# Patient Record
Sex: Male | Born: 1950 | Race: Black or African American | Hispanic: No | Marital: Married | Smoking: Never smoker
Health system: Southern US, Community
[De-identification: ages and names within clinical notes are randomized; demographics above are authoritative.]

## PROBLEM LIST (undated history)

## (undated) DIAGNOSIS — K219 Gastro-esophageal reflux disease without esophagitis: Secondary | ICD-10-CM

## (undated) DIAGNOSIS — M549 Dorsalgia, unspecified: Secondary | ICD-10-CM

## (undated) DIAGNOSIS — H269 Unspecified cataract: Secondary | ICD-10-CM

## (undated) DIAGNOSIS — G4730 Sleep apnea, unspecified: Secondary | ICD-10-CM

## (undated) DIAGNOSIS — F528 Other sexual dysfunction not due to a substance or known physiological condition: Secondary | ICD-10-CM

## (undated) DIAGNOSIS — Z9189 Other specified personal risk factors, not elsewhere classified: Secondary | ICD-10-CM

## (undated) DIAGNOSIS — T7840XA Allergy, unspecified, initial encounter: Secondary | ICD-10-CM

## (undated) DIAGNOSIS — N400 Enlarged prostate without lower urinary tract symptoms: Secondary | ICD-10-CM

## (undated) DIAGNOSIS — G709 Myoneural disorder, unspecified: Secondary | ICD-10-CM

## (undated) DIAGNOSIS — M129 Arthropathy, unspecified: Secondary | ICD-10-CM

## (undated) DIAGNOSIS — I10 Essential (primary) hypertension: Secondary | ICD-10-CM

## (undated) DIAGNOSIS — M1710 Unilateral primary osteoarthritis, unspecified knee: Secondary | ICD-10-CM

## (undated) DIAGNOSIS — J309 Allergic rhinitis, unspecified: Secondary | ICD-10-CM

## (undated) DIAGNOSIS — J019 Acute sinusitis, unspecified: Secondary | ICD-10-CM

## (undated) DIAGNOSIS — J029 Acute pharyngitis, unspecified: Secondary | ICD-10-CM

## (undated) DIAGNOSIS — E785 Hyperlipidemia, unspecified: Secondary | ICD-10-CM

## (undated) DIAGNOSIS — M542 Cervicalgia: Secondary | ICD-10-CM

## (undated) HISTORY — DX: Myoneural disorder, unspecified: G70.9

## (undated) HISTORY — DX: Allergy, unspecified, initial encounter: T78.40XA

## (undated) HISTORY — DX: Gastro-esophageal reflux disease without esophagitis: K21.9

## (undated) HISTORY — PX: UPPER GASTROINTESTINAL ENDOSCOPY: SHX188

## (undated) HISTORY — DX: Acute pharyngitis, unspecified: J02.9

## (undated) HISTORY — DX: Unspecified cataract: H26.9

## (undated) HISTORY — DX: Dorsalgia, unspecified: M54.9

## (undated) HISTORY — DX: Cervicalgia: M54.2

## (undated) HISTORY — DX: Essential (primary) hypertension: I10

## (undated) HISTORY — DX: Unilateral primary osteoarthritis, unspecified knee: M17.10

## (undated) HISTORY — DX: Allergic rhinitis, unspecified: J30.9

## (undated) HISTORY — DX: Sleep apnea, unspecified: G47.30

## (undated) HISTORY — DX: Hyperlipidemia, unspecified: E78.5

## (undated) HISTORY — PX: COLONOSCOPY: SHX174

## (undated) HISTORY — DX: Other sexual dysfunction not due to a substance or known physiological condition: F52.8

## (undated) HISTORY — DX: Benign prostatic hyperplasia without lower urinary tract symptoms: N40.0

## (undated) HISTORY — DX: Other specified personal risk factors, not elsewhere classified: Z91.89

## (undated) HISTORY — PX: POLYPECTOMY: SHX149

## (undated) HISTORY — DX: Arthropathy, unspecified: M12.9

## (undated) HISTORY — DX: Acute sinusitis, unspecified: J01.90

---

## 1992-03-07 HISTORY — PX: OTHER SURGICAL HISTORY: SHX169

## 2002-03-07 HISTORY — PX: OTHER SURGICAL HISTORY: SHX169

## 2004-03-07 HISTORY — PX: GASTRIC BYPASS: SHX52

## 2006-03-07 HISTORY — PX: OTHER SURGICAL HISTORY: SHX169

## 2006-06-06 LAB — HM COLONOSCOPY: HM Colonoscopy: NORMAL

## 2007-08-07 ENCOUNTER — Ambulatory Visit: Payer: Self-pay | Admitting: Internal Medicine

## 2007-08-07 DIAGNOSIS — Z9189 Other specified personal risk factors, not elsewhere classified: Secondary | ICD-10-CM

## 2007-08-07 DIAGNOSIS — J309 Allergic rhinitis, unspecified: Secondary | ICD-10-CM

## 2007-08-07 DIAGNOSIS — M129 Arthropathy, unspecified: Secondary | ICD-10-CM | POA: Insufficient documentation

## 2007-08-07 DIAGNOSIS — E785 Hyperlipidemia, unspecified: Secondary | ICD-10-CM

## 2007-08-07 DIAGNOSIS — I10 Essential (primary) hypertension: Secondary | ICD-10-CM

## 2007-08-07 HISTORY — DX: Essential (primary) hypertension: I10

## 2007-08-07 HISTORY — DX: Arthropathy, unspecified: M12.9

## 2007-08-07 HISTORY — DX: Hyperlipidemia, unspecified: E78.5

## 2007-08-07 HISTORY — DX: Allergic rhinitis, unspecified: J30.9

## 2007-08-07 HISTORY — DX: Other specified personal risk factors, not elsewhere classified: Z91.89

## 2008-02-18 ENCOUNTER — Ambulatory Visit: Payer: Self-pay | Admitting: Internal Medicine

## 2008-02-18 DIAGNOSIS — F528 Other sexual dysfunction not due to a substance or known physiological condition: Secondary | ICD-10-CM

## 2008-02-18 HISTORY — DX: Other sexual dysfunction not due to a substance or known physiological condition: F52.8

## 2008-03-24 ENCOUNTER — Telehealth: Payer: Self-pay | Admitting: Internal Medicine

## 2008-05-20 ENCOUNTER — Ambulatory Visit: Payer: Self-pay | Admitting: Internal Medicine

## 2008-05-20 DIAGNOSIS — N400 Enlarged prostate without lower urinary tract symptoms: Secondary | ICD-10-CM

## 2008-05-20 HISTORY — DX: Benign prostatic hyperplasia without lower urinary tract symptoms: N40.0

## 2008-06-12 ENCOUNTER — Encounter: Payer: Self-pay | Admitting: Internal Medicine

## 2008-10-15 ENCOUNTER — Telehealth: Payer: Self-pay | Admitting: Internal Medicine

## 2009-01-02 ENCOUNTER — Ambulatory Visit: Payer: Self-pay | Admitting: Internal Medicine

## 2009-01-02 ENCOUNTER — Encounter (INDEPENDENT_AMBULATORY_CARE_PROVIDER_SITE_OTHER): Payer: Self-pay | Admitting: *Deleted

## 2009-01-02 LAB — CONVERTED CEMR LAB
ALT: 17 units/L (ref 0–53)
AST: 21 units/L (ref 0–37)
Alkaline Phosphatase: 65 units/L (ref 39–117)
Basophils Relative: 1.2 % (ref 0.0–3.0)
Bilirubin, Direct: 0.1 mg/dL (ref 0.0–0.3)
Chloride: 101 meq/L (ref 96–112)
Creatinine, Ser: 0.9 mg/dL (ref 0.4–1.5)
Eosinophils Relative: 1.9 % (ref 0.0–5.0)
Leukocytes, UA: NEGATIVE
Lymphocytes Relative: 17 % (ref 12.0–46.0)
MCV: 92.3 fL (ref 78.0–100.0)
Monocytes Absolute: 0.5 10*3/uL (ref 0.1–1.0)
Neutrophils Relative %: 71.8 % (ref 43.0–77.0)
Nitrite: NEGATIVE
Potassium: 4 meq/L (ref 3.5–5.1)
RBC: 5.09 M/uL (ref 4.22–5.81)
Specific Gravity, Urine: 1.02 (ref 1.000–1.030)
TSH: 1.07 microintl units/mL (ref 0.35–5.50)
Total Protein: 6.9 g/dL (ref 6.0–8.3)
WBC: 6.5 10*3/uL (ref 4.5–10.5)
pH: 6.5 (ref 5.0–8.0)

## 2009-01-26 ENCOUNTER — Ambulatory Visit: Payer: Self-pay | Admitting: Internal Medicine

## 2009-01-26 DIAGNOSIS — J029 Acute pharyngitis, unspecified: Secondary | ICD-10-CM

## 2009-01-26 HISTORY — DX: Acute pharyngitis, unspecified: J02.9

## 2009-01-30 ENCOUNTER — Telehealth: Payer: Self-pay | Admitting: Internal Medicine

## 2009-04-07 ENCOUNTER — Telehealth: Payer: Self-pay | Admitting: Internal Medicine

## 2009-08-28 ENCOUNTER — Telehealth: Payer: Self-pay | Admitting: Internal Medicine

## 2009-09-15 ENCOUNTER — Ambulatory Visit: Payer: Self-pay | Admitting: Internal Medicine

## 2009-09-15 DIAGNOSIS — M549 Dorsalgia, unspecified: Secondary | ICD-10-CM | POA: Insufficient documentation

## 2009-09-15 DIAGNOSIS — M542 Cervicalgia: Secondary | ICD-10-CM

## 2009-09-15 DIAGNOSIS — IMO0002 Reserved for concepts with insufficient information to code with codable children: Secondary | ICD-10-CM

## 2009-09-15 DIAGNOSIS — M1710 Unilateral primary osteoarthritis, unspecified knee: Secondary | ICD-10-CM

## 2009-09-15 HISTORY — DX: Cervicalgia: M54.2

## 2009-09-15 HISTORY — DX: Reserved for concepts with insufficient information to code with codable children: IMO0002

## 2009-09-15 HISTORY — DX: Dorsalgia, unspecified: M54.9

## 2009-12-11 ENCOUNTER — Ambulatory Visit: Payer: Self-pay | Admitting: Internal Medicine

## 2009-12-11 LAB — CONVERTED CEMR LAB
ALT: 20 units/L (ref 0–53)
AST: 22 units/L (ref 0–37)
Alkaline Phosphatase: 56 units/L (ref 39–117)
BUN: 16 mg/dL (ref 6–23)
Basophils Relative: 0.3 % (ref 0.0–3.0)
Bilirubin Urine: NEGATIVE
Chloride: 108 meq/L (ref 96–112)
Eosinophils Absolute: 0.1 10*3/uL (ref 0.0–0.7)
Eosinophils Relative: 2.9 % (ref 0.0–5.0)
Glucose, Bld: 73 mg/dL (ref 70–99)
HDL: 46.4 mg/dL (ref >39.00)
Hemoglobin, Urine: NEGATIVE
Leukocytes, UA: NEGATIVE
Lymphocytes Relative: 22.8 % (ref 12.0–46.0)
Neutrophils Relative %: 64.3 % (ref 43.0–77.0)
Nitrite: NEGATIVE
PSA: 0.5 ng/mL (ref 0.10–4.00)
Platelets: 181 10*3/uL (ref 150.0–400.0)
Potassium: 4.2 meq/L (ref 3.5–5.1)
RBC: 4.63 M/uL (ref 4.22–5.81)
Sodium: 144 meq/L (ref 135–145)
TSH: 0.62 microintl units/mL (ref 0.35–5.50)
Total Bilirubin: 0.8 mg/dL (ref 0.3–1.2)
Total CHOL/HDL Ratio: 4
Total Protein, Urine: NEGATIVE mg/dL
Urobilinogen, UA: 0.2 (ref 0.0–1.0)
VLDL: 14.2 mg/dL (ref 0.0–40.0)
WBC: 4.9 10*3/uL (ref 4.5–10.5)

## 2009-12-18 ENCOUNTER — Ambulatory Visit: Payer: Self-pay | Admitting: Internal Medicine

## 2009-12-18 ENCOUNTER — Encounter: Payer: Self-pay | Admitting: Internal Medicine

## 2010-01-01 ENCOUNTER — Telehealth: Payer: Self-pay | Admitting: Internal Medicine

## 2010-04-06 NOTE — Progress Notes (Signed)
Summary: Rx req  Phone Note Refill Request Message from:  Fax from Pharmacy on January 01, 2010 4:17 PM  Refills Requested: Medication #1:  ZOCOR 40 MG TABS 1 by mouth once daily   Dosage confirmed as above?Dosage Confirmed   Supply Requested: 9 months Original Rx was not recieved   Method Requested: Electronic Initial call taken by: Margaret Pyle, CMA,  January 01, 2010 4:17 PM    Prescriptions: ZOCOR 40 MG TABS (SIMVASTATIN) 1 by mouth once daily  #90 x 2   Entered by:   Margaret Pyle, CMA   Authorized by:   Corwin Levins MD   Signed by:   Margaret Pyle, CMA on 01/01/2010   Method used:   Faxed to ...       [facility]       [address] [place], [place]  [address]       Ph: [phone]       Fax: [phone]   RxID:   [id number]

## 2010-04-06 NOTE — Assessment & Plan Note (Signed)
Summary: Joshua Burnett  #   Vital Signs:  Patient profile:   60 year old male Height:      71.5 inches Weight:      238 pounds BMI:     32.85 O2 Sat:      94 % on Room air Temp:     97.1 degrees F oral Pulse rate:   72 / minute BP sitting:   134 / 76  (left arm) Cuff size:   large  Vitals Entered By: Zella Ball Ewing CMA (AAMA) (December 18, 2009 1:11 PM)  O2 Flow:  Room air  Preventive Care Screening     declines tetanus  CC: Adult Physical/RE   Primary Care Provider:  Corwin Levins MD  CC:  Adult Physical/RE.  History of Present Illness: here for wellness; overall doing well;  Pt denies CP, worsening sob, doe, wheezing, orthopnea, pnd, worsening LE edema, palps, dizziness or syncope  Pt denies new neuro symptoms such as headache, facial or extremity weakness  No fever, wt loss, night sweats, loss of appetite or other constitutional symptoms  Pt denies polydipsia, polyuria.  Overall good compliance with meds, trying to follow low chol diet, wt stable, little excercise however .  Wants flu shot.    Preventive Screening-Counseling & Management      Drug Use:  no.    Problems Prior to Update: 1)  Neck Pain, Right  (ICD-723.1) 2)  Back Pain  (ICD-724.5) 3)  Osteoarthritis, Knees, Bilateral  (ICD-715.96) 4)  Acute Pharyngitis  (ICD-462) 5)  Benign Prostatic Hypertrophy  (ICD-600.00) 6)  Erectile Dysfunction  (ICD-302.72) 7)  Preventive Health Care  (ICD-V70.0) 8)  Eating Disorder, Hx of  (ICD-V15.89) 9)  Arthritis  (ICD-716.90) 10)  Hypertension  (ICD-401.9) 11)  Hyperlipidemia  (ICD-272.4) 12)  Allergic Rhinitis  (ICD-477.9)  Medications Prior to Update: 1)  Clarinex 5 Mg Tabs (Desloratadine) .Marland Kitchen.. 1po Once Daily 2)  Flintstones Complete   Chew (Pediatric Multivit-Minerals-C) .Marland Kitchen.. 1 By Mouth Qd 3)  Tylenol Arthritis Pain 650 Mg  Tbcr (Acetaminophen) .... 2 Tabs By Mouth Qd 4)  Cialis 20 Mg  Tabs (Tadalafil) .Marland Kitchen.. 1 By Mouth Daily As Needed Once For Erectile Dysfunction 5)   Ambien Cr 12.5 Mg  Tbcr (Zolpidem Tartrate) .Marland Kitchen.. 1 By Mouth As Needed Qhs 6)  Adult Aspirin Ec Low Strength 81 Mg  Tbec (Aspirin) .Marland Kitchen.. 1po Once Daily 7)  Mucinex Dm 30-600 Mg Xr12h-Tab (Dextromethorphan-Guaifenesin) .... Use Prn 8)  Robitussin Night Cgh/cold/flu 2.5-1-5-160 Mg/70ml Susp (Phenyleph-Cpm-Dm-Apap) .... As Needed 9)  Zocor 40 Mg Tabs (Simvastatin) .Marland Kitchen.. 1 By Mouth Once Daily 10)  Tramadol Hcl 50 Mg Tabs (Tramadol Hcl) .Marland Kitchen.. 1p By Mouth Q 6 Hrs As Needed Pain 11)  Flexeril 5 Mg Tabs (Cyclobenzaprine Hcl) .Marland Kitchen.. 1 By Mouth Three Times A Day As Needed  Current Medications (verified): 1)  Clarinex 5 Mg Tabs (Desloratadine) .Marland Kitchen.. 1po Once Daily 2)  Flintstones Complete   Chew (Pediatric Multivit-Minerals-C) .Marland Kitchen.. 1 By Mouth Qd 3)  Tylenol Arthritis Pain 650 Mg  Tbcr (Acetaminophen) .... 2 Tabs By Mouth Qd 4)  Cialis 20 Mg  Tabs (Tadalafil) .Marland Kitchen.. 1 By Mouth Daily As Needed Once For Erectile Dysfunction 5)  Ambien Cr 12.5 Mg  Tbcr (Zolpidem Tartrate) .Marland Kitchen.. 1 By Mouth As Needed Qhs 6)  Adult Aspirin Ec Low Strength 81 Mg  Tbec (Aspirin) .Marland Kitchen.. 1po Once Daily 7)  Mucinex Dm 30-600 Mg Xr12h-Tab (Dextromethorphan-Guaifenesin) .... Use Prn 8)  Robitussin Night Cgh/cold/flu 2.5-1-5-160 Mg/1ml Susp (Phenyleph-Cpm-Dm-Apap) .Marland KitchenMarland KitchenMarland Kitchen  As Needed 9)  Zocor 40 Mg Tabs (Simvastatin) .Marland Kitchen.. 1 By Mouth Once Daily 10)  Tramadol Hcl 50 Mg Tabs (Tramadol Hcl) .Marland Kitchen.. 1p By Mouth Q 6 Hrs As Needed Pain 11)  Flexeril 5 Mg Tabs (Cyclobenzaprine Hcl) .Marland Kitchen.. 1 By Mouth Three Times A Day As Needed  Allergies (verified): 1)  ! 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 Allergic rhinitis Hyperlipidemia Hypertension DJD hx of OSA - no tx now lumbar disc disease E.D. 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 father with elev chol, heart disease, HTN mother with DJD  Social History: Reviewed history from 08/07/2007 and no changes required. 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         all otherwise negative per pt -    Physical Exam  General:  alert and overweight-appearing.   Head:  normocephalic and atraumatic.   Eyes:  vision grossly intact, pupils equal, and pupils round.   Ears:  R ear normal and L ear normal.   Nose:  no external deformity and no nasal discharge.   Mouth:  no gingival abnormalities and pharynx pink and moist.   Neck:  supple and no masses.   Lungs:  normal respiratory effort and normal breath sounds.   Heart:  normal rate and regular rhythm.   Abdomen:  soft, non-tender, and normal bowel sounds.   Msk:  no joint tenderness and no joint swelling.   Extremities:  no edema, no erythema  Neurologic:  cranial nerves II-XII intact, strength normal in all extremities, sensation intact to pinprick, gait normal, and DTRs  symmetrical and normal.   Skin:  color normal and no rashes.   Psych:  not anxious appearing and not depressed appearing.     Impression & Recommendations:  Problem # 1:  Preventive Health Care (ICD-V70.0) Overall doing well, age appropriate education and counseling updated, referral for preventive services and immunizations addressed, dietary counseling and smoking status adressed , most recent labs reviewed with pt, ecg reviewed Orders: EKG w/ Interpretation (93000) I have personally reviewed and have noted 1.   The patient's medical and social history 2.   Their use of alcohol, tobacco or illicit drugs 3.   Their current medications and supplements 4.   The patient's functional ability including ADL's, fall risks, home safety risks and hearing or visual             impairment. 5.   Diet and physical activities 6.   Evidence for depression or mood disorders  The patients weight, height, BMI have been recorded in the chart I have made  referrals, counseling and provided education to the patient based review of the above  Problem # 2:  HYPERLIPIDEMIA (ICD-272.4)  His updated medication list for this problem includes:    Zocor 40 Mg Tabs (Simvastatin) .Marland Kitchen... 1 by mouth once daily  Labs Reviewed: SGOT: 22 (12/11/2009)   SGPT: 20 (12/11/2009)   HDL:46.40 (12/11/2009), 51.40 (01/02/2009)  LDL:103 (12/11/2009)  Chol:164 (12/11/2009), 220 (01/02/2009)  Trig:71.0 (12/11/2009), 83.0 (01/02/2009) stable overall by hx and exam, ok to continue meds/tx as is , to follow better lower chol diet  Complete Medication List: 1)  Clarinex 5 Mg Tabs (Desloratadine) .Marland Kitchen.. 1po once daily 2)  Flintstones Complete Chew (Pediatric multivit-minerals-c) .Marland Kitchen.. 1 by mouth qd 3)  Tylenol Arthritis Pain 650 Mg Tbcr (Acetaminophen) .... 2 tabs by mouth qd 4)  Cialis 20 Mg Tabs (Tadalafil) .Marland Kitchen.. 1 by mouth daily as needed once for erectile dysfunction 5)  Ambien Cr 12.5 Mg Tbcr (Zolpidem tartrate) .Marland Kitchen.. 1 by mouth as  needed qhs 6)  Adult Aspirin Ec Low Strength 81 Mg Tbec (Aspirin) .Marland Kitchen.. 1po once daily 7)  Mucinex Dm 30-600 Mg Xr12h-tab (Dextromethorphan-guaifenesin) .... Use prn 8)  Robitussin Night Cgh/cold/flu 2.5-1-5-160 Mg/41ml Susp (Phenyleph-cpm-dm-apap) .... As needed 9)  Zocor 40 Mg Tabs (Simvastatin) .Marland Kitchen.. 1 by mouth once daily 10)  Tramadol Hcl 50 Mg Tabs (Tramadol hcl) .Marland Kitchen.. 1p by mouth q 6 hrs as needed pain 11)  Flexeril 5 Mg Tabs (Cyclobenzaprine hcl) .Marland Kitchen.. 1 by mouth three times a day as needed  Other Orders: Admin 1st Vaccine (16109) Flu Vaccine 63yrs + [phone])  Patient Instructions: 1)  you had the flu shot today 2)  you had the tetanus shot today 3)  Continue all previous medications as before this visit  4)  Please follow low cholesterol diet 5)  Please schedule a follow-up appointment in 1 year, or sooner if needed   Flu Vaccine Consent Questions     Do you have a history of severe allergic reactions to this vaccine? no    Any prior history of allergic reactions to egg and/or gelatin? no    Do you have a sensitivity to the preservative Thimersol? no    Do you have a past history of Guillan-Barre Syndrome? no    Do you currently have an acute febrile illness? no    Have you ever had a severe reaction to latex? no    Vaccine information given and explained to patient? yes    Are you currently pregnant? no    Lot Number:[id number]   Exp Date:09/04/2010   Site Given  Left Deltoid [id number]

## 2010-04-06 NOTE — Progress Notes (Signed)
  Phone Note Refill Request Message from:  Fax from Pharmacy on August 28, 2009 9:42 AM  Refills Requested: Medication #1:  CLARINEX 5 MG TABS 1po once daily   Dosage confirmed as above?Dosage Confirmed   Notes: [facility] Initial call taken by: Robin Ewing CMA Duncan Dull),  August 28, 2009 9:42 AM    Prescriptions: CLARINEX 5 MG TABS (DESLORATADINE) 1po once daily  #90 x 0   Entered by:   Scharlene Gloss CMA (AAMA)   Authorized by:   Corwin Levins MD   Signed by:   Scharlene Gloss CMA (AAMA) on 08/28/2009   Method used:   Faxed to ...       [facility]       [address] [place], [place]  [address]       Ph: [phone]       Fax: [phone]   RxID:   [id number]

## 2010-04-06 NOTE — Assessment & Plan Note (Signed)
Summary: BACK PROBLEMS/NWS  #   Vital Signs:  Patient profile:   60 year old male Height:      71.5 inches Weight:      232.50 pounds BMI:     32.09 O2 Sat:      94 % on Room air Temp:     98.4 degrees F oral Pulse rate:   67 / minute BP sitting:   122 / 78  (left arm) Cuff size:   large  Vitals Entered By: Zella Ball Ewing CMA Duncan Dull) (September 15, 2009 2:27 PM)  O2 Flow:  Room air CC: Back problems, shoulder and neck stiffness/RE   Primary Care Provider:  Corwin Levins MD  CC:  Back problems and shoulder and neck stiffness/RE.  History of Present Illness: here to f/u;  has hx of back surgury lumbar, and known other slight bulging disc per pt -= ? l5; overall worse in the last 2 mo, usually low lubmar just to the right center; usually mild to mod, intermittent;  often occurs worse to first get up in the am, seems better after about ,  no LE pain, weak,  numb;  no change in bowel or bladder, and no gait change, no falls, fever, wt loss, night sweats or injury.    also with stiffness to the right neck for 5 months; not present ot look forward, but turning horiz to the left as well touching right ear to shoulder makes it worse;  no RUE pain, weak, numb; no fall or injury o/w.    also c/o right distal foot pain at the first mtp to flexion under pressure (leaning forward with wt on the first mtp);  no injury, swelling or redness, mild to mod , intermittent for 3 to 4 months, has bunion at the site but no tenderness  also with left dorsal foot pain just prox to the first mtp, wihtout sweling, rednes, injury or truama. N  and left knee stiffness worse with pain to pivot one month ago, gradually better over one mo, but still somewhat discomfort;  he is unaware of specific swelling;  no falls or injury.    no fever, hx of gout. Pt denies CP, sob, doe, wheezing, orthopnea, pnd, worsening LE edema, palps, dizziness or syncope  Pt denies new neuro symptoms such as headache, facial or extremity  weakness   Problems Prior to Update: 1)  Back Pain  (ICD-724.5) 2)  Osteoarthritis, Knees, Bilateral  (ICD-715.96) 3)  Acute Pharyngitis  (ICD-462) 4)  Benign Prostatic Hypertrophy  (ICD-600.00) 5)  Erectile Dysfunction  (ICD-302.72) 6)  Preventive Health Care  (ICD-V70.0) 7)  Eating Disorder, Hx of  (ICD-V15.89) 8)  Arthritis  (ICD-716.90) 9)  Hypertension  (ICD-401.9) 10)  Hyperlipidemia  (ICD-272.4) 11)  Allergic Rhinitis  (ICD-477.9)  Medications Prior to Update: 1)  Clarinex 5 Mg Tabs (Desloratadine) .Marland Kitchen.. 1po Once Daily 2)  Flintstones Complete   Chew (Pediatric Multivit-Minerals-C) .Marland Kitchen.. 1 By Mouth Qd 3)  Tylenol Arthritis Pain 650 Mg  Tbcr (Acetaminophen) .... 2 Tabs By Mouth Qd 4)  Cialis 20 Mg  Tabs (Tadalafil) .Marland Kitchen.. 1 By Mouth Daily As Needed Once For Erectile Dysfunction 5)  Ambien Cr 12.5 Mg  Tbcr (Zolpidem Tartrate) .Marland Kitchen.. 1 By Mouth As Needed Qhs 6)  Adult Aspirin Ec Low Strength 81 Mg  Tbec (Aspirin) .Marland Kitchen.. 1po Once Daily 7)  Promethazine Hcl 25 Mg Tabs (Promethazine Hcl) .Marland Kitchen.. 1po Q 6 Hrs As Needed Nausea 8)  Mucinex Dm 30-600 Mg Xr12h-Tab (Dextromethorphan-Guaifenesin) .Marland KitchenMarland KitchenMarland Kitchen  Use Prn 9)  Robitussin Night Cgh/cold/flu 2.5-1-5-160 Mg/27ml Susp (Phenyleph-Cpm-Dm-Apap) .... As Needed 10)  Zocor 40 Mg Tabs (Simvastatin) .Marland Kitchen.. 1 By Mouth Once Daily  Current Medications (verified): 1)  Clarinex 5 Mg Tabs (Desloratadine) .Marland Kitchen.. 1po Once Daily 2)  Flintstones Complete   Chew (Pediatric Multivit-Minerals-C) .Marland Kitchen.. 1 By Mouth Qd 3)  Tylenol Arthritis Pain 650 Mg  Tbcr (Acetaminophen) .... 2 Tabs By Mouth Qd 4)  Cialis 20 Mg  Tabs (Tadalafil) .Marland Kitchen.. 1 By Mouth Daily As Needed Once For Erectile Dysfunction 5)  Ambien Cr 12.5 Mg  Tbcr (Zolpidem Tartrate) .Marland Kitchen.. 1 By Mouth As Needed Qhs 6)  Adult Aspirin Ec Low Strength 81 Mg  Tbec (Aspirin) .Marland Kitchen.. 1po Once Daily 7)  Mucinex Dm 30-600 Mg Xr12h-Tab (Dextromethorphan-Guaifenesin) .... Use Prn 8)  Robitussin Night Cgh/cold/flu 2.5-1-5-160 Mg/25ml Susp  (Phenyleph-Cpm-Dm-Apap) .... As Needed 9)  Zocor 40 Mg Tabs (Simvastatin) .Marland Kitchen.. 1 By Mouth Once Daily 10)  Tramadol Hcl 50 Mg Tabs (Tramadol Hcl) .Marland Kitchen.. 1p By Mouth Q 6 Hrs As Needed Pain 11)  Flexeril 5 Mg Tabs (Cyclobenzaprine Hcl) .Marland Kitchen.. 1 By Mouth Three Times A Day As Needed  Allergies (verified): 1)  ! Ace Inhibitors  Past History:  Past Medical History: Last updated: 01/02/2009 Allergic rhinitis Hyperlipidemia Hypertension DJD hx of OSA - no tx now lumbar disc disease E.D. 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   Head:  normocephalic and atraumatic.   Eyes:  vision grossly intact, pupils equal, and pupils round.   Ears:  R ear normal and L ear normal.   Nose:  no external deformity and no nasal discharge.   Mouth:  no gingival abnormalities and pharynx pink and moist.   Neck:  supple and no masses.   Lungs:  normal respiratory effort and normal breath sounds.   Heart:  normal rate and regular rhythm.   Abdomen:  soft, non-tender, and normal bowel sounds.   Msk:  mild tender left trapezoid;  mild tender right postlat neck musculature wtihout spasm,  spine nontender throughout except for right paravertebral lowest lumbar area and right buttock/SI joint area;  left knee nontender but 1-2+ effusion and severe crepitus, mod crepitus to right knee;  bilat feet without tender/red/swelling;   Extremities:  no edema, no erythema  Neurologic:  cranial nerves II-XII intact, strength normal in  all extremities, sensation intact to pinprick, gait normal, and DTRs symmetrical and normal.   Skin:  color normal and no rashes.   Psych:  moderately anxious.     Impression & Recommendations:  Problem # 1:  OSTEOARTHRITIS, KNEES, BILATERAL (ICD-715.96)  His updated medication list for this problem includes:    Tylenol Arthritis Pain 650 Mg Tbcr (Acetaminophen) .Marland Kitchen... 2 tabs by mouth qd    Adult Aspirin Ec Low Strength 81 Mg Tbec (Aspirin) .Marland Kitchen... 1po once daily    Tramadol Hcl 50 Mg Tabs (Tramadol hcl) .Marland Kitchen... 1p by mouth q 6 hrs as needed pain    Flexeril 5 Mg Tabs (Cyclobenzaprine hcl) .Marland Kitchen... 1 by mouth three times a day as  needed with mod pain and effusion on the left - for tramadol, consider ortho consult if not improved  Problem # 2:  BACK PAIN (ICD-724.5)  His updated medication list for this problem includes:    Tylenol Arthritis Pain 650 Mg Tbcr (Acetaminophen) .Marland Kitchen... 2 tabs by mouth qd    Adult Aspirin Ec Low Strength 81 Mg Tbec (Aspirin) .Marland Kitchen... 1po once daily    Tramadol Hcl 50 Mg Tabs (Tramadol hcl) .Marland Kitchen... 1p by mouth q 6 hrs as needed pain    Flexeril 5 Mg Tabs (Cyclobenzaprine hcl) .Marland Kitchen... 1 by mouth three times a day as needed treat as above, f/u any worsening signs or symptoms ; suspect underlying lower lumbar DJD/DDD   Problem # 3:  NECK PAIN, RIGHT (ICD-723.1)  His updated medication list for this problem includes:    Tylenol Arthritis Pain 650 Mg Tbcr (Acetaminophen) .Marland Kitchen... 2 tabs by mouth qd    Adult Aspirin Ec Low Strength 81 Mg Tbec (Aspirin) .Marland Kitchen... 1po once daily    Tramadol Hcl 50 Mg Tabs (Tramadol hcl) .Marland Kitchen... 1p by mouth q 6 hrs as needed pain    Flexeril 5 Mg Tabs (Cyclobenzaprine hcl) .Marland Kitchen... 1 by mouth three times a day as needed simllar to right lower back - c/w msk strain and/or underlying flare of DJD/DDD, treat as above, f/u any worsening signs or symptoms   Problem # 4:  HYPERTENSION (ICD-401.9)  BP today: 122/78 Prior BP: 118/82 (01/26/2009)  Labs  Reviewed: K+: 4.0 (01/02/2009) Creat: : 0.9 (01/02/2009)   Chol: 220 (01/02/2009)   HDL: 51.40 (01/02/2009)   TG: 83.0 (01/02/2009) stable overall by hx and exam, ok to continue meds/tx as is  - no meds needed at this time, cont diet/low salt  Complete Medication List: 1)  Clarinex 5 Mg Tabs (Desloratadine) .Marland Kitchen.. 1po once daily 2)  Flintstones Complete Chew (Pediatric multivit-minerals-c) .Marland Kitchen.. 1 by mouth qd 3)  Tylenol Arthritis Pain 650 Mg Tbcr (Acetaminophen) .... 2 tabs by mouth qd 4)  Cialis 20 Mg Tabs (Tadalafil) .Marland Kitchen.. 1 by mouth daily as needed once for erectile dysfunction 5)  Ambien Cr 12.5 Mg Tbcr (Zolpidem tartrate) .Marland Kitchen.. 1 by mouth as needed qhs 6)  Adult Aspirin Ec Low Strength 81 Mg Tbec (Aspirin) .Marland Kitchen.. 1po once daily 7)  Mucinex Dm 30-600 Mg Xr12h-tab (Dextromethorphan-guaifenesin) .... Use prn 8)  Robitussin Night Cgh/cold/flu 2.5-1-5-160 Mg/71ml Susp (Phenyleph-cpm-dm-apap) .... As needed 9)  Zocor 40 Mg Tabs (Simvastatin) .Marland Kitchen.. 1 by mouth once daily 10)  Tramadol Hcl 50 Mg Tabs (Tramadol hcl) .Marland Kitchen.. 1p by mouth q 6 hrs as needed pain 11)  Flexeril 5 Mg Tabs (Cyclobenzaprine hcl) .Marland Kitchen.. 1 by mouth three times a day as needed  Patient Instructions: 1)  Please take all new medications as prescribed 2)  Continue all previous medications as before this visit  3)  Please schedule a follow-up appointment in 3 months. with CPX labs Prescriptions: FLEXERIL 5 MG TABS (CYCLOBENZAPRINE HCL) 1 by mouth three times a day as needed  #60 x 1   Entered and Authorized by:   Corwin Levins MD   Signed by:   Corwin Levins MD on 09/15/2009   Method used:   Print then Give to Patient   RxID:   [id number] TRAMADOL HCL 50 MG TABS (TRAMADOL HCL) 1p by mouth q 6 hrs as needed pain  #160 x 2   Entered and Authorized by:   Corwin Levins MD   Signed by:   Len Blalock  Jovee Dettinger MD on 09/15/2009   Method used:   Print then Give to Patient   RxID:   [id number] ZOCOR 40 MG TABS (SIMVASTATIN) 1 by mouth once  daily  #90 x 3   Entered and Authorized by:   Corwin Levins MD   Signed by:   Corwin Levins MD on 09/15/2009   Method used:   Print then Give to Patient   RxID:   [id number] AMBIEN CR 12.5 MG  TBCR (ZOLPIDEM TARTRATE) 1 by mouth as needed qhs  #30 x 5   Entered and Authorized by:   Corwin Levins MD   Signed by:   Corwin Levins MD on 09/15/2009   Method used:   Print then Give to Patient   RxID:   [id number] CIALIS 20 MG  TABS (TADALAFIL) 1 by mouth daily as needed once for erectile dysfunction  #10 x 11   Entered and Authorized by:   Corwin Levins MD   Signed by:   Corwin Levins MD on 09/15/2009   Method used:   Print then Give to Patient   RxID:   [id number] CIALIS 20 MG  TABS (TADALAFIL) 1 by mouth daily as needed once for erectile dysfunction  #5 x 11   Entered and Authorized by:   Corwin Levins MD   Signed by:   Corwin Levins MD on 09/15/2009   Method used:   Print then Give to Patient   RxID:   [id number] CLARINEX 5 MG TABS (DESLORATADINE) 1po once daily  #90 x 3   Entered and Authorized by:   Corwin Levins MD   Signed by:   Corwin Levins MD on 09/15/2009   Method used:   Print then Give to Patient   RxID:   [id number]

## 2010-04-06 NOTE — Progress Notes (Signed)
  Phone Note Refill Request  on April 07, 2009 9:31 AM  Refills Requested: Medication #1:  CIALIS 20 MG  TABS 1 by mouth daily as needed once for erectile dysfunction   Dosage confirmed as above?Dosage Confirmed   Notes: [facility] Initial call taken by: Scharlene Gloss,  April 07, 2009 9:31 AM    Prescriptions: CIALIS 20 MG  TABS (TADALAFIL) 1 by mouth daily as needed once for erectile dysfunction  #10 x 5   Entered by:   Scharlene Gloss   Authorized by:   Corwin Levins MD   Signed by:   Scharlene Gloss on 04/07/2009   Method used:   Faxed to ...       [facility]       [address] [place], [place]  [address]       Ph: [phone]       Fax: [phone]   RxID:   [id number]

## 2010-04-16 ENCOUNTER — Ambulatory Visit (INDEPENDENT_AMBULATORY_CARE_PROVIDER_SITE_OTHER): Payer: 59 | Admitting: Internal Medicine

## 2010-04-16 ENCOUNTER — Encounter: Payer: Self-pay | Admitting: Internal Medicine

## 2010-04-16 DIAGNOSIS — J209 Acute bronchitis, unspecified: Secondary | ICD-10-CM

## 2010-04-16 DIAGNOSIS — I10 Essential (primary) hypertension: Secondary | ICD-10-CM

## 2010-04-22 NOTE — Letter (Signed)
Summary: Out of Work  [facility]  [address] [place], [place] [address]   Phone: [phone]  Fax: [phone]    April 16, 2010   Employee:  Ross Ludwig    To Whom It May Concern:   For Medical reasons, please excuse the above named employee from work for the following dates:  Start:   04/16/10  End:   04/20/10  If you need additional information, please feel free to contact our office.         Sincerely,    Etta Grandchild MD

## 2010-04-22 NOTE — Assessment & Plan Note (Signed)
Summary: DR Sammuel Cooper PT NO SLOT--SORE THROAT-DRY NOSE PASSAGES--STC   Vital Signs:  Patient profile:   60 year old male Height:      71.5 inches Weight:      234 pounds BMI:     32.30 O2 Sat:      97 % on Room air Temp:     98.5 degrees F oral Pulse rate:   78 / minute Pulse rhythm:   regular Resp:     16 per minute BP sitting:   140 / 82  (left arm) Cuff size:   large  Vitals Entered By: Joshua Burnett CMA (April 16, 2010 1:54 PM)  Nutrition Counseling: Patient's BMI is greater than 25 and therefore counseled on weight management options.  O2 Flow:  Room air CC: Patient c/o sinus congestion, pain x 1wk, URI symptoms   Primary Care Provider:  Corwin Levins MD  CC:  Patient c/o sinus congestion, pain x 1wk, and URI symptoms.  History of Present Illness:  URI Symptoms      This is a 60 year old man who presents with URI symptoms.  The symptoms began 2 weeks ago.  The severity is described as mild.  The patient reports nasal congestion, purulent nasal discharge, sore throat, and dry cough, but denies productive cough, earache, and sick contacts.  Associated symptoms include low-grade fever (<100.5 degrees).  The patient denies stiff neck, dyspnea, wheezing, rash, vomiting, diarrhea, use of an antipyretic, and response to antipyretic.  The patient denies itchy throat, sneezing, seasonal symptoms, headache, muscle aches, and severe fatigue.  Risk factors for Strep sinusitis include unilateral facial pain, unilateral nasal discharge, and poor response to decongestant.  The patient denies the following risk factors for Strep sinusitis: double sickening, tooth pain, Strep exposure, tender adenopathy, and absence of cough.    Preventive Screening-Counseling & Management  Alcohol-Tobacco     Alcohol drinks/day: <1     Alcohol type: all     >5/day in last 3 mos: no     Alcohol Counseling: not indicated; use of alcohol is not excessive or problematic     Smoking Status: never     Tobacco  Counseling: not indicated; no tobacco use  Hep-HIV-STD-Contraception     Hepatitis Risk: no risk noted     HIV Risk: no risk noted     STD Risk: no risk noted      Drug Use:  no.    Clinical Review Panels:  Prevention   Last Colonoscopy:  normal (06/06/2006)   Last PSA:  0.50 (12/11/2009)  Immunizations   Last Flu Vaccine:  Fluvax 3+ (12/18/2009)  Lipid Management   Cholesterol:  164 (12/11/2009)   LDL (bad choesterol):  103 (12/11/2009)   HDL (good cholesterol):  46.40 (12/11/2009)  Diabetes Management   Creatinine:  0.7 (12/11/2009)   Last Flu Vaccine:  Fluvax 3+ (12/18/2009)  CBC   WBC:  4.9 (12/11/2009)   RBC:  4.63 (12/11/2009)   Hgb:  14.4 (12/11/2009)   Hct:  42.6 (12/11/2009)   Platelets:  181.0 (12/11/2009)   MCV  92.1 (12/11/2009)   MCHC  33.9 (12/11/2009)   RDW  13.0 (12/11/2009)   PMN:  64.3 (12/11/2009)   Lymphs:  22.8 (12/11/2009)   Monos:  9.7 (12/11/2009)   Eosinophils:  2.9 (12/11/2009)   Basophil:  0.3 (12/11/2009)  Complete Metabolic Panel   Glucose:  73 (12/11/2009)   Sodium:  144 (12/11/2009)   Potassium:  4.2 (12/11/2009)   Chloride:  108 (  12/11/2009)   CO2:  31 (12/11/2009)   BUN:  16 (12/11/2009)   Creatinine:  0.7 (12/11/2009)   Albumin:  3.5 (12/11/2009)   Total Protein:  5.9 (12/11/2009)   Calcium:  9.0 (12/11/2009)   Total Bili:  0.8 (12/11/2009)   Alk Phos:  56 (12/11/2009)   SGPT (ALT):  20 (12/11/2009)   SGOT (AST):  22 (12/11/2009)   Medications Prior to Update: 1)  Clarinex 5 Mg Tabs (Desloratadine) .Marland Kitchen.. 1po Once Daily 2)  Flintstones Complete   Chew (Pediatric Multivit-Minerals-C) .Marland Kitchen.. 1 By Mouth Qd 3)  Tylenol Arthritis Pain 650 Mg  Tbcr (Acetaminophen) .... 2 Tabs By Mouth Qd 4)  Cialis 20 Mg  Tabs (Tadalafil) .Marland Kitchen.. 1 By Mouth Daily As Needed Once For Erectile Dysfunction 5)  Ambien Cr 12.5 Mg  Tbcr (Zolpidem Tartrate) .Marland Kitchen.. 1 By Mouth As Needed Qhs 6)  Adult Aspirin Ec Low Strength 81 Mg  Tbec (Aspirin) .Marland Kitchen.. 1po Once  Daily 7)  Mucinex Dm 30-600 Mg Xr12h-Tab (Dextromethorphan-Guaifenesin) .... Use Prn 8)  Robitussin Night Cgh/cold/flu 2.5-1-5-160 Mg/39ml Susp (Phenyleph-Cpm-Dm-Apap) .... As Needed 9)  Zocor 40 Mg Tabs (Simvastatin) .Marland Kitchen.. 1 By Mouth Once Daily 10)  Tramadol Hcl 50 Mg Tabs (Tramadol Hcl) .Marland Kitchen.. 1p By Mouth Q 6 Hrs As Needed Pain 11)  Flexeril 5 Mg Tabs (Cyclobenzaprine Hcl) .Marland Kitchen.. 1 By Mouth Three Times A Day As Needed  Current Medications (verified): 1)  Clarinex 5 Mg Tabs (Desloratadine) .Marland Kitchen.. 1po Once Daily 2)  Flintstones Complete   Chew (Pediatric Multivit-Minerals-C) .Marland Kitchen.. 1 By Mouth Qd 3)  Tylenol Arthritis Pain 650 Mg  Tbcr (Acetaminophen) .... 2 Tabs By Mouth Qd 4)  Cialis 20 Mg  Tabs (Tadalafil) .Marland Kitchen.. 1 By Mouth Daily As Needed Once For Erectile Dysfunction 5)  Ambien Cr 12.5 Mg  Tbcr (Zolpidem Tartrate) .Marland Kitchen.. 1 By Mouth As Needed Qhs 6)  Adult Aspirin Ec Low Strength 81 Mg  Tbec (Aspirin) .Marland Kitchen.. 1po Once Daily 7)  Zocor 40 Mg Tabs (Simvastatin) .Marland Kitchen.. 1 By Mouth Once Daily 8)  Tramadol Hcl 50 Mg Tabs (Tramadol Hcl) .Marland Kitchen.. 1p By Mouth Q 6 Hrs As Needed Pain 9)  Flexeril 5 Mg Tabs (Cyclobenzaprine Hcl) .Marland Kitchen.. 1 By Mouth Three Times A Day As Needed 10)  Zutripro 60-4-5 Mg/64ml Soln (Pseudoeph-Chlorphen-Hydrocod) .Marland Kitchen.. 1 Tsp By Mouth Every 8 Hours As Needed For Cough and Congestion 11)  Ceftin 500 Mg Tabs (Cefuroxime Axetil) .... One By Mouth Two Times A Day For 10 Days  Allergies (verified): 1)  ! Ace Inhibitors  Past History:  Past Medical History: Last updated: 01/02/2009 Allergic rhinitis Hyperlipidemia Hypertension DJD hx of OSA - no tx now lumbar disc disease E.D. 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 father with elev chol, heart disease, HTN mother with DJD  Social History: Reviewed history from 12/18/2009 and no changes required. 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    Physical Exam  General:  alert, well-developed, well-nourished, well-hydrated, appropriate dress, normal appearance, and healthy-appearing.   Head:  normocephalic, atraumatic, no abnormalities observed, and no abnormalities palpated.   Eyes:  vision grossly intact and pupils equal.   Ears:  R ear normal and L ear normal.   Nose:  no external deformity, no mucosal pallor, no mucosal edema, no airflow obstruction, no intranasal foreign body, no nasal polyps, no nasal mucosal lesions, no mucosal friability, no active bleeding or clots, no sinus percussion tenderness, no septum abnormalities, and nasal dischargemucosal pallor.   Mouth:  good dentition, pharynx pink and moist, no erythema, no exudates, no posterior lymphoid hypertrophy, no postnasal drip, no pharyngeal crowing, no lesions, no aphthous ulcers, no erosions, no tongue abnormalities, no leukoplakia, and no petechiae.   Neck:   supple, full ROM, no masses, no thyromegaly, no thyroid nodules or tenderness, no JVD, normal carotid upstroke, no carotid bruits, no cervical lymphadenopathy, and no neck tenderness.   Lungs:  normal respiratory effort, no intercostal retractions, no accessory muscle use, normal breath sounds, no dullness, no fremitus, no crackles, and no wheezes.   Heart:  normal rate, regular rhythm, no murmur, no gallop, no rub, and no JVD.   Abdomen:  Bowel sounds positive,abdomen soft and non-tender without masses, organomegaly or hernias noted. Msk:  No deformity or scoliosis noted of thoracic or lumbar spine.   Pulses:  R and L carotid,radial,femoral,dorsalis pedis and posterior tibial pulses are full and equal bilaterally Extremities:  No clubbing, cyanosis, edema, or deformity noted with normal full range of motion of all joints.   Neurologic:  No cranial nerve deficits noted. Station and gait are normal. Plantar reflexes are down-going bilaterally. DTRs are symmetrical throughout. Sensory, motor and coordinative functions appear intact. Skin:  Intact without suspicious lesions or rashes Cervical Nodes:  no anterior cervical adenopathy and no posterior cervical adenopathy.   Axillary Nodes:  no R axillary adenopathy and no L axillary adenopathy.   Psych:  Cognition and judgment appear intact. Alert and cooperative with normal attention span and concentration. No apparent delusions, illusions, hallucinations   Impression & Recommendations:  Problem # 1:  BRONCHITIS-ACUTE (ICD-466.0) Assessment New  The following medications were removed from the medication list:    Mucinex Dm 30-600 Mg Xr12h-tab (Dextromethorphan-guaifenesin) ..... Use prn    Robitussin Night Cgh/cold/flu 2.5-1-5-160 Mg/59ml Susp (Phenyleph-cpm-dm-apap) .Marland Kitchen... As needed His updated medication list for this problem includes:    Zutripro 60-4-5 Mg/48ml Soln (Pseudoeph-chlorphen-hydrocod) .Marland Kitchen... 1 tsp by mouth every 8 hours as needed for  cough and congestion    Ceftin 500 Mg Tabs (Cefuroxime axetil) ..... One by mouth two times a day for 10 days  Take antibiotics and other medications as directed. Encouraged to push clear liquids, get enough rest, and take acetaminophen as needed. To be seen in 5-7 days if no improvement, sooner if worse.  Problem # 2:  HYPERTENSION (ICD-401.9) Assessment: Unchanged  BP today: 140/82 Prior BP: 134/76 (12/18/2009)  Labs Reviewed: K+: 4.2 (12/11/2009) Creat: : 0.7 (12/11/2009)   Chol: 164 (12/11/2009)   HDL: 46.40 (12/11/2009)   LDL: 103 (12/11/2009)   TG: 71.0 (12/11/2009)  Complete Medication List: 1)  Clarinex 5 Mg Tabs (Desloratadine) .Marland Kitchen.. 1po once daily 2)  Flintstones Complete Chew (Pediatric multivit-minerals-c) .Marland Kitchen.. 1 by mouth qd 3)  Tylenol Arthritis Pain 650 Mg Tbcr (Acetaminophen) .... 2 tabs by mouth qd 4)  Cialis 20 Mg Tabs (Tadalafil) .Marland Kitchen.. 1 by mouth daily as needed once for erectile dysfunction 5)  Ambien Cr 12.5 Mg Tbcr (Zolpidem tartrate) .Marland Kitchen.. 1 by mouth as needed qhs 6)  Adult Aspirin Ec Low Strength 81 Mg Tbec (Aspirin) .Marland Kitchen.. 1po once daily 7)  Zocor 40 Mg Tabs (Simvastatin) .Marland Kitchen.. 1 by mouth once daily 8)  Tramadol Hcl 50 Mg Tabs (Tramadol hcl) .Marland Kitchen.. 1p by mouth q 6 hrs as needed pain 9)  Flexeril 5 Mg Tabs (Cyclobenzaprine hcl) .Marland Kitchen.. 1 by mouth three times a day as needed 10)  Zutripro 60-4-5 Mg/38ml Soln (Pseudoeph-chlorphen-hydrocod) .Marland Kitchen.. 1 tsp by mouth every 8 hours as needed for cough and congestion 11)  Ceftin 500 Mg Tabs (Cefuroxime axetil) .... One by mouth two times a day for 10 days  Patient Instructions: 1)  Please schedule a follow-up appointment in 2 weeks. 2)  Take your antibiotic as prescribed until ALL of it is gone, but stop if you develop a rash or swelling and contact our office as soon as possible. 3)  Acute sinusitis symptoms for less than 10 days are not helped by antibiotics.Use warm moist compresses, and over the counter decongestants ( only as  directed). Call if no improvement in 5-7 days, sooner if increasing pain, fever, or new symptoms. 4)  Check your Blood Pressure regularly. If it is above 140/90: you should make an appointment. Prescriptions: CEFTIN 500 MG TABS (CEFUROXIME AXETIL) One by mouth two times a day for 10 days  #20 x 1   Entered and Authorized by:   Etta Grandchild MD   Signed by:   Etta Grandchild MD on 04/16/2010   Method used:   Electronically to        [facility] [phone]* (retail)       [address] [place], [place]  [address]       Ph: [phone]       Fax: [phone]   RxID:   [id number] ZUTRIPRO 60-4-5 MG/5ML SOLN (PSEUDOEPH-CHLORPHEN-HYDROCOD) 1 tsp by mouth every 8 hours as needed for cough and congestion  #60 ml x 0   Entered and Authorized by:   Etta Grandchild MD   Signed by:   Etta Grandchild MD on 04/16/2010   Method used:   Samples Given   RxID:   [id number]    Orders Added: 1)  Est. Patient Level IV [95638]

## 2010-04-30 ENCOUNTER — Ambulatory Visit (INDEPENDENT_AMBULATORY_CARE_PROVIDER_SITE_OTHER): Payer: 59 | Admitting: Internal Medicine

## 2010-04-30 ENCOUNTER — Encounter: Payer: Self-pay | Admitting: Internal Medicine

## 2010-04-30 DIAGNOSIS — J019 Acute sinusitis, unspecified: Secondary | ICD-10-CM | POA: Insufficient documentation

## 2010-04-30 DIAGNOSIS — I10 Essential (primary) hypertension: Secondary | ICD-10-CM

## 2010-04-30 DIAGNOSIS — J309 Allergic rhinitis, unspecified: Secondary | ICD-10-CM

## 2010-04-30 HISTORY — DX: Acute sinusitis, unspecified: J01.90
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
## 2010-08-16 ENCOUNTER — Telehealth: Payer: Self-pay

## 2010-08-16 MED ORDER — CETIRIZINE HCL 10 MG PO CAPS: 1.0000 | ORAL_CAPSULE | Freq: Every day | ORAL | Status: DC

## 2010-08-16 NOTE — Telephone Encounter (Signed)
Ok for cetirizine 10 mg qd prn - robin to handle

## 2010-08-16 NOTE — Telephone Encounter (Signed)
Patient insurance will not pay for Clarinex, only Loratadine or cetirizine. Pharmacy requesting new #90 prescription for this patient.

## 2010-12-13 ENCOUNTER — Ambulatory Visit: Payer: 59

## 2010-12-13 DIAGNOSIS — Z0000 Encounter for general adult medical examination without abnormal findings: Secondary | ICD-10-CM

## 2010-12-13 DIAGNOSIS — Z1289 Encounter for screening for malignant neoplasm of other sites: Secondary | ICD-10-CM

## 2010-12-13 LAB — CBC WITH DIFFERENTIAL/PLATELET
Basophils Relative: 0.3 % (ref 0.0–3.0)
Eosinophils Relative: 2 % (ref 0.0–5.0)
HCT: 45.8 % (ref 39.0–52.0)
Hemoglobin: 15 g/dL (ref 13.0–17.0)
Lymphs Abs: 1.2 10*3/uL (ref 0.7–4.0)
MCV: 93 fl (ref 78.0–100.0)
Monocytes Absolute: 0.5 10*3/uL (ref 0.1–1.0)
Monocytes Relative: 8.3 % (ref 3.0–12.0)
Neutro Abs: 4 10*3/uL (ref 1.4–7.7)
Platelets: 192 10*3/uL (ref 150.0–400.0)
WBC: 5.8 10*3/uL (ref 4.5–10.5)

## 2010-12-13 LAB — URINALYSIS
Bilirubin Urine: NEGATIVE
Hgb urine dipstick: NEGATIVE
Ketones, ur: NEGATIVE
Nitrite: NEGATIVE
Total Protein, Urine: NEGATIVE
Urine Glucose: NEGATIVE
pH: 5.5 (ref 5.0–8.0)

## 2010-12-13 LAB — HEPATIC FUNCTION PANEL
Albumin: 3.8 g/dL (ref 3.5–5.2)
Alkaline Phosphatase: 59 U/L (ref 39–117)
Total Bilirubin: 1 mg/dL (ref 0.3–1.2)

## 2010-12-13 LAB — LIPID PANEL
LDL Cholesterol: 115 mg/dL — ABNORMAL HIGH (ref 0–99)
Total CHOL/HDL Ratio: 3
Triglycerides: 49 mg/dL (ref 0.0–149.0)

## 2010-12-13 LAB — BASIC METABOLIC PANEL
CO2: 30 mEq/L (ref 19–32)
Calcium: 8.8 mg/dL (ref 8.4–10.5)
Chloride: 106 mEq/L (ref 96–112)
Creatinine, Ser: 0.9 mg/dL (ref 0.4–1.5)
Glucose, Bld: 80 mg/dL (ref 70–99)

## 2010-12-13 LAB — TSH: TSH: 0.56 u[IU]/mL (ref 0.35–5.50)

## 2010-12-16 ENCOUNTER — Encounter: Payer: Self-pay | Admitting: Internal Medicine

## 2010-12-19 ENCOUNTER — Encounter: Payer: Self-pay | Admitting: Internal Medicine

## 2010-12-19 DIAGNOSIS — Z0001 Encounter for general adult medical examination with abnormal findings: Secondary | ICD-10-CM | POA: Insufficient documentation

## 2010-12-19 DIAGNOSIS — Z0000 Encounter for general adult medical examination without abnormal findings: Secondary | ICD-10-CM | POA: Insufficient documentation

## 2010-12-21 ENCOUNTER — Ambulatory Visit (INDEPENDENT_AMBULATORY_CARE_PROVIDER_SITE_OTHER): Payer: 59 | Admitting: Internal Medicine

## 2010-12-21 ENCOUNTER — Encounter: Payer: Self-pay | Admitting: Internal Medicine

## 2010-12-21 VITALS — BP 130/82 | HR 67 | Temp 97.3°F | Ht 66.0 in | Wt 237.1 lb

## 2010-12-21 DIAGNOSIS — Z23 Encounter for immunization: Secondary | ICD-10-CM

## 2010-12-21 DIAGNOSIS — M79609 Pain in unspecified limb: Secondary | ICD-10-CM

## 2010-12-21 DIAGNOSIS — Z0000 Encounter for general adult medical examination without abnormal findings: Secondary | ICD-10-CM

## 2010-12-21 DIAGNOSIS — M79671 Pain in right foot: Secondary | ICD-10-CM | POA: Insufficient documentation

## 2010-12-21 MED ORDER — SIMVASTATIN 40 MG PO TABS: 40.0000 mg | ORAL_TABLET | Freq: Every evening | ORAL | Status: DC

## 2010-12-21 MED ORDER — TADALAFIL 20 MG PO TABS: 20.0000 mg | ORAL_TABLET | Freq: Every day | ORAL | Status: DC | PRN

## 2010-12-21 MED ORDER — CETIRIZINE HCL 10 MG PO CAPS: 1.0000 | ORAL_CAPSULE | Freq: Every day | ORAL | Status: DC

## 2010-12-21 MED ORDER — ZOLPIDEM TARTRATE ER 12.5 MG PO TBCR: 12.5000 mg | EXTENDED_RELEASE_TABLET | Freq: Every evening | ORAL | Status: DC | PRN

## 2010-12-21 NOTE — Assessment & Plan Note (Signed)
Ok for referral to Dr hewitt/[place] [facility]

## 2010-12-21 NOTE — Patient Instructions (Addendum)
You had the flu shot and the tetanus shot today Your EKG was normal You will be contacted regarding the referral for: orthopedic - Dr Victorino Dike at [facility] Your refills are done today to [facility] Please return in 1 year for your yearly visit, or sooner if needed, with Lab testing done 3-5 days before

## 2010-12-21 NOTE — Assessment & Plan Note (Signed)

## 2010-12-21 NOTE — Progress Notes (Signed)
Subjective:    Patient ID: Joshua Ludwig Sr., male    DOB: 03/02/1951, 60 y.o.   MRN: [id number]  HPI  Here for wellness and f/u;  Overall doing ok;  Pt denies CP, worsening SOB, DOE, wheezing, orthopnea, PND, worsening LE edema, palpitations, dizziness or syncope.  Pt denies neurological change such as new Headache, facial or extremity weakness.  Pt denies polydipsia, polyuria, or low sugar symptoms. Pt states overall good compliance with treatment and medications, good tolerability, and trying to follow lower cholesterol diet.  Pt denies worsening depressive symptoms, suicidal ideation or panic. No fever, wt loss, night sweats, loss of appetite, or other constitutional symptoms.  Pt states good ability with ADL's, low fall risk, home safety reviewed and adequate, no significant changes in hearing or vision, and occasionally active with exercise.  Concerned about heart disease as father with FH and brother with recent sudden death but survived/resuscitated.  Pt mentions possible hx of rheumatic fever on his part, and brother with hx of arrythmia prior.  Pt denies fever, wt loss, night sweats, loss of appetite, or other constitutional symptoms.  Does have some nasal congestion and ST in the psat 2 days incidentally that seems different from his usual allergies controlled with the zyrtec, but no fever, other pain.  Also with right foot pain - ? Neuritic worse with ambulation similar to left foot problem in the past for which he had surgury; has tentative date for retirement in June 2013, so is trying to plan ahead; may want to have surgury say next march. [place] foot surg[place] move here.  Also with left knee pain similar to pain as before the right was scoped in the past, but wants to wait for any eval at this time. Past Medical History  Diagnosis Date  . Acute pharyngitis 01/26/2009  . ALLERGIC RHINITIS 08/07/2007  . ARTHRITIS 08/07/2007  . BACK PAIN 09/15/2009  . BENIGN PROSTATIC HYPERTROPHY  05/20/2008  . EATING DISORDER, HX OF 08/07/2007  . ERECTILE DYSFUNCTION 02/18/2008  . HYPERLIPIDEMIA 08/07/2007  . HYPERTENSION 08/07/2007  . NECK PAIN, RIGHT 09/15/2009  . 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 SINUSITIS- ACUTE-NOS 04/30/2010   Past Surgical History  Procedure Date  . Gastric bypass 2006  . Lower back 2004  . Left foot surgery 2008  . Right knee arthroscopy 1994    reports that he has never smoked. He does not have any smokeless tobacco history on file. He reports that he does not drink alcohol or use illicit drugs. family history includes Heart disease in his father; Hyperlipidemia in his father; and Hypertension in his father. Allergies  Allergen Reactions  . Ace Inhibitors     REACTION: tongue swelling   Current Outpatient Prescriptions on File Prior to Visit  Medication Sig Dispense Refill  . acetaminophen (TYLENOL ARTHRITIS PAIN) 650 MG CR tablet Take 1,300 mg by mouth every 8 (eight) hours as needed.        . Cetirizine HCl 10 MG CAPS Take 1 capsule (10 mg total) by mouth daily.  90 capsule  3  . cyclobenzaprine (FLEXERIL) 5 MG tablet Take 5 mg by mouth 3 (three) times daily as needed.        . flintstones complete (FLINTSTONES) 60 MG chewable tablet Chew 1 tablet by mouth daily.        . tadalafil (CIALIS) 20 MG tablet Take 20 mg by mouth daily as needed.        . traMADol Janean Sark)  50 MG tablet Take 50 mg by mouth every 6 (six) hours as needed.        . zolpidem (AMBIEN CR) 12.5 MG CR tablet Take 12.5 mg by mouth at bedtime as needed.         Review of Systems Review of Systems  Constitutional: Negative for diaphoresis, activity change, appetite change and unexpected weight change.  HENT: Negative for hearing loss, ear pain, facial swelling, mouth sores and neck stiffness.   Eyes: Negative for pain, redness and visual disturbance.  Respiratory: Negative for shortness of breath and wheezing.   Cardiovascular: Negative for chest pain and palpitations.    Gastrointestinal: Negative for diarrhea, blood in stool, abdominal distention and rectal pain.  Genitourinary: Negative for hematuria, flank pain and decreased urine volume.  Musculoskeletal: Negative for myalgias and joint swelling.  Skin: Negative for color change and wound.  Neurological: Negative for syncope and numbness.  Hematological: Negative for adenopathy.  Psychiatric/Behavioral: Negative for hallucinations, self-injury, decreased concentration and agitation.     Objective:   Physical Exam BP 130/82  Pulse 67  Temp(Src) 97.3 F (36.3 C) (Oral)  Ht 5\' 6"  (1.676 m)  Wt 237 lb 1.9 oz (107.557 kg)  BMI 38.27 kg/m2  SpO2 97% Physical Exam  VS noted Constitutional: Pt is oriented to person, place, and time. Appears well-developed and well-nourished.  HENT:  Head: Normocephalic and atraumatic.  Right Ear: External ear normal.  Left Ear: External ear normal.  Nose: Nose normal.  Mouth/Throat: Oropharynx is clear and moist.  Eyes: Conjunctivae and EOM are normal. Pupils are equal, round, and reactive to light.  Neck: Normal range of motion. Neck supple. No JVD present. No tracheal deviation present.  Cardiovascular: Normal rate, regular rhythm, normal heart sounds and intact distal pulses.   Pulmonary/Chest: Effort normal and breath sounds normal.  Abdominal: Soft. Bowel sounds are normal. There is no tenderness.  Musculoskeletal: Normal range of motion. Exhibits no edema.  Lymphadenopathy:  Has no cervical adenopathy.  Neurological: Pt is alert and oriented to person, place, and time. Pt has normal reflexes. No cranial nerve deficit.  Skin: Skin is warm and dry. No rash noted.  Psychiatric:  Has  normal mood and affect. Behavior is normal.     Assessment & Plan:

## 2010-12-27 ENCOUNTER — Telehealth: Payer: Self-pay

## 2010-12-27 NOTE — Telephone Encounter (Signed)
Yes, ok to change

## 2010-12-27 NOTE — Telephone Encounter (Signed)
Pt called stating that Cetirizine is not covered by [employer]. Pt says that generic Clarinex is the covered alternative, okay to change and send to [facility]?

## 2010-12-28 ENCOUNTER — Other Ambulatory Visit: Payer: Self-pay

## 2010-12-28 MED ORDER — DESLORATADINE 5 MG PO TABS: 5.0000 mg | ORAL_TABLET | Freq: Every day | ORAL | Status: DC

## 2010-12-28 NOTE — Telephone Encounter (Signed)
Called patient left message that alternative change was made for prescription and sent to [facility].

## 2011-02-18 ENCOUNTER — Other Ambulatory Visit: Payer: Self-pay

## 2011-02-18 MED ORDER — ZOLPIDEM TARTRATE ER 12.5 MG PO TBCR: 12.5000 mg | EXTENDED_RELEASE_TABLET | Freq: Every evening | ORAL | Status: DC | PRN

## 2011-02-18 NOTE — Telephone Encounter (Signed)
Pt brought back original Rx, substitute Rx faxed to [facility] as requested.

## 2011-02-18 NOTE — Telephone Encounter (Signed)
Pt called requesting Rx for Zolpidem to his mail order pharmacy per insurance company. Pt will bring back Rx written 10/16 as it was not filled.

## 2011-02-18 NOTE — Telephone Encounter (Signed)
Called informed prescription requested will be sent once received 10/16 prescription. Patient informed will be by the office today 02/18/2011 to turn in and once we receive we will fax new prescription to [facility].

## 2011-02-18 NOTE — Telephone Encounter (Signed)
Done hardcopy to robin  

## 2011-06-17 ENCOUNTER — Encounter: Payer: Self-pay | Admitting: Endocrinology

## 2011-06-17 ENCOUNTER — Ambulatory Visit (INDEPENDENT_AMBULATORY_CARE_PROVIDER_SITE_OTHER): Payer: 59 | Admitting: Endocrinology

## 2011-06-17 VITALS — BP 138/82 | HR 75 | Temp 98.7°F

## 2011-06-17 DIAGNOSIS — J069 Acute upper respiratory infection, unspecified: Secondary | ICD-10-CM

## 2011-06-17 MED ORDER — PROMETHAZINE-CODEINE 6.25-10 MG/5ML PO SYRP: 5.0000 mL | ORAL_SOLUTION | ORAL | Status: AC | PRN

## 2011-06-17 MED ORDER — CEFUROXIME AXETIL 250 MG PO TABS: 250.0000 mg | ORAL_TABLET | Freq: Two times a day (BID) | ORAL | Status: AC

## 2011-06-17 NOTE — Progress Notes (Signed)
  Subjective:    Patient ID: Joshua Ludwig Sr., male    DOB: [birth year], 61 y.o.   MRN: [id number]  HPI Pt states 4 days of prod-quality cough in the chest, and assoc nasal congestion.  No help with clarinex.   Past Medical History  Diagnosis Date  . Acute pharyngitis 01/26/2009  . ALLERGIC RHINITIS 08/07/2007  . ARTHRITIS 08/07/2007  . BACK PAIN 09/15/2009  . BENIGN PROSTATIC HYPERTROPHY 05/20/2008  . EATING DISORDER, HX OF 08/07/2007  . ERECTILE DYSFUNCTION 02/18/2008  . HYPERLIPIDEMIA 08/07/2007  . HYPERTENSION 08/07/2007  . NECK PAIN, RIGHT 09/15/2009  . 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 SINUSITIS- ACUTE-NOS 04/30/2010    Past Surgical History  Procedure Date  . Gastric bypass 2006  . Lower back 2004  . Left foot surgery 2008  . Right knee arthroscopy 1994    History   Social History  . Marital Status: Married    Spouse Name: N/A    Number of Children: N/A  . Years of Education: N/A   Occupational History  . Not on file.   Social History Main Topics  . Smoking status: Never Smoker   . Smokeless tobacco: Not on file  . Alcohol Use: No  . Drug Use: No  . Sexually Active:    Other Topics Concern  . Not on file   Social History Narrative  . No narrative on file    Current Outpatient Prescriptions on File Prior to Visit  Medication Sig Dispense Refill  . acetaminophen (TYLENOL ARTHRITIS PAIN) 650 MG CR tablet Take 1,300 mg by mouth every 8 (eight) hours as needed.        Marland Kitchen aspirin 81 MG tablet Take 81 mg by mouth daily.        Marland Kitchen desloratadine (CLARINEX) 5 MG tablet Take 1 tablet (5 mg total) by mouth daily.  90 tablet  3  . flintstones complete (FLINTSTONES) 60 MG chewable tablet Chew 1 tablet by mouth daily.        . simvastatin (ZOCOR) 40 MG tablet Take 1 tablet (40 mg total) by mouth every evening.  90 tablet  3  . tadalafil (CIALIS) 20 MG tablet Take 1 tablet (20 mg total) by mouth daily as needed.  10 tablet  5  . zolpidem (AMBIEN CR) 12.5 MG CR tablet  Take 1 tablet (12.5 mg total) by mouth at bedtime as needed.  90 tablet  1    Allergies  Allergen Reactions  . Ace Inhibitors     REACTION: tongue swelling    Family History  Problem Relation Age of Onset  . Hyperlipidemia Father   . Heart disease Father   . Hypertension Father     BP 138/82  Pulse 75  Temp(Src) 98.7 F (37.1 C) (Oral)  SpO2 93%    Review of Systems He also has sore throat and eye tearing.  Denies fever and sob.      Objective:   Physical Exam VITAL SIGNS:  See vs page GENERAL: no distress head: no deformity eyes: no periorbital swelling, no proptosis external nose and ears are normal mouth: no lesion seen, except pharynx is red Both tm's are red NECK: There is no palpable thyroid enlargement.  No thyroid nodule is palpable.  No palpable lymphadenopathy at the anterior neck. LUNGS:  Clear to auscultation       Assessment & Plan:  Joshua Burnett, new

## 2011-06-17 NOTE — Patient Instructions (Signed)
i have sent a prescription to your pharmacy, for an antibiotic. Loratadine-d (non-prescription) will help your congestion. I hope you feel better soon.  If you don't feel better by next week, please call back. Here is a prescription for cough syrup.

## 2011-06-20 ENCOUNTER — Ambulatory Visit: Payer: 59 | Admitting: Internal Medicine

## 2011-07-21 ENCOUNTER — Other Ambulatory Visit: Payer: Self-pay

## 2011-07-21 MED ORDER — ZOLPIDEM TARTRATE ER 12.5 MG PO TBCR: 12.5000 mg | EXTENDED_RELEASE_TABLET | Freq: Every evening | ORAL | Status: DC | PRN

## 2011-07-21 NOTE — Telephone Encounter (Signed)
Done hardcopy to robin  

## 2011-07-21 NOTE — Telephone Encounter (Signed)
Faxed hardcopy to pharmacy. 

## 2011-12-22 ENCOUNTER — Encounter: Payer: Self-pay | Admitting: Internal Medicine

## 2011-12-22 ENCOUNTER — Other Ambulatory Visit (INDEPENDENT_AMBULATORY_CARE_PROVIDER_SITE_OTHER): Payer: BC Managed Care – PPO

## 2011-12-22 ENCOUNTER — Ambulatory Visit (INDEPENDENT_AMBULATORY_CARE_PROVIDER_SITE_OTHER): Payer: BC Managed Care – PPO | Admitting: Internal Medicine

## 2011-12-22 ENCOUNTER — Telehealth: Payer: Self-pay

## 2011-12-22 VITALS — BP 118/78 | HR 73 | Temp 97.7°F | Ht 71.5 in | Wt 242.5 lb

## 2011-12-22 DIAGNOSIS — Z23 Encounter for immunization: Secondary | ICD-10-CM

## 2011-12-22 DIAGNOSIS — Z0000 Encounter for general adult medical examination without abnormal findings: Secondary | ICD-10-CM

## 2011-12-22 DIAGNOSIS — Z136 Encounter for screening for cardiovascular disorders: Secondary | ICD-10-CM

## 2011-12-22 LAB — CBC WITH DIFFERENTIAL/PLATELET
Basophils Relative: 0.4 % (ref 0.0–3.0)
Eosinophils Absolute: 0.1 10*3/uL (ref 0.0–0.7)
MCHC: 33.3 g/dL (ref 30.0–36.0)
MCV: 90.6 fl (ref 78.0–100.0)
Monocytes Absolute: 0.5 10*3/uL (ref 0.1–1.0)
Neutrophils Relative %: 68.3 % (ref 43.0–77.0)
Platelets: 194 10*3/uL (ref 150.0–400.0)
RBC: 4.98 Mil/uL (ref 4.22–5.81)

## 2011-12-22 LAB — URINALYSIS, ROUTINE W REFLEX MICROSCOPIC
Bilirubin Urine: NEGATIVE
Hgb urine dipstick: NEGATIVE
Leukocytes, UA: NEGATIVE
Nitrite: NEGATIVE
Urobilinogen, UA: 0.2 (ref 0.0–1.0)
pH: 6 (ref 5.0–8.0)

## 2011-12-22 LAB — BASIC METABOLIC PANEL
BUN: 15 mg/dL (ref 6–23)
Chloride: 107 mEq/L (ref 96–112)
Potassium: 4 mEq/L (ref 3.5–5.1)
Sodium: 142 mEq/L (ref 135–145)

## 2011-12-22 LAB — HEPATIC FUNCTION PANEL
Bilirubin, Direct: 0.2 mg/dL (ref 0.0–0.3)
Total Protein: 6.7 g/dL (ref 6.0–8.3)

## 2011-12-22 LAB — LIPID PANEL
Cholesterol: 188 mg/dL (ref 0–200)
HDL: 54.2 mg/dL (ref >39.00)
LDL Cholesterol: 119 mg/dL — ABNORMAL HIGH (ref 0–99)
Triglycerides: 76 mg/dL (ref 0.0–149.0)
VLDL: 15.2 mg/dL (ref 0.0–40.0)

## 2011-12-22 LAB — PSA: PSA: 0.64 ng/mL (ref 0.10–4.00)

## 2011-12-22 MED ORDER — SIMVASTATIN 40 MG PO TABS: 40.0000 mg | ORAL_TABLET | Freq: Every evening | ORAL | Status: DC

## 2011-12-22 MED ORDER — TADALAFIL 20 MG PO TABS: 20.0000 mg | ORAL_TABLET | Freq: Every day | ORAL | Status: DC | PRN

## 2011-12-22 MED ORDER — DESLORATADINE 5 MG PO TABS: 5.0000 mg | ORAL_TABLET | Freq: Every day | ORAL | Status: DC

## 2011-12-22 NOTE — Telephone Encounter (Signed)
Received PA for Cialis, proceed or offer alternative

## 2011-12-22 NOTE — Assessment & Plan Note (Signed)

## 2011-12-22 NOTE — Telephone Encounter (Signed)
Ok for PA, since there is no good alternative

## 2011-12-22 NOTE — Progress Notes (Signed)
Subjective:    Patient ID: Joshua Ludwig Sr., male    DOB: 11-30-1950, 61 y.o.   MRN: [id number]  HPI  Here for wellness and f/u;  Overall doing ok;  Pt denies CP, worsening SOB, DOE, wheezing, orthopnea, PND, worsening LE edema, palpitations, dizziness or syncope.  Pt denies neurological change such as new Headache, facial or extremity weakness.  Pt denies polydipsia, polyuria, or low sugar symptoms. Pt states overall good compliance with treatment and medications, good tolerability, and trying to follow lower cholesterol diet.  Pt denies worsening depressive symptoms, suicidal ideation or panic. No fever, wt loss, night sweats, loss of appetite, or other constitutional symptoms.  Pt states good ability with ADL's, low fall risk, home safety reviewed and adequate, no significant changes in hearing or vision, and occasionally active with exercise.  Lost job in [employer].  Looking for other work, increased stress with this since June with about 15 lbs wt increase.  Noted some left knee increased pain, tylenol arthritis not always working as well.  Past Medical History  Diagnosis Date  . Acute pharyngitis 01/26/2009  . ALLERGIC RHINITIS 08/07/2007  . ARTHRITIS 08/07/2007  . BACK PAIN 09/15/2009  . BENIGN PROSTATIC HYPERTROPHY 05/20/2008  . EATING DISORDER, HX OF 08/07/2007  . ERECTILE DYSFUNCTION 02/18/2008  . HYPERLIPIDEMIA 08/07/2007  . HYPERTENSION 08/07/2007  . NECK PAIN, RIGHT 09/15/2009  . 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 SINUSITIS- ACUTE-NOS 04/30/2010   Past Surgical History  Procedure Date  . Gastric bypass 2006  . Lower back 2004  . Left foot surgery 2008  . Right knee arthroscopy 1994    reports that he has never smoked. He does not have any smokeless tobacco history on file. He reports that he does not drink alcohol or use illicit drugs. family history includes Heart disease in his father; Hyperlipidemia in his father; and Hypertension in his father. Allergies    Allergen Reactions  . Ace Inhibitors     REACTION: tongue swelling   Current Outpatient Prescriptions on File Prior to Visit  Medication Sig Dispense Refill  . acetaminophen (TYLENOL ARTHRITIS PAIN) 650 MG CR tablet Take 500 mg by mouth every 8 (eight) hours as needed.       Marland Kitchen aspirin 81 MG tablet Take 81 mg by mouth daily.        Marland Kitchen desloratadine (CLARINEX) 5 MG tablet Take 1 tablet (5 mg total) by mouth daily.  90 tablet  3  . flintstones complete (FLINTSTONES) 60 MG chewable tablet Chew 1 tablet by mouth daily.        . Omega-3 Krill Oil 500 MG CAPS Take 1 capsule by mouth daily.      . tadalafil (CIALIS) 20 MG tablet Take 1 tablet (20 mg total) by mouth daily as needed.  10 tablet  5  . zolpidem (AMBIEN CR) 12.5 MG CR tablet Take 1 tablet (12.5 mg total) by mouth at bedtime as needed.  90 tablet  1  . simvastatin (ZOCOR) 40 MG tablet Take 1 tablet (40 mg total) by mouth every evening.  90 tablet  3   Review of Systems Review of Systems  Constitutional: Negative for diaphoresis, activity change, appetite change and unexpected weight change.  HENT: Negative for hearing loss, ear pain, facial swelling, mouth sores and neck stiffness.   Eyes: Negative for pain, redness and visual disturbance.  Respiratory: Negative for shortness of breath and wheezing.   Cardiovascular: Negative for chest pain and palpitations.  Gastrointestinal: Negative for diarrhea,  blood in stool, abdominal distention and rectal pain.  Genitourinary: Negative for hematuria, flank pain and decreased urine volume.  Musculoskeletal: Negative for myalgias and joint swelling.  Skin: Negative for color change and wound.  Neurological: Negative for syncope and numbness.  Hematological: Negative for adenopathy.  Psychiatric/Behavioral: Negative for hallucinations, self-injury, decreased concentration and agitation.      Objective:   Physical Exam BP 118/78  Pulse 73  Temp 97.7 F (36.5 C) (Oral)  Ht 5' 11.5" (1.816  m)  Wt 242 lb 8 oz (109.997 kg)  BMI 33.35 kg/m2  SpO2 93% Physical Exam  VS noted Constitutional: Pt is oriented to person, place, and time. Appears well-developed and well-nourished.  HENT:  Head: Normocephalic and atraumatic.  Right Ear: External ear normal.  Left Ear: External ear normal.  Nose: Nose normal.  Mouth/Throat: Oropharynx is clear and moist.  Eyes: Conjunctivae and EOM are normal. Pupils are equal, round, and reactive to light.  Neck: Normal range of motion. Neck supple. No JVD present. No tracheal deviation present.  Cardiovascular: Normal rate, regular rhythm, normal heart sounds and intact distal pulses.   Pulmonary/Chest: Effort normal and breath sounds normal.  Abdominal: Soft. Bowel sounds are normal. There is no tenderness.  Musculoskeletal: Normal range of motion. Exhibits no edema.  Lymphadenopathy:  Has no cervical adenopathy.  Neurological: Pt is alert and oriented to person, place, and time. Pt has normal reflexes. No cranial nerve deficit.  Skin: Skin is warm and dry. No rash noted.  Psychiatric:  Has  normal mood and affect. Behavior is normal.     Assessment & Plan:

## 2011-12-22 NOTE — Patient Instructions (Addendum)
Please call your [place] to find out when you are due for follow up colonoscopy, as you may now be due You had the flu shot today Please go to LAB in the Basement for the blood and/or urine tests to be done today You will be contacted by phone if any changes need to be made immediately.  Otherwise, you will receive a letter about your results with an explanation. Thank you for enrolling in MyChart. Please follow the instructions below to securely access your online medical record. MyChart allows you to send messages to your doctor, view your test results, renew your prescriptions, schedule appointments, and more. You had the flu shot today You are otherwise up to date with prevention measures Your refills were done today as requested Please return in 1 year for your yearly visit, or sooner if needed, with Lab testing done 3-5 days before  OK to have your Wife make Appointment for next wk

## 2011-12-24 ENCOUNTER — Encounter: Payer: Self-pay | Admitting: Internal Medicine

## 2011-12-27 ENCOUNTER — Encounter: Payer: Self-pay | Admitting: Internal Medicine

## 2011-12-27 ENCOUNTER — Other Ambulatory Visit: Payer: Self-pay | Admitting: Internal Medicine

## 2011-12-27 DIAGNOSIS — Z0000 Encounter for general adult medical examination without abnormal findings: Secondary | ICD-10-CM

## 2011-12-27 MED ORDER — TRAMADOL HCL 50 MG PO TABS: 50.0000 mg | ORAL_TABLET | Freq: Four times a day (QID) | ORAL | Status: DC | PRN

## 2011-12-27 MED ORDER — TADALAFIL 20 MG PO TABS: 20.0000 mg | ORAL_TABLET | Freq: Every day | ORAL | Status: DC | PRN

## 2011-12-28 NOTE — Telephone Encounter (Signed)
Called to initiate PA awaiting response.

## 2011-12-28 NOTE — Telephone Encounter (Signed)
c 

## 2012-05-14 ENCOUNTER — Other Ambulatory Visit: Payer: Self-pay

## 2012-05-14 MED ORDER — SIMVASTATIN 40 MG PO TABS: 40.0000 mg | ORAL_TABLET | Freq: Every evening | ORAL | Status: DC

## 2012-05-14 MED ORDER — DESLORATADINE 5 MG PO TABS: 5.0000 mg | ORAL_TABLET | Freq: Every day | ORAL | Status: DC

## 2012-06-05 ENCOUNTER — Telehealth: Payer: Self-pay | Admitting: Endocrinology

## 2012-06-05 NOTE — Telephone Encounter (Signed)
Tried calling patient to schedule follow up, no answer, left voice message on machine   

## 2012-06-08 ENCOUNTER — Encounter: Payer: Self-pay | Admitting: Internal Medicine

## 2012-06-11 ENCOUNTER — Ambulatory Visit (INDEPENDENT_AMBULATORY_CARE_PROVIDER_SITE_OTHER): Payer: BC Managed Care – PPO | Admitting: Internal Medicine

## 2012-06-11 ENCOUNTER — Encounter: Payer: Self-pay | Admitting: Internal Medicine

## 2012-06-11 VITALS — BP 138/78 | HR 78 | Temp 97.3°F

## 2012-06-11 DIAGNOSIS — B029 Zoster without complications: Secondary | ICD-10-CM

## 2012-06-11 MED ORDER — GABAPENTIN 100 MG PO CAPS: 100.0000 mg | ORAL_CAPSULE | Freq: Three times a day (TID) | ORAL | Status: DC

## 2012-06-11 NOTE — Patient Instructions (Signed)
Postherpetic Neuralgia Shingles is a painful disease. It is caused by the herpes zoster virus. This is the same virus which also causes chickenpox. It can affect the torso, limbs, or the face. For most people, shingles is a condition of rather sudden onset. Pain usually lasts about 1 month. In older patients, or patients with poor immune systems, a painful, long-standing (chronic) condition called postherpetic neuralgia can develop. This condition rarely happens before age 14. But at least 50% of people over 50 become affected following an attack of shingles. There is a natural tendency for this condition to improve over time with no treatment. Less than 5% of patients have pain that lasts for more than 1 year. DIAGNOSIS  Herpes is usually easily diagnosed on physical exam. Pain sometimes follows when the skin sores (lesions) have disappeared. It is called postherpetic neuralgia. That name simply means the pain that follows herpes. TREATMENT   Treating this condition may be difficult. Usually one of the tricyclic antidepressants, often amitriptyline, is the first line of treatment. There is evidence that the sooner these medications are given, the more likely they are to reduce pain.  Conventional analgesics, regional nerve blocks, and anticonvulsants have little benefit in most cases when used alone. Other tricyclic anti-depressants are used as a second option if the first antidepressant is unsuccessful.  Anticonvulsants, including carbamazepine, have been found to provide some added benefit when used with a tricyclic anti-depressant. This is especially for the stabbing type of pain similar to that of trigeminal neuralgia.  Chronic opioid therapy. This is a strong narcotic pain medication. It is used to treat pain that is resistant to other measures. The issues of dependency and tolerance can be reduced with closely managed care.  Some cream treatments are applied locally to the affected area. They  can help when used with other treatments. Their use may be difficult in the case of postherpetic trigeminal neuralgia. This is involved with the face. So the substances can irritate the eye and the skin around the eye. Examples of creams used include Capsaicin and lidocaine creams.  For shingles, antiviral therapies along with analgesics are recommended. Studies of the effect of anti-viral agents such as acyclovir on shingles have been done. They show improved rates of healing and decreased severity of sudden (acute) pain. Some observations suggest that nerve blocks during shingles infection will:  Reduce pain.  Shorten the acute episode.  Prevent the emergence of postherpetic neuralgia. Viral medications used include Acyclovir (Zovirax), Valacyclovir, Famciclovir and a lysine diet. Document Released: 05/14/2002 Document Revised: 05/16/2011 Document Reviewed: 02/21/2005 [facility] Patient Information 2013 [place], [place]. Shingles Shingles is caused by the same virus that causes chickenpox (varicella zoster virus or VZV). Shingles often occurs many years or decades after having chickenpox. That is why it is more common in adults older than 50 years. The virus reactivates and breaks out as an infection in a nerve root. SYMPTOMS   The initial feeling (sensations) may be pain. This pain is usually described as:  Burning.  Stabbing.  Throbbing.  Tingling in the nerve root.  A red rash will follow in a couple days. The rash may occur in any area of the body and is usually on one side (unilateral) of the body in a band or belt-like pattern. The rash usually starts out as very small blisters (vesicles). They will dry up after 7 to 10 days. This is not usually a significant problem except for the pain it causes.  Long-lasting (chronic) pain is more  likely in an elderly person. It can last months to years. This condition is called postherpetic neuralgia. Shingles can be an extremely severe  infection in someone with AIDS, a weakened immune system, or with forms of leukemia. It can also be severe if you are taking transplant medicines or other medicines that weaken the immune system. TREATMENT  Your caregiver will often treat you with:  Antiviral drugs.  Anti-inflammatory drugs.  Pain medicines. Bed rest is very important in preventing the pain associated with herpes zoster (postherpetic neuralgia). Application of heat in the form of a hot water bottle or electric heating pad or gentle pressure with the hand is recommended to help with the pain or discomfort. PREVENTION  A varicella zoster vaccine is available to help protect against the virus. The Food and Drug Administration approved the varicella zoster vaccine for individuals 77 years of age and older. HOME CARE INSTRUCTIONS   Cool compresses to the area of rash may be helpful.  Only take over-the-counter or prescription medicines for pain, discomfort, or fever as directed by your caregiver.  Avoid contact with:  Babies.  Pregnant women.  Children with eczema.  Elderly people with transplants.  People with chronic illnesses, such as leukemia and AIDS.  If the area involved is on your face, you may receive a referral for follow-up to a specialist. It is very important to keep all follow-up appointments. This will help avoid eye complications, chronic pain, or disability. SEEK IMMEDIATE MEDICAL CARE IF:   You develop any pain (headache) in the area of the face or eye. This must be followed carefully by your caregiver or ophthalmologist. An infection in part of your eye (cornea) can be very serious. It could lead to blindness.  You do not have pain relief from prescribed medicines.  Your redness or swelling spreads.  The area involved becomes very swollen and painful.  You have a fever.  You notice any red or painful lines extending away from the affected area toward your heart (lymphangitis).  Your  condition is worsening or has changed. Document Released: 02/21/2005 Document Revised: 05/16/2011 Document Reviewed: 01/26/2009 [facility] Patient Information 2013 [place], [place].

## 2012-06-11 NOTE — Progress Notes (Signed)
Subjective:    Patient ID: Joshua Ludwig Sr., male    DOB: 1950/06/18, 62 y.o.   MRN: [id number]  HPI  Pt presents to the clinic today with c/o rash that he thinks may be shingles. He started having pain on the right side of his back 2.5 weeks ago.He thought maybe he had burned an area of skin on his back with an ice pack. Then 1 week later, all the bumps started appearing on the right side of his back and abdomen. They are very painful to touch. He has taken ibuprofen without much relief. He has had chicken pox as a child. He has never had the shingles vaccine.  Review of Systems      Past Medical History  Diagnosis Date  . Acute pharyngitis 01/26/2009  . ALLERGIC RHINITIS 08/07/2007  . ARTHRITIS 08/07/2007  . BACK PAIN 09/15/2009  . BENIGN PROSTATIC HYPERTROPHY 05/20/2008  . EATING DISORDER, HX OF 08/07/2007  . ERECTILE DYSFUNCTION 02/18/2008  . HYPERLIPIDEMIA 08/07/2007  . HYPERTENSION 08/07/2007  . NECK PAIN, RIGHT 09/15/2009  . 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 SINUSITIS- ACUTE-NOS 04/30/2010    Current Outpatient Prescriptions  Medication Sig Dispense Refill  . acetaminophen (TYLENOL ARTHRITIS PAIN) 650 MG CR tablet Take 500 mg by mouth every 8 (eight) hours as needed.       Marland Kitchen aspirin 81 MG tablet Take 81 mg by mouth daily.        Marland Kitchen desloratadine (CLARINEX) 5 MG tablet Take 1 tablet (5 mg total) by mouth daily.  90 tablet  2  . flintstones complete (FLINTSTONES) 60 MG chewable tablet Chew 1 tablet by mouth daily.        . Omega-3 Krill Oil 500 MG CAPS Take 1 capsule by mouth daily.      . simvastatin (ZOCOR) 40 MG tablet Take 1 tablet (40 mg total) by mouth every evening.  90 tablet  2  . tadalafil (CIALIS) 20 MG tablet Take 1 tablet (20 mg total) by mouth daily as needed. For the medical condition:  Erectile dysfunction  10 tablet  5  . traMADol (ULTRAM) 50 MG tablet Take 1 tablet (50 mg total) by mouth every 6 (six) hours as needed for pain.  120 tablet  2  . zolpidem  (AMBIEN CR) 12.5 MG CR tablet Take 1 tablet (12.5 mg total) by mouth at bedtime as needed.  90 tablet  1   No current facility-administered medications for this visit.    Allergies  Allergen Reactions  . Ace Inhibitors     REACTION: tongue swelling    Family History  Problem Relation Age of Onset  . Hyperlipidemia Father   . Heart disease Father   . Hypertension Father     History   Social History  . Marital Status: Married    Spouse Name: N/A    Number of Children: N/A  . Years of Education: N/A   Occupational History  . Not on file.   Social History Main Topics  . Smoking status: Never Smoker   . Smokeless tobacco: Not on file  . Alcohol Use: No  . Drug Use: No  . Sexually Active: Not on file   Other Topics Concern  . Not on file   Social History Narrative  . No narrative on file     Constitutional: Denies fever, malaise, fatigue, headache or abrupt weight changes.  Skin: Pt reports painful rash on right side of back and abdomen. Denies redness, rashes, lesions or ulcercations.  Neurological: Denies dizziness, difficulty with memory, difficulty with speech or problems with balance and coordination.   No other specific complaints in a complete review of systems (except as listed in HPI above).  Objective:   Physical Exam   BP 138/78  Pulse 78  Temp(Src) 97.3 F (36.3 C) (Oral)  SpO2 97% Wt Readings from Last 3 Encounters:  12/22/11 242 lb 8 oz (109.997 kg)  12/21/10 237 lb 1.9 oz (107.557 kg)  04/30/10 235 lb 8 oz (106.822 kg)    General: Appears his stated age, well developed, well nourished in NAD. Skin: Scabbed over vesicular lesions in dermatome pattern, resembling shingles. Cardiovascular: Normal rate and rhythm. S1,S2 noted.  No murmur, rubs or gallops noted. No JVD or BLE edema. No carotid bruits noted. Pulmonary/Chest: Normal effort and positive vesicular breath sounds. No respiratory distress. No wheezes, rales or ronchi noted.   Neurological: Alert and oriented. Cranial nerves II-XII intact. Coordination normal. +DTRs bilaterally.       Assessment & Plan:  Shingles, new onset:  Unable to treat with antiviral therapy due to length of disease process Will give Neurontin for neuropathic pain TID When lesions are gone, come back for your shingles vaccine.  RTC as needed or if symptoms persist or get worse.

## 2012-06-18 ENCOUNTER — Telehealth: Payer: Self-pay | Admitting: Endocrinology

## 2012-06-18 NOTE — Telephone Encounter (Signed)
recv'd notice from [facility] [facility] advising pt may not be compliant w/ his prescribed statin med, based on refill rate. LM w/ pt's wife asking pt to call for an appt. Currently awaiting return call from the pt / Sherri S.

## 2012-07-04 ENCOUNTER — Encounter: Payer: Self-pay | Admitting: Internal Medicine

## 2012-07-04 ENCOUNTER — Telehealth: Payer: Self-pay

## 2012-07-04 ENCOUNTER — Ambulatory Visit (INDEPENDENT_AMBULATORY_CARE_PROVIDER_SITE_OTHER): Payer: BC Managed Care – PPO | Admitting: Internal Medicine

## 2012-07-04 ENCOUNTER — Telehealth: Payer: Self-pay | Admitting: Internal Medicine

## 2012-07-04 VITALS — BP 132/80 | HR 83 | Temp 98.7°F | Ht 71.0 in | Wt 235.1 lb

## 2012-07-04 DIAGNOSIS — J309 Allergic rhinitis, unspecified: Secondary | ICD-10-CM

## 2012-07-04 DIAGNOSIS — M19049 Primary osteoarthritis, unspecified hand: Secondary | ICD-10-CM | POA: Insufficient documentation

## 2012-07-04 DIAGNOSIS — J019 Acute sinusitis, unspecified: Secondary | ICD-10-CM

## 2012-07-04 DIAGNOSIS — I10 Essential (primary) hypertension: Secondary | ICD-10-CM

## 2012-07-04 MED ORDER — HYDROCODONE-HOMATROPINE 5-1.5 MG/5ML PO SYRP: 5.0000 mL | ORAL_SOLUTION | Freq: Four times a day (QID) | ORAL | Status: DC | PRN

## 2012-07-04 MED ORDER — LEVOFLOXACIN 250 MG PO TABS: 250.0000 mg | ORAL_TABLET | Freq: Every day | ORAL | Status: DC

## 2012-07-04 MED ORDER — FLUTICASONE PROPIONATE 50 MCG/ACT NA SUSP: 2.0000 | Freq: Every day | NASAL | Status: DC

## 2012-07-04 NOTE — Assessment & Plan Note (Signed)
stable overall by history and exam, recent data reviewed with pt, and pt to continue medical treatment as before,  to f/u any worsening symptoms or concerns BP Readings from Last 3 Encounters:  07/04/12 132/80  06/11/12 138/78  12/22/11 118/78

## 2012-07-04 NOTE — Patient Instructions (Signed)
Please take all new medication as prescribed - the antibiotic, cough medicine, and flonase for the allergies Please continue all other medications as before, and refills have been done if requested. Please have the pharmacy call with any other refills you may need.

## 2012-07-04 NOTE — Progress Notes (Signed)
Subjective:    Patient ID: Joshua Ludwig Sr., male    DOB: 1950-05-27, 62 y.o.   MRN: [id number]  HPI   Here with 2-3 days acute onset fever, facial pain, pressure, headache, general weakness and malaise, and greenish d/c, with mild ST and cough, but pt denies chest pain, wheezing, increased sob or doe, orthopnea, PND, increased LE swelling, palpitations, dizziness or syncope.  Does have several wks ongoing nasal allergy symptoms with clearish congestion, itch and sneezing, without fever, pain, ST, cough, swelling or wheezing.    Past Medical History  Diagnosis Date  . Acute pharyngitis 01/26/2009  . ALLERGIC RHINITIS 08/07/2007  . ARTHRITIS 08/07/2007  . BACK PAIN 09/15/2009  . BENIGN PROSTATIC HYPERTROPHY 05/20/2008  . EATING DISORDER, HX OF 08/07/2007  . ERECTILE DYSFUNCTION 02/18/2008  . HYPERLIPIDEMIA 08/07/2007  . HYPERTENSION 08/07/2007  . NECK PAIN, RIGHT 09/15/2009  . 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 SINUSITIS- ACUTE-NOS 04/30/2010   Past Surgical History  Procedure Laterality Date  . Gastric bypass  2006  . Lower back  2004  . Left foot surgery  2008  . Right knee arthroscopy  1994    reports that he has never smoked. He does not have any smokeless tobacco history on file. He reports that he does not drink alcohol or use illicit drugs. family history includes Heart disease in his father; Hyperlipidemia in his father; and Hypertension in his father. Allergies  Allergen Reactions  . Ace Inhibitors     REACTION: tongue swelling   Current Outpatient Prescriptions on File Prior to Visit  Medication Sig Dispense Refill  . acetaminophen (TYLENOL ARTHRITIS PAIN) 650 MG CR tablet Take 500 mg by mouth every 8 (eight) hours as needed.       Marland Kitchen aspirin 81 MG tablet Take 81 mg by mouth daily.        Marland Kitchen desloratadine (CLARINEX) 5 MG tablet Take 1 tablet (5 mg total) by mouth daily.  90 tablet  2  . flintstones complete (FLINTSTONES) 60 MG chewable tablet Chew 1 tablet by mouth  daily.        Marland Kitchen gabapentin (NEURONTIN) 100 MG capsule Take 1 capsule (100 mg total) by mouth 3 (three) times daily.  90 capsule  3  . Omega-3 Krill Oil 500 MG CAPS Take 1 capsule by mouth daily.      . simvastatin (ZOCOR) 40 MG tablet Take 1 tablet (40 mg total) by mouth every evening.  90 tablet  2  . tadalafil (CIALIS) 20 MG tablet Take 1 tablet (20 mg total) by mouth daily as needed. For the medical condition:  Erectile dysfunction  10 tablet  5  . traMADol (ULTRAM) 50 MG tablet Take 1 tablet (50 mg total) by mouth every 6 (six) hours as needed for pain.  120 tablet  2  . zolpidem (AMBIEN CR) 12.5 MG CR tablet Take 1 tablet (12.5 mg total) by mouth at bedtime as needed.  90 tablet  1   No current facility-administered medications on file prior to visit.   Review of Systems  Constitutional: Negative for unexpected weight change, or unusual diaphoresis  HENT: Negative for tinnitus.   Eyes: Negative for photophobia and visual disturbance.  Respiratory: Negative for choking and stridor.   Gastrointestinal: Negative for vomiting and blood in stool.  Genitourinary: Negative for hematuria and decreased urine volume.  Musculoskeletal: Negative for acute joint swelling Skin: Negative for color change and wound.  Neurological: Negative for tremors and numbness other than noted  Psychiatric/Behavioral: Negative for decreased concentration or  hyperactivity.       Objective:   Physical Exam BP 132/80  Pulse 83  Temp(Src) 98.7 F (37.1 C) (Oral)  Ht 5\' 11"  (1.803 m)  Wt 235 lb 2 oz (106.652 kg)  BMI 32.81 kg/m2  SpO2 93% VS noted, mild ill Constitutional: Pt appears well-developed and well-nourished.  HENT: Head: NCAT.  Right Ear: External ear normal.  Left Ear: External ear normal.  Bilat tm's with mild erythema.  Max sinus areas mild tender.  Pharynx with mild erythema, no exudate Eyes: Conjunctivae and EOM are normal. Pupils are equal, round, and reactive to light.  Neck: Normal  range of motion. Neck supple.  Cardiovascular: Normal rate and regular rhythm.   Pulmonary/Chest: Effort normal and breath sounds normal.  Neurological: Pt is alert. Not confused  Skin: Skin is warm. No erythema.  Psychiatric: Pt behavior is normal. Thought content normal.     Assessment & Plan:

## 2012-07-04 NOTE — Telephone Encounter (Signed)
Ok to make Nurse Visit for shingles shot, no need to wait until this rash clears, since it is not related

## 2012-07-04 NOTE — Assessment & Plan Note (Signed)
Mild to mod, for flonase asd,  to f/u any worsening symptoms or concerns  

## 2012-07-04 NOTE — Telephone Encounter (Signed)
Pt saw you earlier this morning and forgot to ask you when will he be able to have the singles vaccine after the lesions/rash clears up? Please advise.

## 2012-07-04 NOTE — Assessment & Plan Note (Signed)
Mild to mod, for antibx course,  to f/u any worsening symptoms or concerns 

## 2012-07-04 NOTE — Telephone Encounter (Signed)
Called left message to call back 

## 2012-07-04 NOTE — Telephone Encounter (Signed)
Caller: Joshua Burnett/Patient; Phone: [phone]; Reason for Call: Returning a phone call from Robin in the office.  Please contact patient [phone]

## 2012-07-05 NOTE — Telephone Encounter (Signed)
Pt informed of MD's advisement-see previous phone note.

## 2012-07-05 NOTE — Telephone Encounter (Signed)
Pt informed of MD's advisement, pt will callback to make Nurse Visit for shingles vaccine.

## 2012-12-14 ENCOUNTER — Other Ambulatory Visit (INDEPENDENT_AMBULATORY_CARE_PROVIDER_SITE_OTHER): Payer: BC Managed Care – PPO

## 2012-12-14 DIAGNOSIS — Z0000 Encounter for general adult medical examination without abnormal findings: Secondary | ICD-10-CM

## 2012-12-14 LAB — CBC WITH DIFFERENTIAL/PLATELET
Basophils Absolute: 0.1 10*3/uL (ref 0.0–0.1)
Basophils Relative: 1.2 % (ref 0.0–3.0)
Eosinophils Absolute: 0.2 10*3/uL (ref 0.0–0.7)
Lymphocytes Relative: 16.1 % (ref 12.0–46.0)
MCHC: 33.7 g/dL (ref 30.0–36.0)
MCV: 88.9 fl (ref 78.0–100.0)
Monocytes Absolute: 0.5 10*3/uL (ref 0.1–1.0)
Neutrophils Relative %: 70.5 % (ref 43.0–77.0)
Platelets: 191 10*3/uL (ref 150.0–400.0)
RBC: 4.72 Mil/uL (ref 4.22–5.81)
RDW: 13.6 % (ref 11.5–14.6)

## 2012-12-14 LAB — URINALYSIS, ROUTINE W REFLEX MICROSCOPIC
Hgb urine dipstick: NEGATIVE
Leukocytes, UA: NEGATIVE
Nitrite: NEGATIVE
Specific Gravity, Urine: 1.02 (ref 1.000–1.030)
Urine Glucose: NEGATIVE
Urobilinogen, UA: 1 (ref 0.0–1.0)

## 2012-12-14 LAB — BASIC METABOLIC PANEL
BUN: 12 mg/dL (ref 6–23)
CO2: 30 mEq/L (ref 19–32)
Chloride: 104 mEq/L (ref 96–112)
Creatinine, Ser: 0.8 mg/dL (ref 0.4–1.5)
Potassium: 4.4 mEq/L (ref 3.5–5.1)

## 2012-12-14 LAB — HEPATIC FUNCTION PANEL
Alkaline Phosphatase: 54 U/L (ref 39–117)
Bilirubin, Direct: 0.2 mg/dL (ref 0.0–0.3)
Total Bilirubin: 1.1 mg/dL (ref 0.3–1.2)
Total Protein: 6.1 g/dL (ref 6.0–8.3)

## 2012-12-14 LAB — LIPID PANEL
Cholesterol: 171 mg/dL (ref 0–200)
LDL Cholesterol: 103 mg/dL — ABNORMAL HIGH (ref 0–99)
Total CHOL/HDL Ratio: 3
VLDL: 11.6 mg/dL (ref 0.0–40.0)

## 2012-12-24 ENCOUNTER — Encounter: Payer: BC Managed Care – PPO | Admitting: Internal Medicine

## 2012-12-28 ENCOUNTER — Ambulatory Visit (INDEPENDENT_AMBULATORY_CARE_PROVIDER_SITE_OTHER): Payer: BC Managed Care – PPO | Admitting: Internal Medicine

## 2012-12-28 ENCOUNTER — Encounter: Payer: Self-pay | Admitting: Internal Medicine

## 2012-12-28 VITALS — BP 122/80 | HR 78 | Temp 97.8°F | Ht 71.5 in | Wt 235.5 lb

## 2012-12-28 DIAGNOSIS — Z0000 Encounter for general adult medical examination without abnormal findings: Secondary | ICD-10-CM

## 2012-12-28 DIAGNOSIS — Z2911 Encounter for prophylactic immunotherapy for respiratory syncytial virus (RSV): Secondary | ICD-10-CM

## 2012-12-28 DIAGNOSIS — Z136 Encounter for screening for cardiovascular disorders: Secondary | ICD-10-CM

## 2012-12-28 DIAGNOSIS — Z23 Encounter for immunization: Secondary | ICD-10-CM

## 2012-12-28 DIAGNOSIS — M1710 Unilateral primary osteoarthritis, unspecified knee: Secondary | ICD-10-CM

## 2012-12-28 MED ORDER — DICLOFENAC SODIUM 1 % TD GEL: 4.0000 g | Freq: Four times a day (QID) | TRANSDERMAL | Status: DC | PRN

## 2012-12-28 MED ORDER — TADALAFIL 20 MG PO TABS: 20.0000 mg | ORAL_TABLET | Freq: Every day | ORAL | Status: DC | PRN

## 2012-12-28 MED ORDER — TRAMADOL HCL 50 MG PO TABS: 50.0000 mg | ORAL_TABLET | Freq: Four times a day (QID) | ORAL | Status: DC | PRN

## 2012-12-28 NOTE — Progress Notes (Signed)
Subjective:    Patient ID: Joshua Ludwig Sr., male    DOB: [birth year], 62 y.o.   MRN: [id number]  HPI  Here for wellness and f/u;  Overall doing ok;  Pt denies CP, worsening SOB, DOE, wheezing, orthopnea, PND, worsening LE edema, palpitations, dizziness or syncope.  Pt denies neurological change such as new headache, facial or extremity weakness.  Pt denies polydipsia, polyuria, or low sugar symptoms. Pt states overall good compliance with treatment and medications, good tolerability, and has been trying to follow lower cholesterol diet.  Pt denies worsening depressive symptoms, suicidal ideation or panic. No fever, night sweats, wt loss, loss of appetite, or other constitutional symptoms.  Pt states good ability with ADL's, has low fall risk, home safety reviewed and adequate, no other significant changes in hearing or vision, and only occasionally active with exercise.  Still with ongoing bilat knee pain, needs tramadol refill.  Also S/p gastric bypass, had low wt 195, at home usually now 225 -230, peak wt hx of 385.Pt continues to have recurring LBP without change in severity, bowel or bladder change, fever, wt loss,  worsening LE pain/numbness/weakness, gait change or falls, needs pain med refill.  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  Has hx of nsaid related ulcer.   Past Medical History  Diagnosis Date  . Acute pharyngitis 01/26/2009  . ALLERGIC RHINITIS 08/07/2007  . ARTHRITIS 08/07/2007  . BACK PAIN 09/15/2009  . BENIGN PROSTATIC HYPERTROPHY 05/20/2008  . EATING DISORDER, HX OF 08/07/2007  . ERECTILE DYSFUNCTION 02/18/2008  . HYPERLIPIDEMIA 08/07/2007  . HYPERTENSION 08/07/2007  . NECK PAIN, RIGHT 09/15/2009  . 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 SINUSITIS- ACUTE-NOS 04/30/2010   Past Surgical History  Procedure Laterality Date  . Gastric bypass  2006  . Lower back  2004  . Left foot  surgery  2008  . Right knee arthroscopy  1994    reports that he has never smoked. He does not have any smokeless tobacco history on file. He reports that he does not drink alcohol or use illicit drugs. family history includes Heart disease in his father; Hyperlipidemia in his father; Hypertension in his father. Allergies  Allergen Reactions  . Ace Inhibitors     REACTION: tongue swelling   Current Outpatient Prescriptions on File Prior to Visit  Medication Sig Dispense Refill  . acetaminophen (TYLENOL ARTHRITIS PAIN) 650 MG CR tablet Take 500 mg by mouth every 8 (eight) hours as needed.       Marland Kitchen aspirin 81 MG tablet Take 81 mg by mouth daily.        Marland Kitchen desloratadine (CLARINEX) 5 MG tablet Take 1 tablet (5 mg total) by mouth daily.  90 tablet  2  . flintstones complete (FLINTSTONES) 60 MG chewable tablet Chew 1 tablet by mouth daily.        . fluticasone (FLONASE) 50 MCG/ACT nasal spray Place 2 sprays into the nose daily.  16 g  2  . gabapentin (NEURONTIN) 100 MG capsule Take 1 capsule (100 mg total) by mouth 3 (three) times daily.  90 capsule  3  . HYDROcodone-homatropine (HYCODAN) 5-1.5 MG/5ML syrup Take 5 mLs by mouth every 6 (six) hours as needed for cough.  120 mL  1  . Omega-3 Krill Oil 500 MG CAPS Take 1 capsule by mouth daily.      . simvastatin (ZOCOR) 40 MG tablet Take 1  tablet (40 mg total) by mouth every evening.  90 tablet  2  . zolpidem (AMBIEN CR) 12.5 MG CR tablet Take 1 tablet (12.5 mg total) by mouth at bedtime as needed.  90 tablet  1   No current facility-administered medications on file prior to visit.    Review of Systems Constitutional: Negative for diaphoresis, activity change, appetite change or unexpected weight change.  HENT: Negative for hearing loss, ear pain, facial swelling, mouth sores and neck stiffness.   Eyes: Negative for pain, redness and visual disturbance.  Respiratory: Negative for shortness of breath and wheezing.   Cardiovascular: Negative for  chest pain and palpitations.  Gastrointestinal: Negative for diarrhea, blood in stool, abdominal distention or other pain Genitourinary: Negative for hematuria, flank pain or change in urine volume.  Musculoskeletal: Negative for myalgias and joint swelling.  Skin: Negative for color change and wound.  Neurological: Negative for syncope and numbness. other than noted Hematological: Negative for adenopathy.  Psychiatric/Behavioral: Negative for hallucinations, self-injury, decreased concentration and agitation.      Objective:   Physical Exam BP 122/80  Pulse 78  Temp(Src) 97.8 F (36.6 C) (Oral)  Ht 5' 11.5" (1.816 m)  Wt 235 lb 8 oz (106.822 kg)  BMI 32.39 kg/m2  SpO2 95% VS noted,  Constitutional: Pt is oriented to person, place, and time. Appears well-developed and well-nourished. Lavella Lemons Head: Normocephalic and atraumatic.  Right Ear: External ear normal.  Left Ear: External ear normal.  Nose: Nose normal.  Mouth/Throat: Oropharynx is clear and moist.  Eyes: Conjunctivae and EOM are normal. Pupils are equal, round, and reactive to light.  Neck: Normal range of motion. Neck supple. No JVD present. No tracheal deviation present.  Cardiovascular: Normal rate, regular rhythm, normal heart sounds and intact distal pulses.   Pulmonary/Chest: Effort normal and breath sounds normal.  Abdominal: Soft. Bowel sounds are normal. There is no tenderness. No HSM  Musculoskeletal: Normal range of motion. Exhibits no edema.  Lymphadenopathy:  Has no cervical adenopathy.  Neurological: Pt is alert and oriented to person, place, and time. Pt has normal reflexes. No cranial nerve deficit.  Skin: Skin is warm and dry. No rash noted.  Psychiatric:  Has  normal mood and affect. Behavior is normal.     Assessment & Plan:

## 2012-12-28 NOTE — Assessment & Plan Note (Signed)

## 2012-12-28 NOTE — Patient Instructions (Addendum)
You had the flu shot today, and the shingles shot Please take all new medication as prescribed - the voltaren gel Please continue all other medications as before, and refills have been done if requested Please have the pharmacy call with any other refills you may need. Please continue your efforts at being more active, low cholesterol diet, and weight control. Please keep your appointments with your specialists as you may have planned You are otherwise up to date with prevention measures today.  Please remember to sign up for My Chart if you have not done so, as this will be important to you in the future with finding out test results, communicating by private email, and scheduling acute appointments online when needed.  Please return in 1 year for your yearly visit, or sooner if needed, with Lab testing done 3-5 days before

## 2012-12-30 NOTE — Assessment & Plan Note (Signed)
For pain med refill 

## 2013-04-02 ENCOUNTER — Encounter: Payer: Self-pay | Admitting: Internal Medicine

## 2013-04-03 ENCOUNTER — Other Ambulatory Visit: Payer: Self-pay

## 2013-04-03 MED ORDER — DESLORATADINE 5 MG PO TABS: 5.0000 mg | ORAL_TABLET | Freq: Every day | ORAL | Status: DC

## 2013-04-03 MED ORDER — TRAMADOL HCL 50 MG PO TABS: 50.0000 mg | ORAL_TABLET | Freq: Four times a day (QID) | ORAL | Status: DC | PRN

## 2013-04-03 MED ORDER — SIMVASTATIN 40 MG PO TABS: 40.0000 mg | ORAL_TABLET | Freq: Every evening | ORAL | Status: DC

## 2013-04-03 NOTE — Telephone Encounter (Signed)
Faxed hardcopy to [facility] [place] 

## 2013-04-03 NOTE — Telephone Encounter (Signed)
Done hardcopy to robin  

## 2013-12-25 ENCOUNTER — Other Ambulatory Visit: Payer: Self-pay

## 2013-12-25 MED ORDER — TRAMADOL HCL 50 MG PO TABS: 50.0000 mg | ORAL_TABLET | Freq: Four times a day (QID) | ORAL | Status: DC | PRN

## 2013-12-25 MED ORDER — TADALAFIL 20 MG PO TABS: 20.0000 mg | ORAL_TABLET | Freq: Every day | ORAL | Status: DC | PRN

## 2013-12-25 NOTE — Telephone Encounter (Signed)
Faxed hardcopy for both Tramadol and Cialis to [facility] [place]

## 2013-12-25 NOTE — Telephone Encounter (Signed)
Also patient is requesting a refill for Cialis 20 mg.  Not on current list and patients last office visit 12/28/12.

## 2013-12-25 NOTE — Telephone Encounter (Signed)
Both - Done hardcopy to robin

## 2014-02-24 ENCOUNTER — Other Ambulatory Visit: Payer: Self-pay | Admitting: Internal Medicine

## 2014-04-23 ENCOUNTER — Other Ambulatory Visit: Payer: Self-pay | Admitting: Internal Medicine

## 2014-05-01 ENCOUNTER — Encounter: Payer: Self-pay | Admitting: Internal Medicine

## 2014-05-01 ENCOUNTER — Ambulatory Visit (INDEPENDENT_AMBULATORY_CARE_PROVIDER_SITE_OTHER): Payer: BLUE CROSS/BLUE SHIELD | Admitting: Internal Medicine

## 2014-05-01 VITALS — BP 162/110 | HR 77 | Temp 99.1°F | Resp 18 | Ht 71.5 in | Wt 252.0 lb

## 2014-05-01 DIAGNOSIS — Z0000 Encounter for general adult medical examination without abnormal findings: Secondary | ICD-10-CM

## 2014-05-01 DIAGNOSIS — F528 Other sexual dysfunction not due to a substance or known physiological condition: Secondary | ICD-10-CM

## 2014-05-01 DIAGNOSIS — I10 Essential (primary) hypertension: Secondary | ICD-10-CM

## 2014-05-01 MED ORDER — AMLODIPINE BESYLATE 10 MG PO TABS: 10.0000 mg | ORAL_TABLET | Freq: Every day | ORAL | Status: DC

## 2014-05-01 MED ORDER — HYDROCHLOROTHIAZIDE 12.5 MG PO CAPS: 12.5000 mg | ORAL_CAPSULE | Freq: Every day | ORAL | Status: DC

## 2014-05-01 NOTE — Addendum Note (Signed)
Addended by: Earnstine Regal on: 05/01/2014 04:59 PM   Modules accepted: Orders

## 2014-05-01 NOTE — Progress Notes (Signed)
Pre visit review using our clinic review tool, if applicable. No additional management support is needed unless otherwise documented below in the visit note. 

## 2014-05-01 NOTE — Patient Instructions (Signed)
Please take all new medication as prescribed - the amlodipine 10 mg per day, and the HCT 12.5 mg per day  Please continue all other medications as before, and refills have been done if requested.  Please have the pharmacy call with any other refills you may need.  Please continue your efforts at being more active, low cholesterol diet, and weight control.  You are otherwise up to date with prevention measures today.  Please keep your appointments with your specialists as you may have planned  Please go to the LAB in the Basement (turn left off the elevator) for the tests to be done today  You will be contacted by phone if any changes need to be made immediately.  Otherwise, you will receive a letter about your results with an explanation, but please check with MyChart first.  Please remember to sign up for MyChart if you have not done so, as this will be important to you in the future with finding out test results, communicating by private email, and scheduling acute appointments online when needed.  Please return in 1 month, or sooner if needed

## 2014-05-01 NOTE — Assessment & Plan Note (Signed)
Active uncontrolled again with recent wt gain, for amlod 10 qd, and hct 12.5, f/u 1 month

## 2014-05-01 NOTE — Progress Notes (Signed)
Subjective:    Patient ID: Denton Meek Sr., male    DOB: [birth year], 64 y.o.   MRN: [id number]  HPI  Here for wellness and f/u;  Overall doing ok;  Pt denies CP, worsening SOB, DOE, wheezing, orthopnea, PND, worsening LE edema, palpitations, dizziness or syncope.  Pt denies neurological change such as new headache, facial or extremity weakness.  Pt denies polydipsia, polyuria, or low sugar symptoms. Pt states overall good compliance with treatment and medications, good tolerability, and has been trying to follow lower cholesterol diet.  Pt denies worsening depressive symptoms, suicidal ideation or panic. No fever, night sweats, wt loss, loss of appetite, or other constitutional symptoms.  Pt states good ability with ADL's, has low fall risk, home safety reviewed and adequate, no other significant changes in hearing or vision, and only occasionally active with exercise.  S/p gastric bypass,m has regained some wt, and now BP elevated as well   Wt Readings from Last 3 Encounters:  05/01/14 252 lb (114.306 kg)  12/28/12 235 lb 8 oz (106.822 kg)  07/04/12 235 lb 2 oz (106.652 kg)  Pt continues to have recurring LBP without change in severity, bowel or bladder change, fever, wt loss,  worsening LE pain/numbness/weakness, gait change or falls.  Does fatigue quickly, asks for testosterone check. Chronic arthritic pain overall no change, takes the tramadol once dialy with tylenol only., asks fo rrefill Has taken cialis in past, also has used a constriction band.   Past Medical History  Diagnosis Date  . Acute pharyngitis 01/26/2009  . ALLERGIC RHINITIS 08/07/2007  . ARTHRITIS 08/07/2007  . BACK PAIN 09/15/2009  . BENIGN PROSTATIC HYPERTROPHY 05/20/2008  . EATING DISORDER, HX OF 08/07/2007  . ERECTILE DYSFUNCTION 02/18/2008  . HYPERLIPIDEMIA 08/07/2007  . HYPERTENSION 08/07/2007  . NECK PAIN, RIGHT 09/15/2009  . 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 SINUSITIS- ACUTE-NOS 04/30/2010   Past Surgical  History  Procedure Laterality Date  . Gastric bypass  2006  . Lower back  2004  . Left foot surgery  2008  . Right knee arthroscopy  1994    reports that he has never smoked. He does not have any smokeless tobacco history on file. He reports that he does not drink alcohol or use illicit drugs. family history includes Heart disease in his father; Hyperlipidemia in his father; Hypertension in his father. Allergies  Allergen Reactions  . Ace Inhibitors     REACTION: tongue swelling   Current Outpatient Prescriptions on File Prior to Visit  Medication Sig Dispense Refill  . acetaminophen (TYLENOL ARTHRITIS PAIN) 650 MG CR tablet Take 500 mg by mouth every 8 (eight) hours as needed.     Marland Kitchen aspirin 81 MG tablet Take 81 mg by mouth daily.      Marland Kitchen desloratadine (CLARINEX) 5 MG tablet Take 1 tablet (5 mg total) by mouth daily. 90 tablet 3  . diclofenac sodium (VOLTAREN) 1 % GEL Apply 4 g topically 4 (four) times daily as needed. 100 g 11  . flintstones complete (FLINTSTONES) 60 MG chewable tablet Chew 1 tablet by mouth daily.      . fluticasone (FLONASE) 50 MCG/ACT nasal spray Place 2 sprays into the nose daily. 16 g 2  . gabapentin (NEURONTIN) 100 MG capsule Take 1 capsule (100 mg total) by mouth 3 (three) times daily. 90 capsule 3  . Omega-3 Krill Oil 500 MG CAPS Take 1 capsule by mouth daily.    . simvastatin (ZOCOR) 40 MG tablet TAKE 1 TABLET  EVERY EVENING 90 tablet 0  . tadalafil (CIALIS) 20 MG tablet Take 1 tablet (20 mg total) by mouth daily as needed. For the medical condition:  Erectile dysfunction 10 tablet 11  . traMADol (ULTRAM) 50 MG tablet Take 1 tablet (50 mg total) by mouth every 6 (six) hours as needed. 120 tablet 0  . zolpidem (AMBIEN CR) 12.5 MG CR tablet Take 1 tablet (12.5 mg total) by mouth at bedtime as needed. 90 tablet 1   No current facility-administered medications on file prior to visit.   Review of Systems Constitutional: Negative for increased diaphoresis, other  activity, appetite or other siginficant weight change  HENT: Negative for worsening hearing loss, ear pain, facial swelling, mouth sores and neck stiffness.   Eyes: Negative for other worsening pain, redness or visual disturbance.  Respiratory: Negative for shortness of breath and wheezing.   Cardiovascular: Negative for chest pain and palpitations.  Gastrointestinal: Negative for diarrhea, blood in stool, abdominal distention or other pain Genitourinary: Negative for hematuria, flank pain or change in urine volume.  Musculoskeletal: Negative for myalgias or other joint complaints.  Skin: Negative for color change and wound.  Neurological: Negative for syncope and numbness. other than noted Hematological: Negative for adenopathy. or other swelling Psychiatric/Behavioral: Negative for hallucinations, self-injury, decreased concentration or other worsening agitation.      Objective:   Physical Exam BP 162/110 mmHg  Pulse 77  Temp(Src) 99.1 F (37.3 C) (Oral)  Resp 18  Ht 5' 11.5" (1.816 m)  Wt 252 lb (114.306 kg)  BMI 34.66 kg/m2  SpO2 96% VS noted,  Constitutional: Pt is oriented to person, place, and time. Appears well-developed and well-nourished.  Head: Normocephalic and atraumatic.  Right Ear: External ear normal.  Left Ear: External ear normal.  Nose: Nose normal.  Mouth/Throat: Oropharynx is clear and moist.  Eyes: Conjunctivae and EOM are normal. Pupils are equal, round, and reactive to light.  Neck: Normal range of motion. Neck supple. No JVD present. No tracheal deviation present.  Cardiovascular: Normal rate, regular rhythm, normal heart sounds and intact distal pulses.   Pulmonary/Chest: Effort normal and breath sounds without rales or wheezing  Abdominal: Soft. Bowel sounds are normal. NT. No HSM  Musculoskeletal: Normal range of motion. Exhibits no edema.  Lymphadenopathy:  Has no cervical adenopathy.  Neurological: Pt is alert and oriented to person, place, and  time. Pt has normal reflexes. No cranial nerve deficit. Motor grossly intact Skin: Skin is warm and dry. No rash noted.  Psychiatric:  Has normal mood and affect. Behavior is normal.      Assessment & Plan:

## 2014-05-01 NOTE — Assessment & Plan Note (Signed)
Ok for cialis refill, also check tesosterone

## 2014-05-01 NOTE — Assessment & Plan Note (Signed)

## 2014-05-01 NOTE — Addendum Note (Signed)
Addended by: Earnstine Regal on: 05/01/2014 04:58 PM   Modules accepted: Orders

## 2014-05-03 ENCOUNTER — Telehealth: Payer: Self-pay | Admitting: Internal Medicine

## 2014-05-03 ENCOUNTER — Encounter: Payer: Self-pay | Admitting: Internal Medicine

## 2014-05-06 ENCOUNTER — Other Ambulatory Visit: Payer: Self-pay | Admitting: Internal Medicine

## 2014-05-06 MED ORDER — TRAMADOL HCL 50 MG PO TABS: 50.0000 mg | ORAL_TABLET | Freq: Four times a day (QID) | ORAL | Status: DC | PRN

## 2014-05-06 NOTE — Telephone Encounter (Signed)
Done hardcopy to Cherina  

## 2014-05-06 NOTE — Telephone Encounter (Signed)
Already done

## 2014-05-07 ENCOUNTER — Telehealth: Payer: Self-pay | Admitting: Internal Medicine

## 2014-05-07 NOTE — Telephone Encounter (Signed)
Pt called stated that [facility] [address] does not [facility] the traMADol (ULTRAM) 50 MG tablet . Please refax this to them.

## 2014-05-07 NOTE — Telephone Encounter (Signed)
Rx sent to pharmacy   

## 2014-05-12 ENCOUNTER — Telehealth: Payer: Self-pay | Admitting: Internal Medicine

## 2014-05-13 NOTE — Telephone Encounter (Signed)
Too soon - rx tramadol already done mar 1

## 2014-05-14 ENCOUNTER — Other Ambulatory Visit: Payer: Self-pay | Admitting: Internal Medicine

## 2014-05-14 ENCOUNTER — Other Ambulatory Visit (INDEPENDENT_AMBULATORY_CARE_PROVIDER_SITE_OTHER): Payer: BLUE CROSS/BLUE SHIELD

## 2014-05-14 DIAGNOSIS — Z0000 Encounter for general adult medical examination without abnormal findings: Secondary | ICD-10-CM

## 2014-05-14 DIAGNOSIS — F528 Other sexual dysfunction not due to a substance or known physiological condition: Secondary | ICD-10-CM

## 2014-05-14 LAB — BASIC METABOLIC PANEL
BUN: 21 mg/dL (ref 6–23)
CHLORIDE: 103 meq/L (ref 96–112)
CO2: 31 meq/L (ref 19–32)
Calcium: 9.6 mg/dL (ref 8.4–10.5)
Creatinine, Ser: 0.86 mg/dL (ref 0.40–1.50)
GFR: 115.38 mL/min (ref >60.00)
Glucose, Bld: 106 mg/dL — ABNORMAL HIGH (ref 70–99)
Potassium: 3.7 mEq/L (ref 3.5–5.1)
SODIUM: 138 meq/L (ref 135–145)

## 2014-05-14 LAB — LIPID PANEL
Cholesterol: 163 mg/dL (ref 0–200)
HDL: 56.8 mg/dL (ref >39.00)
LDL Cholesterol: 90 mg/dL (ref 0–99)
NonHDL: 106.2
Total CHOL/HDL Ratio: 3
Triglycerides: 83 mg/dL (ref 0.0–149.0)
VLDL: 16.6 mg/dL (ref 0.0–40.0)

## 2014-05-14 LAB — URINALYSIS, ROUTINE W REFLEX MICROSCOPIC
Bilirubin Urine: NEGATIVE
Hgb urine dipstick: NEGATIVE
KETONES UR: NEGATIVE
LEUKOCYTES UA: NEGATIVE
Nitrite: NEGATIVE
PH: 5.5 (ref 5.0–8.0)
RBC / HPF: NONE SEEN (ref 0–?)
Specific Gravity, Urine: >=1.03 — AB (ref 1.000–1.030)
Total Protein, Urine: NEGATIVE
URINE GLUCOSE: NEGATIVE
UROBILINOGEN UA: 0.2 (ref 0.0–1.0)

## 2014-05-14 LAB — CBC WITH DIFFERENTIAL/PLATELET
Basophils Absolute: 0 10*3/uL (ref 0.0–0.1)
Basophils Relative: 0.5 % (ref 0.0–3.0)
EOS ABS: 0.1 10*3/uL (ref 0.0–0.7)
EOS PCT: 1.3 % (ref 0.0–5.0)
HCT: 44.3 % (ref 39.0–52.0)
HEMOGLOBIN: 15 g/dL (ref 13.0–17.0)
LYMPHS ABS: 1.1 10*3/uL (ref 0.7–4.0)
Lymphocytes Relative: 17 % (ref 12.0–46.0)
MCHC: 33.9 g/dL (ref 30.0–36.0)
MCV: 87.5 fl (ref 78.0–100.0)
MONOS PCT: 7.9 % (ref 3.0–12.0)
Monocytes Absolute: 0.5 10*3/uL (ref 0.1–1.0)
Neutro Abs: 4.8 10*3/uL (ref 1.4–7.7)
Neutrophils Relative %: 73.3 % (ref 43.0–77.0)
PLATELETS: 208 10*3/uL (ref 150.0–400.0)
RBC: 5.07 Mil/uL (ref 4.22–5.81)
RDW: 13.2 % (ref 11.5–15.5)
WBC: 6.5 10*3/uL (ref 4.0–10.5)

## 2014-05-14 LAB — TESTOSTERONE: TESTOSTERONE: 400.22 ng/dL (ref 300.00–890.00)

## 2014-05-14 LAB — HEPATIC FUNCTION PANEL
ALT: 16 U/L (ref 0–53)
AST: 20 U/L (ref 0–37)
Albumin: 4.2 g/dL (ref 3.5–5.2)
Alkaline Phosphatase: 70 U/L (ref 39–117)
BILIRUBIN DIRECT: 0.1 mg/dL (ref 0.0–0.3)
Total Bilirubin: 0.5 mg/dL (ref 0.2–1.2)
Total Protein: 6.8 g/dL (ref 6.0–8.3)

## 2014-05-14 LAB — TSH: TSH: 0.65 u[IU]/mL (ref 0.35–4.50)

## 2014-05-14 LAB — PSA: PSA: 1.52 ng/mL (ref 0.10–4.00)

## 2014-05-14 NOTE — Telephone Encounter (Signed)
Same answer - still too soon b/c Already done mar 1 and given hardcopy to cherina

## 2014-05-14 NOTE — Telephone Encounter (Signed)
Pt would like a paper script for Tramadol.  Pt states his pharmacy said his prior script has expired.

## 2014-05-15 ENCOUNTER — Encounter: Payer: Self-pay | Admitting: Internal Medicine

## 2014-05-15 NOTE — Telephone Encounter (Signed)
Spoke with patient about medication. Chart shows that [facility] [address] [facility] [place]. Pharmacy states it was denied by the doctor on the 2nd. Will attempt to call the pharmacy today.

## 2014-05-15 NOTE — Telephone Encounter (Signed)
Left message with wife for him to call back

## 2014-05-15 NOTE — Telephone Encounter (Signed)
Is there a way to reprint the prescription from March 1st? Apparently [facility] did not get the [facility] on March 1st. I remember faxing this RX and waiting for it to go through and it do so correctly. This is why you have been getting numerous RX requests from them about this patient. Not sure what else to do.

## 2014-05-15 NOTE — Telephone Encounter (Signed)
For third day this wk, this is still too early as he just had a rx done mar 1

## 2014-05-16 MED ORDER — TRAMADOL HCL 50 MG PO TABS: 50.0000 mg | ORAL_TABLET | Freq: Four times a day (QID) | ORAL | Status: DC | PRN

## 2014-05-16 NOTE — Telephone Encounter (Signed)
Done hardcopy to [facility]  Not sure this can be faxed and accepted  May need to be picked up by patient

## 2014-05-16 NOTE — Telephone Encounter (Signed)
Pt. Is aware and will pick RX today.

## 2014-05-29 ENCOUNTER — Encounter: Payer: Self-pay | Admitting: Internal Medicine

## 2014-05-29 ENCOUNTER — Ambulatory Visit (INDEPENDENT_AMBULATORY_CARE_PROVIDER_SITE_OTHER): Payer: BLUE CROSS/BLUE SHIELD | Admitting: Internal Medicine

## 2014-05-29 VITALS — BP 130/86 | HR 79 | Temp 98.3°F | Resp 18 | Ht 72.0 in | Wt 254.1 lb

## 2014-05-29 DIAGNOSIS — R972 Elevated prostate specific antigen [PSA]: Secondary | ICD-10-CM

## 2014-05-29 DIAGNOSIS — Z0000 Encounter for general adult medical examination without abnormal findings: Secondary | ICD-10-CM | POA: Diagnosis not present

## 2014-05-29 DIAGNOSIS — J309 Allergic rhinitis, unspecified: Secondary | ICD-10-CM

## 2014-05-29 DIAGNOSIS — I10 Essential (primary) hypertension: Secondary | ICD-10-CM

## 2014-05-29 MED ORDER — TADALAFIL 20 MG PO TABS
20.0000 mg | ORAL_TABLET | Freq: Every day | ORAL | Status: DC | PRN
Start: 2014-05-29 — End: 2015-01-09

## 2014-05-29 MED ORDER — DESLORATADINE 5 MG PO TABS: 5.0000 mg | ORAL_TABLET | Freq: Every day | ORAL | Status: DC

## 2014-05-29 NOTE — Assessment & Plan Note (Signed)

## 2014-05-29 NOTE — Assessment & Plan Note (Signed)
Felida for clarinex as requested

## 2014-05-29 NOTE — Progress Notes (Signed)
Subjective:    Patient ID: Joshua Meek Sr., male    DOB: 1950-04-23, 64 y.o.   MRN: [id number]  HPI  Here for wellness and f/u;  Overall doing ok;  Pt denies Chest pain, worsening SOB, DOE, wheezing, orthopnea, PND, worsening LE edema, palpitations, dizziness or syncope.  Pt denies neurological change such as new headache, facial or extremity weakness.  Pt denies polydipsia, polyuria, or low sugar symptoms. Pt states overall good compliance with treatment and medications, good tolerability, and has been trying to follow appropriate diet.  Pt denies worsening depressive symptoms, suicidal ideation or panic. No fever, night sweats, wt loss, loss of appetite, or other constitutional symptoms.  Pt states good ability with ADL's, has low fall risk, home safety reviewed and adequate, no other significant changes in hearing or vision, and only occasionally active with exercise. Eyes watering , asks for clarinex rx. Needs cialis refill. Denies urinary symptoms such as dysuria, frequency, urgency, flank pain, hematuria or n/v, fever, chills. Past Medical History  Diagnosis Date  . Acute pharyngitis 01/26/2009  . ALLERGIC RHINITIS 08/07/2007  . ARTHRITIS 08/07/2007  . BACK PAIN 09/15/2009  . BENIGN PROSTATIC HYPERTROPHY 05/20/2008  . EATING DISORDER, HX OF 08/07/2007  . ERECTILE DYSFUNCTION 02/18/2008  . HYPERLIPIDEMIA 08/07/2007  . HYPERTENSION 08/07/2007  . NECK PAIN, RIGHT 09/15/2009  . 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 SINUSITIS- ACUTE-NOS 04/30/2010   Past Surgical History  Procedure Laterality Date  . Gastric bypass  2006  . Lower back  2004  . Left foot surgery  2008  . Right knee arthroscopy  1994    reports that he has never smoked. He does not have any smokeless tobacco history on file. He reports that he does not drink alcohol or use illicit drugs. family history includes Heart disease in his father; Hyperlipidemia in his father; Hypertension in his father. Allergies  Allergen  Reactions  . Ace Inhibitors     REACTION: tongue swelling   Current Outpatient Prescriptions on File Prior to Visit  Medication Sig Dispense Refill  . acetaminophen (TYLENOL ARTHRITIS PAIN) 650 MG CR tablet Take 500 mg by mouth every 8 (eight) hours as needed.     Marland Kitchen amLODipine (NORVASC) 10 MG tablet Take 1 tablet (10 mg total) by mouth daily. 90 tablet 3  . aspirin 81 MG tablet Take 81 mg by mouth daily.      . diclofenac sodium (VOLTAREN) 1 % GEL Apply 4 g topically 4 (four) times daily as needed. 100 g 11  . flintstones complete (FLINTSTONES) 60 MG chewable tablet Chew 1 tablet by mouth daily.      . fluticasone (FLONASE) 50 MCG/ACT nasal spray Place 2 sprays into the nose daily. 16 g 2  . gabapentin (NEURONTIN) 100 MG capsule Take 1 capsule (100 mg total) by mouth 3 (three) times daily. 90 capsule 3  . hydrochlorothiazide (MICROZIDE) 12.5 MG capsule Take 1 capsule (12.5 mg total) by mouth daily. 90 capsule 3  . Omega-3 Krill Oil 500 MG CAPS Take 1 capsule by mouth daily.    . simvastatin (ZOCOR) 40 MG tablet TAKE 1 TABLET EVERY EVENING 90 tablet 0  . traMADol (ULTRAM) 50 MG tablet Take 1 tablet (50 mg total) by mouth every 6 (six) hours as needed. 120 tablet 0  . zolpidem (AMBIEN CR) 12.5 MG CR tablet Take 1 tablet (12.5 mg total) by mouth at bedtime as needed. 90 tablet 1   No current facility-administered medications on file prior to visit.  Review of Systems Constitutional: Negative for increased diaphoresis, other activity, appetite or siginficant weight change other than noted HENT: Negative for worsening hearing loss, ear pain, facial swelling, mouth sores and neck stiffness.   Eyes: Negative for other worsening pain, redness or visual disturbance.  Respiratory: Negative for shortness of breath and wheezing  Cardiovascular: Negative for chest pain and palpitations.  Gastrointestinal: Negative for diarrhea, blood in stool, abdominal distention or other pain Genitourinary:  Negative for hematuria, flank pain or change in urine volume.  Musculoskeletal: Negative for myalgias or other joint complaints.  Skin: Negative for color change and wound or drainage.  Neurological: Negative for syncope and numbness. other than noted Hematological: Negative for adenopathy. or other swelling Psychiatric/Behavioral: Negative for hallucinations, SI, self-injury, decreased concentration or other worsening agitation.      Objective:   Physical Exam BP 130/86 mmHg  Pulse 79  Temp(Src) 98.3 F (36.8 C) (Oral)  Resp 18  Ht 6' (1.829 m)  Wt 254 lb 1.9 oz (115.268 kg)  BMI 34.46 kg/m2  SpO2 95% BP Readings from Last 3 Encounters:  05/29/14 130/86  05/01/14 162/110  12/28/12 122/80  VS noted,  Constitutional: Pt is oriented to person, place, and time. Appears well-developed and well-nourished, in no significant distress Head: Normocephalic and atraumatic.  Right Ear: External ear normal.  Left Ear: External ear normal.  Nose: Nose normal.  Mouth/Throat: Oropharynx is clear and moist.  Eyes: Conjunctivae and EOM are normal. Pupils are equal, round, and reactive to light.  Neck: Normal range of motion. Neck supple. No JVD present. No tracheal deviation present or significant neck LA or mass Cardiovascular: Normal rate, regular rhythm, normal heart sounds and intact distal pulses.   Pulmonary/Chest: Effort normal and breath sounds without rales or wheezing  Abdominal: Soft. Bowel sounds are normal. NT. No HSM  Musculoskeletal: Normal range of motion. Exhibits no edema.  Lymphadenopathy:  Has no cervical adenopathy.  Neurological: Pt is alert and oriented to person, place, and time. Pt has normal reflexes. No cranial nerve deficit. Motor grossly intact Skin: Skin is warm and dry. No rash noted.  Psychiatric:  Has normal mood and affect. Behavior is normal.      Assessment & Plan:

## 2014-05-29 NOTE — Patient Instructions (Addendum)
Please take all new medication as prescribed - the clarinex  Please continue all other medications as before, and refills have been done if requested - the cialis  Please have the pharmacy call with any other refills you may need.  Please continue your efforts at being more active, low cholesterol diet, and weight control.  You are otherwise up to date with prevention measures today.  Please keep your appointments with your specialists as you may have planned  Please return in 6 months for LAB ONLY - the repeat PSA  Please return in 1 year for your yearly visit, or sooner if needed, with Lab testing done 3-5 days before

## 2014-05-29 NOTE — Assessment & Plan Note (Signed)
With mild increased velocity, unclear clinical significance, for f/u psa at 6 mo - lab only

## 2014-05-29 NOTE — Assessment & Plan Note (Signed)
Improved, cont wt loss efforts, current meds, low salt, excercise

## 2014-05-29 NOTE — Progress Notes (Signed)
Pre visit review using our clinic review tool, if applicable. No additional management support is needed unless otherwise documented below in the visit note. 

## 2014-07-25 ENCOUNTER — Other Ambulatory Visit: Payer: Self-pay

## 2014-07-25 MED ORDER — SIMVASTATIN 40 MG PO TABS: 40.0000 mg | ORAL_TABLET | Freq: Every evening | ORAL | Status: DC

## 2014-07-25 MED ORDER — TRAMADOL HCL 50 MG PO TABS: 50.0000 mg | ORAL_TABLET | Freq: Four times a day (QID) | ORAL | Status: DC | PRN

## 2014-07-25 NOTE — Telephone Encounter (Signed)
Rx faxed to [facility].

## 2014-07-25 NOTE — Telephone Encounter (Signed)
Done hardcopy to Dahlia  

## 2014-07-25 NOTE — Telephone Encounter (Signed)
Please advise on Tramadol refill request

## 2014-12-10 ENCOUNTER — Other Ambulatory Visit: Payer: Self-pay

## 2014-12-10 MED ORDER — TRAMADOL HCL 50 MG PO TABS: 50.0000 mg | ORAL_TABLET | Freq: Two times a day (BID) | ORAL | Status: DC | PRN

## 2014-12-10 NOTE — Telephone Encounter (Signed)
Rx faxed to pharmacy  

## 2014-12-10 NOTE — Telephone Encounter (Signed)
Will approve for 30 pills only which appears to be a 30 day supply since not filled since 07/25/14. Printed and signed.

## 2015-01-09 ENCOUNTER — Other Ambulatory Visit (INDEPENDENT_AMBULATORY_CARE_PROVIDER_SITE_OTHER): Payer: BLUE CROSS/BLUE SHIELD

## 2015-01-09 ENCOUNTER — Encounter: Payer: Self-pay | Admitting: Internal Medicine

## 2015-01-09 ENCOUNTER — Ambulatory Visit (INDEPENDENT_AMBULATORY_CARE_PROVIDER_SITE_OTHER): Payer: BLUE CROSS/BLUE SHIELD | Admitting: Internal Medicine

## 2015-01-09 VITALS — BP 124/82 | HR 81 | Temp 98.2°F | Ht 72.0 in | Wt 258.0 lb

## 2015-01-09 DIAGNOSIS — R972 Elevated prostate specific antigen [PSA]: Secondary | ICD-10-CM | POA: Insufficient documentation

## 2015-01-09 DIAGNOSIS — I10 Essential (primary) hypertension: Secondary | ICD-10-CM | POA: Diagnosis not present

## 2015-01-09 DIAGNOSIS — R310 Gross hematuria: Secondary | ICD-10-CM | POA: Diagnosis not present

## 2015-01-09 DIAGNOSIS — Z20828 Contact with and (suspected) exposure to other viral communicable diseases: Secondary | ICD-10-CM | POA: Diagnosis not present

## 2015-01-09 DIAGNOSIS — Z23 Encounter for immunization: Secondary | ICD-10-CM | POA: Diagnosis not present

## 2015-01-09 DIAGNOSIS — Z0000 Encounter for general adult medical examination without abnormal findings: Secondary | ICD-10-CM

## 2015-01-09 DIAGNOSIS — R319 Hematuria, unspecified: Secondary | ICD-10-CM | POA: Diagnosis not present

## 2015-01-09 LAB — HEPATIC FUNCTION PANEL
ALK PHOS: 60 U/L (ref 39–117)
ALT: 16 U/L (ref 0–53)
AST: 20 U/L (ref 0–37)
Albumin: 3.9 g/dL (ref 3.5–5.2)
BILIRUBIN DIRECT: 0.2 mg/dL (ref 0.0–0.3)
Total Bilirubin: 0.8 mg/dL (ref 0.2–1.2)
Total Protein: 6.4 g/dL (ref 6.0–8.3)

## 2015-01-09 LAB — CBC WITH DIFFERENTIAL/PLATELET
BASOS PCT: 0.3 % (ref 0.0–3.0)
Basophils Absolute: 0 10*3/uL (ref 0.0–0.1)
EOS ABS: 0.1 10*3/uL (ref 0.0–0.7)
Eosinophils Relative: 1.5 % (ref 0.0–5.0)
HEMATOCRIT: 43.5 % (ref 39.0–52.0)
Hemoglobin: 14.6 g/dL (ref 13.0–17.0)
LYMPHS ABS: 0.7 10*3/uL (ref 0.7–4.0)
LYMPHS PCT: 9.1 % — AB (ref 12.0–46.0)
MCHC: 33.5 g/dL (ref 30.0–36.0)
MCV: 87.6 fl (ref 78.0–100.0)
Monocytes Absolute: 0.6 10*3/uL (ref 0.1–1.0)
Monocytes Relative: 7.5 % (ref 3.0–12.0)
NEUTROS ABS: 6.3 10*3/uL (ref 1.4–7.7)
Neutrophils Relative %: 81.6 % — ABNORMAL HIGH (ref 43.0–77.0)
PLATELETS: 207 10*3/uL (ref 150.0–400.0)
RBC: 4.96 Mil/uL (ref 4.22–5.81)
RDW: 13.6 % (ref 11.5–15.5)
WBC: 7.7 10*3/uL (ref 4.0–10.5)

## 2015-01-09 LAB — TSH: TSH: 0.57 u[IU]/mL (ref 0.35–4.50)

## 2015-01-09 LAB — POCT URINALYSIS DIPSTICK
Blood, UA: NEGATIVE
Glucose, UA: NEGATIVE
KETONES UA: NEGATIVE
Leukocytes, UA: NEGATIVE
Nitrite, UA: NEGATIVE
PROTEIN UA: 15
Urobilinogen, UA: 0.2
pH, UA: 6

## 2015-01-09 LAB — LIPID PANEL
CHOL/HDL RATIO: 3
Cholesterol: 159 mg/dL (ref 0–200)
HDL: 53 mg/dL (ref >39.00)
LDL Cholesterol: 90 mg/dL (ref 0–99)
NONHDL: 105.55
Triglycerides: 79 mg/dL (ref 0.0–149.0)
VLDL: 15.8 mg/dL (ref 0.0–40.0)

## 2015-01-09 LAB — PSA: PSA: 0.98 ng/mL (ref 0.10–4.00)

## 2015-01-09 LAB — URINALYSIS, ROUTINE W REFLEX MICROSCOPIC
BILIRUBIN URINE: NEGATIVE
HGB URINE DIPSTICK: NEGATIVE
Ketones, ur: NEGATIVE
LEUKOCYTES UA: NEGATIVE
NITRITE: NEGATIVE
Specific Gravity, Urine: 1.025 (ref 1.000–1.030)
TOTAL PROTEIN, URINE-UPE24: NEGATIVE
Urine Glucose: NEGATIVE
Urobilinogen, UA: 0.2 (ref 0.0–1.0)
WBC, UA: NONE SEEN (ref 0–?)
pH: 6 (ref 5.0–8.0)

## 2015-01-09 LAB — BASIC METABOLIC PANEL
BUN: 15 mg/dL (ref 6–23)
CHLORIDE: 105 meq/L (ref 96–112)
CO2: 31 mEq/L (ref 19–32)
CREATININE: 0.85 mg/dL (ref 0.40–1.50)
Calcium: 9.3 mg/dL (ref 8.4–10.5)
GFR: 116.71 mL/min (ref >60.00)
Glucose, Bld: 101 mg/dL — ABNORMAL HIGH (ref 70–99)
POTASSIUM: 3.7 meq/L (ref 3.5–5.1)
SODIUM: 142 meq/L (ref 135–145)

## 2015-01-09 MED ORDER — TRAMADOL HCL 50 MG PO TABS: 50.0000 mg | ORAL_TABLET | Freq: Two times a day (BID) | ORAL | Status: DC | PRN

## 2015-01-09 MED ORDER — TADALAFIL 20 MG PO TABS: 20.0000 mg | ORAL_TABLET | Freq: Every day | ORAL | Status: DC | PRN

## 2015-01-09 NOTE — Assessment & Plan Note (Signed)
Etiology unclear, possible simple blood related to straining to urinate in the condition at the time, but cant r/o other, for CT abd/pelvis no CM , referral urology

## 2015-01-09 NOTE — Patient Instructions (Addendum)
You had the flu shot today  Please continue all other medications as before, and refills have been done if requested.  Please have the pharmacy call with any other refills you may need.  Please continue your efforts at being more active, low cholesterol diet, and weight control.  You are otherwise up to date with prevention measures today.  Please keep your appointments with your specialists as you may have planned  You will be contacted regarding the referral for: CT scan, and urology  Please go to the LAB in the Basement (turn left off the elevator) for the tests to be done today  You will be contacted by phone if any changes need to be made immediately.  Otherwise, you will receive a letter about your results with an explanation, but please check with MyChart first.  Please remember to sign up for MyChart if you have not done so, as this will be important to you in the future with finding out test results, communicating by private email, and scheduling acute appointments online when needed.  Please return in 6 months, or sooner if needed, with Lab testing done 3-5 days before

## 2015-01-09 NOTE — Progress Notes (Signed)
Pre visit review using our clinic review tool, if applicable. No additional management support is needed unless otherwise documented below in the visit note. 

## 2015-01-09 NOTE — Progress Notes (Addendum)
Subjective:    Patient ID: Joshua Meek Sr., male    DOB: 24-Oct-1950, 64 y.o.   MRN: [id number]  HPI    Here with c/o one episode painless gross hematuria while attempting to urinate while also using the Actus restrictor device for ED.  None since that time, and Denies urinary symptoms such as dysuria, frequency, urgency, flank pain, hematuria or n/v, fever, chills.  Pt denies chest pain, increased sob or doe, wheezing, orthopnea, PND, increased LE swelling, palpitations, dizziness or syncope. Had slightly elevated velocity PSA last visit.  Due for Hep C screen. No other complaints Past Medical History  Diagnosis Date  . Acute pharyngitis 01/26/2009  . ALLERGIC RHINITIS 08/07/2007  . ARTHRITIS 08/07/2007  . BACK PAIN 09/15/2009  . BENIGN PROSTATIC HYPERTROPHY 05/20/2008  . EATING DISORDER, HX OF 08/07/2007  . ERECTILE DYSFUNCTION 02/18/2008  . HYPERLIPIDEMIA 08/07/2007  . HYPERTENSION 08/07/2007  . NECK PAIN, RIGHT 09/15/2009  . 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 SINUSITIS- ACUTE-NOS 04/30/2010   Past Surgical History  Procedure Laterality Date  . Gastric bypass  2006  . Lower back  2004  . Left foot surgery  2008  . Right knee arthroscopy  1994    reports that he has never smoked. He does not have any smokeless tobacco history on file. He reports that he does not drink alcohol or use illicit drugs. family history includes Heart disease in his father; Hyperlipidemia in his father; Hypertension in his father. Allergies  Allergen Reactions  . Ace Inhibitors     REACTION: tongue swelling   Current Outpatient Prescriptions on File Prior to Visit  Medication Sig Dispense Refill  . acetaminophen (TYLENOL ARTHRITIS PAIN) 650 MG CR tablet Take 500 mg by mouth every 8 (eight) hours as needed.     Marland Kitchen amLODipine (NORVASC) 10 MG tablet Take 1 tablet (10 mg total) by mouth daily. 90 tablet 3  . aspirin 81 MG tablet Take 81 mg by mouth daily.      Marland Kitchen desloratadine (CLARINEX) 5 MG tablet Take  1 tablet (5 mg total) by mouth daily. 90 tablet 3  . flintstones complete (FLINTSTONES) 60 MG chewable tablet Chew 1 tablet by mouth daily.      . hydrochlorothiazide (MICROZIDE) 12.5 MG capsule Take 1 capsule (12.5 mg total) by mouth daily. 90 capsule 3  . Omega-3 Krill Oil 500 MG CAPS Take 1 capsule by mouth daily.    . simvastatin (ZOCOR) 40 MG tablet Take 1 tablet (40 mg total) by mouth every evening. 90 tablet 3  . tadalafil (CIALIS) 20 MG tablet Take 1 tablet (20 mg total) by mouth daily as needed. For the medical condition:  Erectile dysfunction 10 tablet 11  . traMADol (ULTRAM) 50 MG tablet Take 1 tablet (50 mg total) by mouth every 12 (twelve) hours as needed for moderate pain. 30 tablet 0  . diclofenac sodium (VOLTAREN) 1 % GEL Apply 4 g topically 4 (four) times daily as needed. (Patient not taking: Reported on 01/09/2015) 100 g 11  . fluticasone (FLONASE) 50 MCG/ACT nasal spray Place 2 sprays into the nose daily. (Patient not taking: Reported on 01/09/2015) 16 g 2  . gabapentin (NEURONTIN) 100 MG capsule Take 1 capsule (100 mg total) by mouth 3 (three) times daily. (Patient not taking: Reported on 01/09/2015) 90 capsule 3  . zolpidem (AMBIEN CR) 12.5 MG CR tablet Take 1 tablet (12.5 mg total) by mouth at bedtime as needed. (Patient not taking: Reported on 01/09/2015) 90 tablet  1   No current facility-administered medications on file prior to visit.   Review of Systems  Constitutional: Negative for unusual diaphoresis or night sweats HENT: Negative for ringing in ear or discharge Eyes: Negative for double vision or worsening visual disturbance.  Respiratory: Negative for choking and stridor.   Gastrointestinal: Negative for vomiting or other signifcant bowel change Genitourinary: Negative for hematuria or change in urine volume.  Musculoskeletal: Negative for other MSK pain or swelling Skin: Negative for color change and worsening wound.  Neurological: Negative for tremors and numbness  other than noted  Psychiatric/Behavioral: Negative for decreased concentration or agitation other than above       Objective:   Physical Exam BP 124/82 mmHg  Pulse 81  Temp(Src) 98.2 F (36.8 C) (Oral)  Ht 6' (1.829 m)  Wt 258 lb (117.028 kg)  BMI 34.98 kg/m2  SpO2 97% VS noted, not ill apperaing Constitutional: Pt appears in no significant distress HENT: Head: NCAT.  Right Ear: External ear normal.  Left Ear: External ear normal.  Eyes: . Pupils are equal, round, and reactive to light. Conjunctivae and EOM are normal Neck: Normal range of motion. Neck supple.  Cardiovascular: Normal rate and regular rhythm.   Pulmonary/Chest: Effort normal and breath sounds without rales or wheezing.  Abd:  Soft, NT, ND, + BS Neurological: Pt is alert. Not confused , motor grossly intact Skin: Skin is warm. No rash, no LE edema Psychiatric: Pt behavior is normal. No agitation.   POCT urinalysis dipstick  Status: Finalresult Visible to patient:  Not Released Dx:  Hematuria           Ref Range 2:17 PM  34mo ago  103yr ago  70yr ago     Color, UA  amber       Clarity, UA  clear       Glucose, UA  neg       Bilirubin, UA  1+       Ketones, UA  neg       Spec Grav, UA  >=1.030       Blood, UA  neg       pH, UA  6.0       Protein, UA  15       Urobilinogen, UA  0.2 0.2 1.0 0.2    Nitrite, UA  neg       Leukocytes, UA Negative  Negative               Assessment & Plan:

## 2015-01-09 NOTE — Assessment & Plan Note (Signed)
For f/u psa today, o/w asympt,  to f/u any worsening symptoms or concerns

## 2015-01-09 NOTE — Addendum Note (Signed)
Addended by: Biagio Borg on: 01/09/2015 05:00 PM   Modules accepted: Miquel Dunn

## 2015-01-09 NOTE — Assessment & Plan Note (Signed)
stable overall by history and exam, recent data reviewed with pt, and pt to continue medical treatment as before,  to f/u any worsening symptoms or concerns BP Readings from Last 3 Encounters:  01/09/15 124/82  05/29/14 130/86  05/01/14 162/110

## 2015-01-09 NOTE — Addendum Note (Signed)
Addended by: Biagio Borg on: 01/09/2015 03:01 PM   Modules accepted: Orders, SmartSet

## 2015-01-09 NOTE — Assessment & Plan Note (Signed)
Also due for hep c screen

## 2015-01-10 LAB — HEPATITIS C ANTIBODY: HCV AB: NEGATIVE

## 2015-01-19 ENCOUNTER — Encounter: Payer: Self-pay | Admitting: Internal Medicine

## 2015-02-10 ENCOUNTER — Ambulatory Visit (HOSPITAL_COMMUNITY)
Admission: RE | Admit: 2015-02-10 | Discharge: 2015-02-10 | Disposition: A | Discharge status: Home / Self Care | Payer: BLUE CROSS/BLUE SHIELD | Source: Ambulatory Visit | Attending: Internal Medicine | Admitting: Internal Medicine

## 2015-02-10 DIAGNOSIS — R310 Gross hematuria: Secondary | ICD-10-CM | POA: Diagnosis present

## 2015-02-10 DIAGNOSIS — K7689 Other specified diseases of liver: Secondary | ICD-10-CM | POA: Diagnosis not present

## 2015-02-10 DIAGNOSIS — K5730 Diverticulosis of large intestine without perforation or abscess without bleeding: Secondary | ICD-10-CM | POA: Diagnosis not present

## 2015-04-03 ENCOUNTER — Encounter: Payer: Self-pay | Admitting: Internal Medicine

## 2015-04-03 ENCOUNTER — Other Ambulatory Visit: Payer: Self-pay | Admitting: Internal Medicine

## 2015-04-03 ENCOUNTER — Ambulatory Visit (INDEPENDENT_AMBULATORY_CARE_PROVIDER_SITE_OTHER): Payer: BLUE CROSS/BLUE SHIELD | Admitting: Internal Medicine

## 2015-04-03 ENCOUNTER — Ambulatory Visit (INDEPENDENT_AMBULATORY_CARE_PROVIDER_SITE_OTHER)
Admission: RE | Admit: 2015-04-03 | Discharge: 2015-04-03 | Disposition: A | Discharge status: Home / Self Care | Payer: BLUE CROSS/BLUE SHIELD | Source: Ambulatory Visit | Attending: Internal Medicine | Admitting: Internal Medicine

## 2015-04-03 VITALS — BP 140/76 | HR 72 | Temp 98.9°F | Resp 20 | Ht 71.0 in | Wt 256.0 lb

## 2015-04-03 DIAGNOSIS — M25562 Pain in left knee: Secondary | ICD-10-CM | POA: Diagnosis not present

## 2015-04-03 DIAGNOSIS — I10 Essential (primary) hypertension: Secondary | ICD-10-CM | POA: Diagnosis not present

## 2015-04-03 DIAGNOSIS — J019 Acute sinusitis, unspecified: Secondary | ICD-10-CM | POA: Diagnosis not present

## 2015-04-03 MED ORDER — PREDNISONE 10 MG PO TABS: ORAL_TABLET | ORAL | Status: DC

## 2015-04-03 MED ORDER — SILODOSIN 4 MG PO CAPS: 4.0000 mg | ORAL_CAPSULE | Freq: Every day | ORAL | Status: DC

## 2015-04-03 MED ORDER — TRAMADOL HCL 50 MG PO TABS: 50.0000 mg | ORAL_TABLET | Freq: Four times a day (QID) | ORAL | Status: DC | PRN

## 2015-04-03 MED ORDER — AZITHROMYCIN 250 MG PO TABS: ORAL_TABLET | ORAL | Status: DC

## 2015-04-03 NOTE — Progress Notes (Signed)
Pre visit review using our clinic review tool, if applicable. No additional management support is needed unless otherwise documented below in the visit note. 

## 2015-04-03 NOTE — Progress Notes (Signed)
Subjective:    Patient ID: Joshua Meek Sr., male    DOB: 12/20/1950, 65 y.o.   MRN: [id number]  HPI  Here with acute onset left knee pain and swelling for over 1 wk, sharp, mod to severe, worst pain to post knee,post distal thigh and post proximal leg, overall worse with sitting for a while then trying to stand, does actually get better after the first 15-20 steps with walking, no fever, trauma, hx of gout or pseudogout, and alleve this am helped only minor.Pt denies chest pain, increased sob or doe, wheezing, orthopnea, PND, increased LE swelling, palpitations, dizziness or syncope.  Pt denies new neurological symptoms such as new headache, or facial or extremity weakness or numbness   Pt denies polydipsia, polyuria. Also incidentally  Here with 2-3 days acute onset fever, facial pain, pressure, headache, general weakness and malaise, and greenish d/c, with mild ST and cough  Past Medical History  Diagnosis Date  . Acute pharyngitis 01/26/2009  . ALLERGIC RHINITIS 08/07/2007  . ARTHRITIS 08/07/2007  . BACK PAIN 09/15/2009  . BENIGN PROSTATIC HYPERTROPHY 05/20/2008  . EATING DISORDER, HX OF 08/07/2007  . ERECTILE DYSFUNCTION 02/18/2008  . HYPERLIPIDEMIA 08/07/2007  . HYPERTENSION 08/07/2007  . NECK PAIN, RIGHT 09/15/2009  . 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 SINUSITIS- ACUTE-NOS 04/30/2010   Past Surgical History  Procedure Laterality Date  . Gastric bypass  2006  . Lower back  2004  . Left foot surgery  2008  . Right knee arthroscopy  1994    reports that he has never smoked. He does not have any smokeless tobacco history on file. He reports that he does not drink alcohol or use illicit drugs. family history includes Heart disease in his father; Hyperlipidemia in his father; Hypertension in his father. Allergies  Allergen Reactions  . Ace Inhibitors     REACTION: tongue swelling   Current Outpatient Prescriptions on File Prior to Visit  Medication Sig Dispense Refill  .  acetaminophen (TYLENOL ARTHRITIS PAIN) 650 MG CR tablet Take 500 mg by mouth every 8 (eight) hours as needed.     Marland Kitchen amLODipine (NORVASC) 10 MG tablet Take 1 tablet (10 mg total) by mouth daily. 90 tablet 3  . aspirin 81 MG tablet Take 81 mg by mouth daily.      Marland Kitchen desloratadine (CLARINEX) 5 MG tablet Take 1 tablet (5 mg total) by mouth daily. 90 tablet 3  . diclofenac sodium (VOLTAREN) 1 % GEL Apply 4 g topically 4 (four) times daily as needed. 100 g 11  . flintstones complete (FLINTSTONES) 60 MG chewable tablet Chew 1 tablet by mouth daily.      . fluticasone (FLONASE) 50 MCG/ACT nasal spray Place 2 sprays into the nose daily. 16 g 2  . gabapentin (NEURONTIN) 100 MG capsule Take 1 capsule (100 mg total) by mouth 3 (three) times daily. 90 capsule 3  . hydrochlorothiazide (MICROZIDE) 12.5 MG capsule Take 1 capsule (12.5 mg total) by mouth daily. 90 capsule 3  . Omega-3 Krill Oil 500 MG CAPS Take 1 capsule by mouth daily.    . simvastatin (ZOCOR) 40 MG tablet Take 1 tablet (40 mg total) by mouth every evening. 90 tablet 3  . tadalafil (CIALIS) 20 MG tablet Take 1 tablet (20 mg total) by mouth daily as needed. For the medical condition:  Erectile dysfunction 10 tablet 11  . zolpidem (AMBIEN CR) 12.5 MG CR tablet Take 1 tablet (12.5 mg total) by mouth at bedtime as needed.  90 tablet 1   No current facility-administered medications on file prior to visit.   Review of Systems  Constitutional: Negative for unusual diaphoresis or night sweats HENT: Negative for ringing in ear or discharge Eyes: Negative for double vision or worsening visual disturbance.  Respiratory: Negative for choking and stridor.   Gastrointestinal: Negative for vomiting or other signifcant bowel change Genitourinary: Negative for hematuria or change in urine volume.  Musculoskeletal: Negative for other MSK pain or swelling Skin: Negative for color change and worsening wound.  Neurological: Negative for tremors and numbness other  than noted  Psychiatric/Behavioral: Negative for decreased concentration or agitation other than above       Objective:   Physical Exam BP 140/76 mmHg  Pulse 72  Temp(Src) 98.9 F (37.2 C) (Oral)  Resp 20  Ht 5\' 11"  (1.803 m)  Wt 256 lb (116.121 kg)  BMI 35.72 kg/m2  SpO2 94% VS noted,  Constitutional: Pt appears in no significant distress HENT: Head: NCAT.  Right Ear: External ear normal.  Left Ear: External ear normal.  Bilat tm's with mild erythema.  Max sinus areas mild tender.  Pharynx with mild erythema, no exudate Eyes: . Pupils are equal, round, and reactive to light. Conjunctivae and EOM are normal Neck: Normal range of motion. Neck supple.  Cardiovascular: Normal rate and regular rhythm.   Pulmonary/Chest: Effort normal and breath sounds without rales or wheezing.  Neurological: Pt is alert. Not confused , motor grossly intact Skin: Skin is warm. No rash, no LE edema Psychiatric: Pt behavior is normal. No agitation.  Left knee with 1-2+ effusion, mild diffuse tender, FROM, no laxity    Assessment & Plan:

## 2015-04-03 NOTE — Assessment & Plan Note (Signed)
Mild to mod, for antibx course,  to f/u any worsening symptoms or concerns 

## 2015-04-03 NOTE — Patient Instructions (Signed)
Please take all new medication as prescribed - the antibiotic  You can also take Delsym OTC for cough, and/or Mucinex (or it's generic off brand) for congestion, and tylenol as needed for pain.  Please take all new medication as prescribed  - the increased tramadol to every 6 hrs as needed for pain  You will be contacted regarding the referral for: Orthopedic as discussed  Please go to the XRAY Department in the Basement (go straight as you get off the elevator) for the x-ray testing  You will be contacted by phone if any changes need to be made immediately.  Otherwise, you will receive a letter about your results with an explanation, but please check with MyChart first.  Please remember to sign up for MyChart if you have not done so, as this will be important to you in the future with finding out test results, communicating by private email, and scheduling acute appointments online when needed.

## 2015-04-03 NOTE — Assessment & Plan Note (Signed)
stable overall by history and exam, recent data reviewed with pt, and pt to continue medical treatment as before,  to f/u any worsening symptoms or concerns BP Readings from Last 3 Encounters:  04/03/15 140/76  01/09/15 124/82  05/29/14 130/86

## 2015-04-03 NOTE — Assessment & Plan Note (Addendum)
Mod to severe, for pain control, film to assess for pseudogout, consider prednisone, refer sport med

## 2015-04-06 MED ORDER — AZITHROMYCIN 250 MG PO TABS: ORAL_TABLET | ORAL | Status: DC

## 2015-05-10 ENCOUNTER — Other Ambulatory Visit: Payer: Self-pay | Admitting: Internal Medicine

## 2015-07-10 ENCOUNTER — Ambulatory Visit: Payer: BLUE CROSS/BLUE SHIELD | Admitting: Internal Medicine

## 2015-07-16 ENCOUNTER — Ambulatory Visit (INDEPENDENT_AMBULATORY_CARE_PROVIDER_SITE_OTHER): Payer: BLUE CROSS/BLUE SHIELD | Admitting: Internal Medicine

## 2015-07-16 ENCOUNTER — Other Ambulatory Visit (INDEPENDENT_AMBULATORY_CARE_PROVIDER_SITE_OTHER): Payer: BLUE CROSS/BLUE SHIELD

## 2015-07-16 ENCOUNTER — Encounter: Payer: Self-pay | Admitting: Internal Medicine

## 2015-07-16 VITALS — BP 140/86 | HR 91 | Temp 98.5°F | Resp 20 | Wt 270.0 lb

## 2015-07-16 DIAGNOSIS — Z0001 Encounter for general adult medical examination with abnormal findings: Secondary | ICD-10-CM

## 2015-07-16 DIAGNOSIS — I10 Essential (primary) hypertension: Secondary | ICD-10-CM

## 2015-07-16 DIAGNOSIS — M25562 Pain in left knee: Secondary | ICD-10-CM

## 2015-07-16 DIAGNOSIS — R252 Cramp and spasm: Secondary | ICD-10-CM

## 2015-07-16 DIAGNOSIS — R6889 Other general symptoms and signs: Secondary | ICD-10-CM

## 2015-07-16 LAB — CBC WITH DIFFERENTIAL/PLATELET
BASOS ABS: 0 10*3/uL (ref 0.0–0.1)
Basophils Relative: 0.2 % (ref 0.0–3.0)
EOS ABS: 0.1 10*3/uL (ref 0.0–0.7)
Eosinophils Relative: 1.4 % (ref 0.0–5.0)
HCT: 40.4 % (ref 39.0–52.0)
HEMOGLOBIN: 13.7 g/dL (ref 13.0–17.0)
Lymphocytes Relative: 16.6 % (ref 12.0–46.0)
Lymphs Abs: 1.1 10*3/uL (ref 0.7–4.0)
MCHC: 33.8 g/dL (ref 30.0–36.0)
MCV: 86.1 fl (ref 78.0–100.0)
MONO ABS: 0.6 10*3/uL (ref 0.1–1.0)
Monocytes Relative: 9.5 % (ref 3.0–12.0)
Neutro Abs: 4.9 10*3/uL (ref 1.4–7.7)
Neutrophils Relative %: 72.3 % (ref 43.0–77.0)
Platelets: 200 10*3/uL (ref 150.0–400.0)
RBC: 4.69 Mil/uL (ref 4.22–5.81)
RDW: 13.9 % (ref 11.5–15.5)
WBC: 6.7 10*3/uL (ref 4.0–10.5)

## 2015-07-16 LAB — URINALYSIS, ROUTINE W REFLEX MICROSCOPIC
Bilirubin Urine: NEGATIVE
Hgb urine dipstick: NEGATIVE
Ketones, ur: NEGATIVE
Leukocytes, UA: NEGATIVE
NITRITE: NEGATIVE
SPECIFIC GRAVITY, URINE: 1.02 (ref 1.000–1.030)
Total Protein, Urine: NEGATIVE
UROBILINOGEN UA: 0.2 (ref 0.0–1.0)
Urine Glucose: 100 — AB
pH: 5.5 (ref 5.0–8.0)

## 2015-07-16 LAB — HEPATIC FUNCTION PANEL
ALBUMIN: 3.9 g/dL (ref 3.5–5.2)
ALK PHOS: 51 U/L (ref 39–117)
ALT: 14 U/L (ref 0–53)
AST: 17 U/L (ref 0–37)
BILIRUBIN DIRECT: 0.1 mg/dL (ref 0.0–0.3)
TOTAL PROTEIN: 6.3 g/dL (ref 6.0–8.3)
Total Bilirubin: 0.4 mg/dL (ref 0.2–1.2)

## 2015-07-16 LAB — LIPID PANEL
CHOLESTEROL: 161 mg/dL (ref 0–200)
HDL: 52.5 mg/dL (ref >39.00)
LDL Cholesterol: 100 mg/dL — ABNORMAL HIGH (ref 0–99)
NonHDL: 108.27
TRIGLYCERIDES: 42 mg/dL (ref 0.0–149.0)
Total CHOL/HDL Ratio: 3
VLDL: 8.4 mg/dL (ref 0.0–40.0)

## 2015-07-16 LAB — BASIC METABOLIC PANEL
BUN: 21 mg/dL (ref 6–23)
CALCIUM: 9.2 mg/dL (ref 8.4–10.5)
CO2: 30 mEq/L (ref 19–32)
CREATININE: 0.84 mg/dL (ref 0.40–1.50)
Chloride: 103 mEq/L (ref 96–112)
GFR: 118.12 mL/min (ref >60.00)
Glucose, Bld: 73 mg/dL (ref 70–99)
Potassium: 3.7 mEq/L (ref 3.5–5.1)
Sodium: 141 mEq/L (ref 135–145)

## 2015-07-16 LAB — MAGNESIUM: MAGNESIUM: 1.9 mg/dL (ref 1.5–2.5)

## 2015-07-16 LAB — TSH: TSH: 0.83 u[IU]/mL (ref 0.35–4.50)

## 2015-07-16 LAB — PSA: PSA: 0.65 ng/mL (ref 0.10–4.00)

## 2015-07-16 MED ORDER — CYCLOBENZAPRINE HCL 5 MG PO TABS: 5.0000 mg | ORAL_TABLET | Freq: Three times a day (TID) | ORAL | Status: DC | PRN

## 2015-07-16 MED ORDER — PHENTERMINE HCL 37.5 MG PO CAPS: 37.5000 mg | ORAL_CAPSULE | ORAL | Status: DC

## 2015-07-16 NOTE — Assessment & Plan Note (Signed)
stable overall by history and exam, recent data reviewed with pt, and pt to continue medical treatment as before,  to f/u any worsening symptoms or concerns BP Readings from Last 3 Encounters:  07/16/15 140/86  04/03/15 140/76  01/09/15 124/82

## 2015-07-16 NOTE — Assessment & Plan Note (Signed)
Improved, no further tx needed at this time

## 2015-07-16 NOTE — Progress Notes (Signed)
Subjective:    Patient ID: Joshua Meek Sr., male    DOB: 1950-07-02, 65 y.o.   MRN: [id number]  HPI  Here for wellness and f/u;  Overall doing ok;  Pt denies Chest pain, worsening SOB, DOE, wheezing, orthopnea, PND, worsening LE edema, palpitations, dizziness or syncope.  Pt denies neurological change such as new headache, facial or extremity weakness.  Pt denies polydipsia, polyuria, or low sugar symptoms. Pt states overall good compliance with treatment and medications, good tolerability, and has been trying to follow appropriate diet.  Pt denies worsening depressive symptoms, suicidal ideation or panic. No fever, night sweats, wt loss, loss of appetite, or other constitutional symptoms.  Pt states good ability with ADL's, has low fall risk, home safety reviewed and adequate, no other significant changes in hearing or vision, and only occasionally active with exercise.  No further gross hematuria with less straining urination;  Did have urinary freq and started flomax with good results for BPH, takes at night. Did get PT for left knee since last seen, seemed to help, pain med helped, had cortisone per ortho, now much better with walking and standing, pain has not recurred, does not need to return to ortho fo rnow.  Does have recurring feet and leg cramps, after doing quite a bit fo walking and standing, weight is also back up, seems to eat quite a bit during stressful work days, just not as much on the weekends.  Is s/p gastric bypass, had a low wt of 191, now returned to higher.   Wt Readings from Last 3 Encounters:  07/16/15 270 lb (122.471 kg)  04/03/15 256 lb (116.121 kg)  01/09/15 258 lb (117.028 kg)   Past Medical History  Diagnosis Date  . Acute pharyngitis 01/26/2009  . ALLERGIC RHINITIS 08/07/2007  . ARTHRITIS 08/07/2007  . BACK PAIN 09/15/2009  . BENIGN PROSTATIC HYPERTROPHY 05/20/2008  . EATING DISORDER, HX OF 08/07/2007  . ERECTILE DYSFUNCTION 02/18/2008  . HYPERLIPIDEMIA 08/07/2007  .  HYPERTENSION 08/07/2007  . NECK PAIN, RIGHT 09/15/2009  . 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 SINUSITIS- ACUTE-NOS 04/30/2010   Past Surgical History  Procedure Laterality Date  . Gastric bypass  2006  . Lower back  2004  . Left foot surgery  2008  . Right knee arthroscopy  1994    reports that he has never smoked. He does not have any smokeless tobacco history on file. He reports that he does not drink alcohol or use illicit drugs. family history includes Heart disease in his father; Hyperlipidemia in his father; Hypertension in his father. Allergies  Allergen Reactions  . Ace Inhibitors     REACTION: tongue swelling   Current Outpatient Prescriptions on File Prior to Visit  Medication Sig Dispense Refill  . acetaminophen (TYLENOL ARTHRITIS PAIN) 650 MG CR tablet Take 500 mg by mouth every 8 (eight) hours as needed.     Marland Kitchen amLODipine (NORVASC) 10 MG tablet TAKE 1 TABLET BY MOUTH EVERY DAY 90 tablet 3  . aspirin 81 MG tablet Take 81 mg by mouth daily.      Marland Kitchen azithromycin (ZITHROMAX Z-PAK) 250 MG tablet Use as directed 6 tablet 1  . desloratadine (CLARINEX) 5 MG tablet TAKE 1 TABLET DAILY 90 tablet 3  . diclofenac sodium (VOLTAREN) 1 % GEL Apply 4 g topically 4 (four) times daily as needed. 100 g 11  . flintstones complete (FLINTSTONES) 60 MG chewable tablet Chew 1 tablet by mouth daily.      Marland Kitchen  fluticasone (FLONASE) 50 MCG/ACT nasal spray Place 2 sprays into the nose daily. 16 g 2  . gabapentin (NEURONTIN) 100 MG capsule Take 1 capsule (100 mg total) by mouth 3 (three) times daily. 90 capsule 3  . hydrochlorothiazide (MICROZIDE) 12.5 MG capsule TAKE ONE CAPSULE BY MOUTH EVERY DAY 90 capsule 3  . Omega-3 Krill Oil 500 MG CAPS Take 1 capsule by mouth daily.    . predniSONE (DELTASONE) 10 MG tablet 3 tabs by mouth per day for 3 days,2tabs per day for 3 days,1tab per day for 3 days 18 tablet 0  . silodosin (RAPAFLO) 4 MG CAPS capsule Take 1 capsule (4 mg total) by mouth daily with  breakfast. 30 capsule 11  . simvastatin (ZOCOR) 40 MG tablet Take 1 tablet (40 mg total) by mouth every evening. 90 tablet 3  . tadalafil (CIALIS) 20 MG tablet Take 1 tablet (20 mg total) by mouth daily as needed. For the medical condition:  Erectile dysfunction 10 tablet 11  . traMADol (ULTRAM) 50 MG tablet Take 1 tablet (50 mg total) by mouth every 6 (six) hours as needed for moderate pain. 120 tablet 2  . zolpidem (AMBIEN CR) 12.5 MG CR tablet Take 1 tablet (12.5 mg total) by mouth at bedtime as needed. 90 tablet 1   No current facility-administered medications on file prior to visit.   Review of Systems Constitutional: Negative for increased diaphoresis, or other activity, appetite or siginficant weight change other than noted HENT: Negative for worsening hearing loss, ear pain, facial swelling, mouth sores and neck stiffness.   Eyes: Negative for other worsening pain, redness or visual disturbance.  Respiratory: Negative for choking or stridor Cardiovascular: Negative for other chest pain and palpitations.  Gastrointestinal: Negative for worsening diarrhea, blood in stool, or abdominal distention Genitourinary: Negative for hematuria, flank pain or change in urine volume.  Musculoskeletal: Negative for myalgias or other joint complaints.  Skin: Negative for other color change and wound or drainage.  Neurological: Negative for syncope and numbness. other than noted Hematological: Negative for adenopathy. or other swelling Psychiatric/Behavioral: Negative for hallucinations, SI, self-injury, decreased concentration or other worsening agitation.      Objective:   Physical Exam BP 140/86 mmHg  Pulse 91  Temp(Src) 98.5 F (36.9 C) (Oral)  Resp 20  Wt 270 lb (122.471 kg)  SpO2 96% VS noted, morbid obese Constitutional: Pt is oriented to person, place, and time. Appears well-developed and well-nourished, in no significant distress Head: Normocephalic and atraumatic  Eyes:  Conjunctivae and EOM are normal. Pupils are equal, round, and reactive to light Right Ear: External ear normal.  Left Ear: External ear normal Nose: Nose normal.  Mouth/Throat: Oropharynx is clear and moist  Neck: Normal range of motion. Neck supple. No JVD present. No tracheal deviation present or significant neck LA or mass Cardiovascular: Normal rate, regular rhythm, normal heart sounds and intact distal pulses.   Pulmonary/Chest: Effort normal and breath sounds without rales or wheezing  Abdominal: Soft. Bowel sounds are normal. NT. No HSM  Musculoskeletal: Normal range of motion. Exhibits no edema Lymphadenopathy: Has no cervical adenopathy.  Neurological: Pt is alert and oriented to person, place, and time. Pt has normal reflexes. No cranial nerve deficit. Motor grossly intact Skin: Skin is warm and dry. No rash noted or new ulcers Psychiatric:  Has normal mood and affect. Behavior is normal.     Assessment & Plan:

## 2015-07-16 NOTE — Assessment & Plan Note (Signed)
S/p gastric bypass, has regained significant wt, for phentermine 37.5 asd,  to f/u any worsening symptoms or concerns

## 2015-07-16 NOTE — Assessment & Plan Note (Addendum)
Etiology unclear, for muscle relaxer prn, check Mg with labs,  to f/u any worsening symptoms or concerns  In addition to the time spent performing CPE, I spent an additional 40 minutes face to face,in which greater than 50% of this time was spent in counseling and coordination of care for patient's acute illness as documented.

## 2015-07-16 NOTE — Addendum Note (Signed)
Addended by: Biagio Borg on: 07/16/2015 03:58 PM   Modules accepted: Orders

## 2015-07-16 NOTE — Assessment & Plan Note (Signed)

## 2015-07-16 NOTE — Patient Instructions (Signed)
Please take all new medication as prescribed - the phentermine, and the flexeril as needed  Please continue all other medications as before, and refills have been done if requested.  Please have the pharmacy call with any other refills you may need.  Please continue your efforts at being more active, low cholesterol diet, and weight control.  You are otherwise up to date with prevention measures today.  Please keep your appointments with your specialists as you may have planned  Please go to the LAB in the Basement (turn left off the elevator) for the tests to be done today  You will be contacted by phone if any changes need to be made immediately.  Otherwise, you will receive a letter about your results with an explanation, but please check with MyChart first.  Please remember to sign up for MyChart if you have not done so, as this will be important to you in the future with finding out test results, communicating by private email, and scheduling acute appointments online when needed.  Please return in 6 months, or sooner if needed

## 2015-07-16 NOTE — Progress Notes (Signed)
Pre visit review using our clinic review tool, if applicable. No additional management support is needed unless otherwise documented below in the visit note. 

## 2015-07-28 ENCOUNTER — Other Ambulatory Visit: Payer: Self-pay | Admitting: Internal Medicine

## 2015-09-11 ENCOUNTER — Other Ambulatory Visit: Payer: Self-pay | Admitting: Internal Medicine

## 2015-09-14 NOTE — Telephone Encounter (Signed)
Last refill was 07/16/15. Please advise

## 2015-11-22 ENCOUNTER — Other Ambulatory Visit: Payer: Self-pay | Admitting: Internal Medicine

## 2015-11-24 NOTE — Telephone Encounter (Signed)
Done hardcopy to Corinne  

## 2015-11-24 NOTE — Telephone Encounter (Signed)
Faxed

## 2015-11-26 ENCOUNTER — Other Ambulatory Visit: Payer: Self-pay | Admitting: Internal Medicine

## 2016-01-26 ENCOUNTER — Encounter: Payer: Self-pay | Admitting: Internal Medicine

## 2016-01-26 ENCOUNTER — Ambulatory Visit (INDEPENDENT_AMBULATORY_CARE_PROVIDER_SITE_OTHER): Payer: BLUE CROSS/BLUE SHIELD | Admitting: Internal Medicine

## 2016-01-26 VITALS — BP 140/78 | HR 83 | Temp 98.5°F | Resp 20 | Wt 266.0 lb

## 2016-01-26 DIAGNOSIS — J309 Allergic rhinitis, unspecified: Secondary | ICD-10-CM | POA: Diagnosis not present

## 2016-01-26 DIAGNOSIS — H1013 Acute atopic conjunctivitis, bilateral: Secondary | ICD-10-CM

## 2016-01-26 DIAGNOSIS — Z0001 Encounter for general adult medical examination with abnormal findings: Secondary | ICD-10-CM | POA: Diagnosis not present

## 2016-01-26 DIAGNOSIS — Z23 Encounter for immunization: Secondary | ICD-10-CM

## 2016-01-26 DIAGNOSIS — I10 Essential (primary) hypertension: Secondary | ICD-10-CM

## 2016-01-26 MED ORDER — TRIAMCINOLONE ACETONIDE 55 MCG/ACT NA AERO: 2.0000 | INHALATION_SPRAY | Freq: Every day | NASAL | 12 refills | Status: DC

## 2016-01-26 MED ORDER — VALSARTAN 160 MG PO TABS: 160.0000 mg | ORAL_TABLET | Freq: Every day | ORAL | 3 refills | Status: DC

## 2016-01-26 MED ORDER — AMLODIPINE BESYLATE 5 MG PO TABS: 5.0000 mg | ORAL_TABLET | Freq: Every day | ORAL | 3 refills | Status: DC

## 2016-01-26 MED ORDER — AZELASTINE HCL 0.05 % OP SOLN: 1.0000 [drp] | Freq: Two times a day (BID) | OPHTHALMIC | 12 refills | Status: DC | PRN

## 2016-01-26 NOTE — Progress Notes (Signed)
Pre visit review using our clinic review tool, if applicable. No additional management support is needed unless otherwise documented below in the visit note. 

## 2016-01-26 NOTE — Progress Notes (Signed)
Subjective:    Patient ID: Joshua Meek Sr., male    DOB: 1951/02/12, 65 y.o.   MRN: [id number]  HPI  Here to f/u; overall doing ok,  Pt denies chest pain, increasing sob or doe, wheezing, orthopnea, PND,  palpitations, dizziness or syncope, though does have intermittent LE edema that comes and goes daily, better in the am, back in the PM, without pain or erythema.  Pt denies new neurological symptoms such as new headache, or facial or extremity weakness or numbness.  Pt denies polydipsia, polyuria, or low sugar episode  Wondering about fluid pills causing severe leg cramps, but seem better in the past wk.  rapaflo caused much less urinary urgency and freq, so wants to continue. Does have several wks ongoing nasal allergy symptoms with clearish congestion, itch and sneezing, without fever, pain, ST, cough, swelling or wheezing.  Has some stress eating symptoms with work stress, but plans to retire in 11 months. Peak wt has been 380, overall down several lbs since last.  Wt Readings from Last 3 Encounters:  01/26/16 266 lb (120.7 kg)  07/16/15 270 lb (122.5 kg)  04/03/15 256 lb (116.1 kg)  No other new history findings Past Medical History:  Diagnosis Date  . Acute pharyngitis 01/26/2009  . ALLERGIC RHINITIS 08/07/2007  . ARTHRITIS 08/07/2007  . BACK PAIN 09/15/2009  . BENIGN PROSTATIC HYPERTROPHY 05/20/2008  . EATING DISORDER, HX OF 08/07/2007  . ERECTILE DYSFUNCTION 02/18/2008  . HYPERLIPIDEMIA 08/07/2007  . HYPERTENSION 08/07/2007  . NECK PAIN, RIGHT 09/15/2009  . 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 SINUSITIS- ACUTE-NOS 04/30/2010   Past Surgical History:  Procedure Laterality Date  . GASTRIC BYPASS  2006  . Left foot surgery  2008  . Lower back  2004  . Right knee arthroscopy  1994    reports that he has never smoked. He does not have any smokeless tobacco history on file. He reports that he does not drink alcohol or use drugs. family history includes Heart disease in his father;  Hyperlipidemia in his father; Hypertension in his father. Allergies  Allergen Reactions  . Ace Inhibitors     REACTION: tongue swelling   Current Outpatient Prescriptions on File Prior to Visit  Medication Sig Dispense Refill  . acetaminophen (TYLENOL ARTHRITIS PAIN) 650 MG CR tablet Take 500 mg by mouth every 8 (eight) hours as needed.     Marland Kitchen aspirin 81 MG tablet Take 81 mg by mouth daily.      Marland Kitchen CIALIS 20 MG tablet TAKE 1 TABLET BY MOUTH DAILY AS NEEDED. 10 tablet 4  . cyclobenzaprine (FLEXERIL) 5 MG tablet TAKE 1 TABLET (5 MG TOTAL) BY MOUTH 3 (THREE) TIMES DAILY AS NEEDED FOR MUSCLE SPASMS. 60 tablet 1  . desloratadine (CLARINEX) 5 MG tablet TAKE 1 TABLET DAILY 90 tablet 3  . diclofenac sodium (VOLTAREN) 1 % GEL Apply 4 g topically 4 (four) times daily as needed. 100 g 11  . flintstones complete (FLINTSTONES) 60 MG chewable tablet Chew 1 tablet by mouth daily.      Marland Kitchen gabapentin (NEURONTIN) 100 MG capsule Take 1 capsule (100 mg total) by mouth 3 (three) times daily. 90 capsule 3  . Omega-3 Krill Oil 500 MG CAPS Take 1 capsule by mouth daily.    . phentermine 37.5 MG capsule Take 1 capsule (37.5 mg total) by mouth every morning. 30 capsule 2  . predniSONE (DELTASONE) 10 MG tablet 3 tabs by mouth per day for 3 days,2tabs per day for  3 days,1tab per day for 3 days 18 tablet 0  . silodosin (RAPAFLO) 4 MG CAPS capsule Take 1 capsule (4 mg total) by mouth daily with breakfast. 30 capsule 11  . simvastatin (ZOCOR) 40 MG tablet TAKE 1 TABLET EVERY EVENING 90 tablet 3  . traMADol (ULTRAM) 50 MG tablet TAKE 1 TABLET BY MOUTH EVERY 6 HOURS AS NEEDED FOR MODERATE PAIN 120 tablet 2  . zolpidem (AMBIEN CR) 12.5 MG CR tablet Take 1 tablet (12.5 mg total) by mouth at bedtime as needed. 90 tablet 1   No current facility-administered medications on file prior to visit.    Review of Systems  Constitutional: Negative for unusual diaphoresis or night sweats HENT: Negative for ear swelling or  discharge Eyes: Negative for worsening visual haziness  Respiratory: Negative for choking and stridor.   Gastrointestinal: Negative for distension or worsening eructation Genitourinary: Negative for retention or change in urine volume.  Musculoskeletal: Negative for other MSK pain or swelling Skin: Negative for color change and worsening wound Neurological: Negative for tremors and numbness other than noted  Psychiatric/Behavioral: Negative for decreased concentration or agitation other than above       Objective:   Physical Exam BP 140/78   Pulse 83   Temp 98.5 F (36.9 C) (Oral)   Resp 20   Wt 266 lb (120.7 kg)   SpO2 95%   BMI 37.10 kg/m  VS noted,  Not ill appearing Constitutional: Pt appears in no apparent distress HENT: Head: NCAT.  Right Ear: External ear normal.  Left Ear: External ear normal.  Eyes: . Pupils are equal, round, and reactive to light. Conjunctivae with bilat weepiness and clearish d/c, and EOM are normal; eyelids upper and lower bilat somewhat puffy swelling Bilat tm's with mild erythema.  Max sinus areas non tender.  Pharynx with mild erythema, no exudate Neck: Normal range of motion. Neck supple.  Cardiovascular: Normal rate and regular rhythm.   Pulmonary/Chest: Effort normal and breath sounds without rales or wheezing.  Neurological: Pt is alert. Not confused , motor grossly intact Skin: Skin is warm. No rash, trace bilat LE edema Psychiatric: Pt behavior is normal. No agitation.     Assessment & Plan:

## 2016-01-26 NOTE — Patient Instructions (Addendum)
You had the flu shot today, and the steroid shot  OK to stop the HCt 12.5 mg and the amlodipine 10 mg as you have  Please take all new medication as prescribed - the diovan 160 mg per day, and amlodipine 5 mg per day, as well as the nasacort instead of the flonase, And the Optivar for the eyes if needed  Please continue all other medications as before, including the clarinex  Please have the pharmacy call with any other refills you may need.  Please continue your efforts at being more active, low cholesterol diet, and weight control.  You are otherwise up to date with prevention measures today.  Please keep your appointments with your specialists as you may have planned  Please return in 6 months, or sooner if needed, with Lab testing done 3-5 days before

## 2016-01-30 DIAGNOSIS — H1010 Acute atopic conjunctivitis, unspecified eye: Secondary | ICD-10-CM | POA: Insufficient documentation

## 2016-01-30 NOTE — Assessment & Plan Note (Addendum)
Mild to mod, for depomedrol IM 80, clarinex to cont, add nasacort asd,  to f/u any worsening symptoms or concerns

## 2016-01-30 NOTE — Assessment & Plan Note (Signed)
Mild to mod, for optivar asd,  to f/u any worsening symptoms or concerns 

## 2016-01-30 NOTE — Assessment & Plan Note (Signed)
With some LE edema possibly worsened by amlodipine 10; ok to decrease amlodipine to 5 qd, and add diovan 160 mg,  to f/u any worsening symptoms or concerns

## 2016-05-07 ENCOUNTER — Other Ambulatory Visit: Payer: Self-pay | Admitting: Internal Medicine

## 2016-06-23 ENCOUNTER — Other Ambulatory Visit: Payer: Self-pay | Admitting: Internal Medicine

## 2016-06-24 NOTE — Telephone Encounter (Signed)
Faxed

## 2016-06-24 NOTE — Telephone Encounter (Signed)
Done hardcopy to Shirron  

## 2016-06-29 ENCOUNTER — Other Ambulatory Visit: Payer: Self-pay | Admitting: Internal Medicine

## 2016-06-29 MED ORDER — SILDENAFIL CITRATE 100 MG PO TABS: 50.0000 mg | ORAL_TABLET | Freq: Every day | ORAL | 11 refills | Status: DC | PRN

## 2016-06-29 NOTE — Telephone Encounter (Signed)
Ok to let pt know I changed his cialis to viagra to [facility], as we received a note from the pharmacy that cialis needed to be changed due to cost

## 2016-06-29 NOTE — Telephone Encounter (Signed)
Called pt, LVM.   

## 2016-07-25 ENCOUNTER — Other Ambulatory Visit: Payer: Self-pay | Admitting: Internal Medicine

## 2016-08-05 ENCOUNTER — Other Ambulatory Visit: Payer: Self-pay | Admitting: Internal Medicine

## 2016-08-30 ENCOUNTER — Other Ambulatory Visit (INDEPENDENT_AMBULATORY_CARE_PROVIDER_SITE_OTHER): Payer: Managed Care, Other (non HMO)

## 2016-08-30 ENCOUNTER — Encounter: Payer: Self-pay | Admitting: Internal Medicine

## 2016-08-30 ENCOUNTER — Ambulatory Visit (INDEPENDENT_AMBULATORY_CARE_PROVIDER_SITE_OTHER): Payer: Managed Care, Other (non HMO) | Admitting: Internal Medicine

## 2016-08-30 VITALS — BP 128/84 | HR 77 | Ht 71.0 in | Wt 281.0 lb

## 2016-08-30 DIAGNOSIS — G8929 Other chronic pain: Secondary | ICD-10-CM | POA: Diagnosis not present

## 2016-08-30 DIAGNOSIS — Z23 Encounter for immunization: Secondary | ICD-10-CM

## 2016-08-30 DIAGNOSIS — I872 Venous insufficiency (chronic) (peripheral): Secondary | ICD-10-CM | POA: Insufficient documentation

## 2016-08-30 DIAGNOSIS — R609 Edema, unspecified: Secondary | ICD-10-CM

## 2016-08-30 DIAGNOSIS — R81 Glycosuria: Secondary | ICD-10-CM

## 2016-08-30 DIAGNOSIS — M25562 Pain in left knee: Secondary | ICD-10-CM

## 2016-08-30 DIAGNOSIS — Z0001 Encounter for general adult medical examination with abnormal findings: Secondary | ICD-10-CM

## 2016-08-30 DIAGNOSIS — Z114 Encounter for screening for human immunodeficiency virus [HIV]: Secondary | ICD-10-CM

## 2016-08-30 DIAGNOSIS — R600 Localized edema: Secondary | ICD-10-CM

## 2016-08-30 DIAGNOSIS — I10 Essential (primary) hypertension: Secondary | ICD-10-CM

## 2016-08-30 DIAGNOSIS — I5032 Chronic diastolic (congestive) heart failure: Secondary | ICD-10-CM | POA: Insufficient documentation

## 2016-08-30 LAB — CBC WITH DIFFERENTIAL/PLATELET
BASOS ABS: 0.1 10*3/uL (ref 0.0–0.1)
Basophils Relative: 1 % (ref 0.0–3.0)
EOS PCT: 2.3 % (ref 0.0–5.0)
Eosinophils Absolute: 0.1 10*3/uL (ref 0.0–0.7)
HCT: 41.7 % (ref 39.0–52.0)
Hemoglobin: 13.6 g/dL (ref 13.0–17.0)
LYMPHS ABS: 1.2 10*3/uL (ref 0.7–4.0)
Lymphocytes Relative: 19 % (ref 12.0–46.0)
MCHC: 32.6 g/dL (ref 30.0–36.0)
MCV: 85.2 fl (ref 78.0–100.0)
MONO ABS: 0.7 10*3/uL (ref 0.1–1.0)
Monocytes Relative: 11 % (ref 3.0–12.0)
NEUTROS PCT: 66.7 % (ref 43.0–77.0)
Neutro Abs: 4.2 10*3/uL (ref 1.4–7.7)
Platelets: 218 10*3/uL (ref 150.0–400.0)
RBC: 4.9 Mil/uL (ref 4.22–5.81)
RDW: 14.4 % (ref 11.5–15.5)
WBC: 6.4 10*3/uL (ref 4.0–10.5)

## 2016-08-30 LAB — URINALYSIS, ROUTINE W REFLEX MICROSCOPIC
BILIRUBIN URINE: NEGATIVE
Hgb urine dipstick: NEGATIVE
Ketones, ur: NEGATIVE
LEUKOCYTES UA: NEGATIVE
Nitrite: NEGATIVE
PH: 5.5 (ref 5.0–8.0)
RBC / HPF: NONE SEEN (ref 0–?)
SPECIFIC GRAVITY, URINE: 1.015 (ref 1.000–1.030)
Total Protein, Urine: NEGATIVE
Urine Glucose: NEGATIVE
Urobilinogen, UA: 0.2 (ref 0.0–1.0)

## 2016-08-30 LAB — HEMOGLOBIN A1C: HEMOGLOBIN A1C: 6.3 % (ref 4.6–6.5)

## 2016-08-30 LAB — BRAIN NATRIURETIC PEPTIDE: Pro B Natriuretic peptide (BNP): 11 pg/mL (ref 0.0–100.0)

## 2016-08-30 MED ORDER — DESLORATADINE 5 MG PO TABS: 5.0000 mg | ORAL_TABLET | Freq: Every day | ORAL | 3 refills | Status: DC

## 2016-08-30 MED ORDER — FUROSEMIDE 20 MG PO TABS: 20.0000 mg | ORAL_TABLET | Freq: Every day | ORAL | 3 refills | Status: DC

## 2016-08-30 MED ORDER — SIMVASTATIN 40 MG PO TABS: 40.0000 mg | ORAL_TABLET | Freq: Every evening | ORAL | 3 refills | Status: DC

## 2016-08-30 NOTE — Progress Notes (Signed)
Subjective:    Patient ID: Denton Meek Sr., male    DOB: [birth year], 66 y.o.   MRN: [id number]  HPI  Here for wellness and f/u;  Overall doing ok;    Pt denies neurological change such as new headache, facial or extremity weakness.  Pt denies polydipsia, polyuria, or low sugar symptoms. Pt states overall good compliance with treatment and medications, good tolerability, and has been trying to follow appropriate diet.  Pt denies worsening depressive symptoms, suicidal ideation or panic. No fever, night sweats, wt loss, loss of appetite, or other constitutional symptoms.  Pt states good ability with ADL's, has low fall risk, home safety reviewed and adequate, no other significant changes in hearing or vision, and not active with exercise.  Pt denies chest pain, increased sob or doe, wheezing, orthopnea, PND, palpitations, dizziness or syncope, but has had unusual new onset several months gradually worsening LE edema.  No piror hx of CHF, pulm HTN, hepatorenal dz or venous insufficiency  Wt is increased as well Wt Readings from Last 3 Encounters:  08/30/16 281 lb (127.5 kg)  01/26/16 266 lb (120.7 kg)  07/16/15 270 lb (122.5 kg)  Also has x of right knee arthoscopy, but now with several months worsening left knee pain with swelling comes and goes, mild to mod, intermittent, worse to walk and standup, better to sit, nothing else makes better or worse, and has had locking effect several times in the past month, sometimes pain wakes him up.   Has urology f/u appt next month for BPH, now urgency getting worse.  Past Medical History:  Diagnosis Date  . Acute pharyngitis 01/26/2009  . ALLERGIC RHINITIS 08/07/2007  . ARTHRITIS 08/07/2007  . BACK PAIN 09/15/2009  . BENIGN PROSTATIC HYPERTROPHY 05/20/2008  . EATING DISORDER, HX OF 08/07/2007  . ERECTILE DYSFUNCTION 02/18/2008  . HYPERLIPIDEMIA 08/07/2007  . HYPERTENSION 08/07/2007  . NECK PAIN, RIGHT 09/15/2009  . 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  SINUSITIS- ACUTE-NOS 04/30/2010   Past Surgical History:  Procedure Laterality Date  . GASTRIC BYPASS  2006  . Left foot surgery  2008  . Lower back  2004  . Right knee arthroscopy  1994    reports that he has never smoked. He has never used smokeless tobacco. He reports that he does not drink alcohol or use drugs. family history includes Heart disease in his father; Hyperlipidemia in his father; Hypertension in his father. Allergies  Allergen Reactions  . Ace Inhibitors     REACTION: tongue swelling   Current Outpatient Prescriptions on File Prior to Visit  Medication Sig Dispense Refill  . acetaminophen (TYLENOL ARTHRITIS PAIN) 650 MG CR tablet Take 500 mg by mouth every 8 (eight) hours as needed.     Marland Kitchen amLODipine (NORVASC) 5 MG tablet Take 1 tablet (5 mg total) by mouth daily. 90 tablet 3  . aspirin 81 MG tablet Take 81 mg by mouth daily.      Marland Kitchen CIALIS 20 MG tablet TAKE 1 TABLET BY MOUTH DAILY AS NEEDED. 10 tablet 4  . cyclobenzaprine (FLEXERIL) 5 MG tablet TAKE 1 TABLET (5 MG TOTAL) BY MOUTH 3 (THREE) TIMES DAILY AS NEEDED FOR MUSCLE SPASMS. 60 tablet 1  . desloratadine (CLARINEX) 5 MG tablet Take 1 tablet (5 mg total) by mouth daily. Yearly physical w/labs due in May 90 tablet 0  . flintstones complete (FLINTSTONES) 60 MG chewable tablet Chew 1 tablet by mouth daily.      . Omega-3 Krill Oil 500  MG CAPS Take 1 capsule by mouth daily.    . sildenafil (VIAGRA) 100 MG tablet Take 0.5-1 tablets (50-100 mg total) by mouth daily as needed for erectile dysfunction. 5 tablet 11  . silodosin (RAPAFLO) 4 MG CAPS capsule Take 1 capsule (4 mg total) by mouth daily with breakfast. 30 capsule 11  . simvastatin (ZOCOR) 40 MG tablet TAKE 1 TABLET EVERY EVENING 90 tablet 3  . traMADol (ULTRAM) 50 MG tablet TAKE 1 TABLET BY MOUTH EVERY 6 HOURS AS NEEDED FOR MODERATE PAIN 120 tablet 2  . valsartan (DIOVAN) 160 MG tablet Take 1 tablet (160 mg total) by mouth daily. 90 tablet 3   No current  facility-administered medications on file prior to visit.    Review of Systems Constitutional: Negative for other unusual diaphoresis, sweats, appetite or weight changes HENT: Negative for other worsening hearing loss, ear pain, facial swelling, mouth sores or neck stiffness.   Eyes: Negative for other worsening pain, redness or other visual disturbance.  Respiratory: Negative for other stridor or swelling Cardiovascular: Negative for other palpitations or other chest pain  Gastrointestinal: Negative for worsening diarrhea or loose stools, blood in stool, distention or other pain Genitourinary: Negative for hematuria, flank pain or other change in urine volume.  Musculoskeletal: Negative for myalgias or other joint swelling.  Skin: Negative for other color change, or other wound or worsening drainage.  Neurological: Negative for other syncope or numbness. Hematological: Negative for other adenopathy or swelling Psychiatric/Behavioral: Negative for hallucinations, other worsening agitation, SI, self-injury, or new decreased concentration All other system neg per pt    Objective:   Physical Exam BP 128/84   Pulse 77   Ht 5\' 11"  (1.803 m)   Wt 281 lb (127.5 kg)   SpO2 98%   BMI 39.19 kg/m  VS noted, not ill appearing Constitutional: Pt is oriented to person, place, and time. Appears well-developed and well-nourished, in no significant distress and comfortable Head: Normocephalic and atraumatic  Eyes: Conjunctivae and EOM are normal. Pupils are equal, round, and reactive to light Right Ear: External ear normal without discharge Left Ear: External ear normal without discharge Nose: Nose without discharge or deformity Mouth/Throat: Oropharynx is without other ulcerations and moist  Neck: Normal range of motion. Neck supple. No JVD present. No tracheal deviation present or significant neck LA or mass Cardiovascular: Normal rate, regular rhythm, normal heart sounds and intact distal  pulses.   Pulmonary/Chest: WOB normal and breath sounds without rales or wheezing  Abdominal: Soft. Bowel sounds are normal. NT. No HSM  Musculoskeletal: Normal range of motion. Exhibits 2+ bilat LE edema to knees Left knee with crepitus and small efusion Lymphadenopathy: Has no other cervical adenopathy.  Neurological: Pt is alert and oriented to person, place, and time. Pt has normal reflexes. No cranial nerve deficit. Motor grossly intact, Gait intact Skin: Skin is warm and dry. No rash noted or new ulcerations Psychiatric:  Has normal mood and affect. Behavior is normal without agitation No other exam findings  Unable for ecg today due to equipment not working  Lab Results  Component Value Date   WBC 6.7 07/16/2015   HGB 13.7 07/16/2015   HCT 40.4 07/16/2015   PLT 200.0 07/16/2015   GLUCOSE 73 07/16/2015   CHOL 161 07/16/2015   TRIG 42.0 07/16/2015   HDL 52.50 07/16/2015   LDLDIRECT 157.6 01/02/2009   LDLCALC 100 (H) 07/16/2015   ALT 14 07/16/2015   AST 17 07/16/2015   NA 141 07/16/2015  K 3.7 07/16/2015   CL 103 07/16/2015   CREATININE 0.84 07/16/2015   BUN 21 07/16/2015   CO2 30 07/16/2015   TSH 0.83 07/16/2015   PSA 0.65 07/16/2015        Assessment & Plan:

## 2016-08-30 NOTE — Assessment & Plan Note (Signed)

## 2016-08-30 NOTE — Assessment & Plan Note (Addendum)
Etiology unclear, unable for ecg at this time due to equipment availability, for echo, BNP and labs as ordered., also lasix 20 qd, with f/u at 1 month  In addition to the time spent performing CPE, I spent an additional 25 minutes face to face,in which greater than 50% of this time was spent in counseling and coordination of care for patient's acute illness as documented, including d/w pt regarding diff dx and eval and tx of peripiheral edema, left knee pain, and glucosuria

## 2016-08-30 NOTE — Assessment & Plan Note (Signed)
C/w degenerative dz, for appt with Dr Smith/sports med

## 2016-08-30 NOTE — Assessment & Plan Note (Signed)
stable overall by history and exam, recent data reviewed with pt, and pt to continue medical treatment as before,  to f/u any worsening symptoms or concerns BP Readings from Last 3 Encounters:  08/30/16 128/84  01/26/16 140/78  07/16/15 140/86

## 2016-08-30 NOTE — Assessment & Plan Note (Signed)
Doubt DM, for a1c today with labs

## 2016-08-30 NOTE — Patient Instructions (Addendum)
You had the Prevnar pneumonia shot today  Please take all new medication as prescribed - the lasix 20 mg per day  Please continue all other medications as before, and refills have been done if requested.  Please have the pharmacy call with any other refills you may need.  Please continue your efforts at being more active, low cholesterol diet, and weight control.  You are otherwise up to date with prevention measures today.  Please keep your appointments with your specialists as you may have planned  You will be contacted regarding the referral for: colonoscopy  You will be contacted regarding the referral for: Dr Tamala Julian  Please go to the LAB in the Basement (turn left off the elevator) for the tests to be done today  You will be contacted regarding the referral for: Echocardiogram  You will be contacted by phone if any changes need to be made immediately.  Otherwise, you will receive a letter about your results with an explanation, but please check with MyChart first.  Please return in 1 months, or sooner if needed

## 2016-08-31 ENCOUNTER — Telehealth: Payer: Self-pay

## 2016-08-31 LAB — HEPATIC FUNCTION PANEL
ALBUMIN: 4.1 g/dL (ref 3.5–5.2)
ALK PHOS: 67 U/L (ref 39–117)
ALT: 14 U/L (ref 0–53)
AST: 18 U/L (ref 0–37)
Bilirubin, Direct: 0.1 mg/dL (ref 0.0–0.3)
TOTAL PROTEIN: 6.4 g/dL (ref 6.0–8.3)
Total Bilirubin: 0.4 mg/dL (ref 0.2–1.2)

## 2016-08-31 LAB — BASIC METABOLIC PANEL
BUN: 16 mg/dL (ref 6–23)
CHLORIDE: 103 meq/L (ref 96–112)
CO2: 29 meq/L (ref 19–32)
Calcium: 9.6 mg/dL (ref 8.4–10.5)
Creatinine, Ser: 0.87 mg/dL (ref 0.40–1.50)
GFR: 113.03 mL/min (ref >60.00)
GLUCOSE: 93 mg/dL (ref 70–99)
POTASSIUM: 4.3 meq/L (ref 3.5–5.1)
SODIUM: 139 meq/L (ref 135–145)

## 2016-08-31 LAB — LIPID PANEL
CHOLESTEROL: 213 mg/dL — AB (ref 0–200)
HDL: 55.1 mg/dL (ref >39.00)
LDL Cholesterol: 142 mg/dL — ABNORMAL HIGH (ref 0–99)
NonHDL: 157.96
Total CHOL/HDL Ratio: 4
Triglycerides: 79 mg/dL (ref 0.0–149.0)
VLDL: 15.8 mg/dL (ref 0.0–40.0)

## 2016-08-31 LAB — TSH: TSH: 0.97 u[IU]/mL (ref 0.35–4.50)

## 2016-08-31 LAB — HIV ANTIBODY (ROUTINE TESTING W REFLEX): HIV 1&2 Ab, 4th Generation: NONREACTIVE

## 2016-08-31 LAB — PSA: PSA: 0.88 ng/mL (ref 0.10–4.00)

## 2016-08-31 NOTE — Telephone Encounter (Signed)
   [facility] [address] called and said that its a drug interaction with:  Simvasatin and amlodipine   They would like to clarify is pt is suppose to be on both?

## 2016-09-01 ENCOUNTER — Other Ambulatory Visit: Payer: Self-pay | Admitting: Internal Medicine

## 2016-09-01 MED ORDER — ROSUVASTATIN CALCIUM 20 MG PO TABS: 20.0000 mg | ORAL_TABLET | Freq: Every day | ORAL | 3 refills | Status: DC

## 2016-09-01 NOTE — Telephone Encounter (Addendum)
Called pt, left an urgent VM to call me back to discuss medications.   Called pharmacist back and advised her on the changes also. I told her I had tried to contact the pt to make him aware and she said when the pt comes there she will relay the msg to him of the changes also.

## 2016-09-01 NOTE — Telephone Encounter (Signed)
Ok to let pharmacy AND patient know  Due to possible adverse side effects, the FDA no longer recommends the amlodipine and the zocor medications to  Be taken together  OK to cont the amlodipine, but stop the zocor (simvastatin) and change to crestor 20 mg per day (I have sent new rx)

## 2016-09-19 ENCOUNTER — Encounter (HOSPITAL_COMMUNITY): Payer: Self-pay | Admitting: *Deleted

## 2016-09-19 ENCOUNTER — Other Ambulatory Visit: Payer: Self-pay

## 2016-09-19 ENCOUNTER — Ambulatory Visit (HOSPITAL_COMMUNITY): Payer: Managed Care, Other (non HMO) | Attending: Cardiovascular Disease

## 2016-09-19 DIAGNOSIS — R609 Edema, unspecified: Secondary | ICD-10-CM | POA: Diagnosis not present

## 2016-09-19 NOTE — Progress Notes (Unsigned)
2D Echo Performed.  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  Dr. Marlou Porch (DOD) was notified, who asked me to make a note and let the patient follow up with the ordering physician.  Joshua Burnett is stable and has no complaints during the exam.    Deliah Boston, RDCS

## 2016-09-22 ENCOUNTER — Encounter: Payer: Self-pay | Admitting: Family Medicine

## 2016-09-22 ENCOUNTER — Ambulatory Visit (INDEPENDENT_AMBULATORY_CARE_PROVIDER_SITE_OTHER): Payer: Managed Care, Other (non HMO) | Admitting: Family Medicine

## 2016-09-22 DIAGNOSIS — M1712 Unilateral primary osteoarthritis, left knee: Secondary | ICD-10-CM | POA: Diagnosis not present

## 2016-09-22 MED ORDER — VITAMIN D (ERGOCALCIFEROL) 1.25 MG (50000 UNIT) PO CAPS: 50000.0000 [IU] | ORAL_CAPSULE | ORAL | 0 refills | Status: DC

## 2016-09-22 NOTE — Patient Instructions (Addendum)
Good to see you.  Ice 20 minutes 2 times daily. Usually after activity and before bed. Exercises 3 times a week.  pennsaid pinkie amount topically 2 times daily as needed.  Once weekly vitamin D for 12 weeks.  Over the counter tart cherry extract at night Spenco orthotics "total support" online would be great  See me again in 4 weeks.

## 2016-09-22 NOTE — Progress Notes (Signed)
[facility] [address] [place], [place] [address] Phone: [phone] Subjective:    I'm seeing this patient by the request  of:  Biagio Borg, MD   CC: Left knee pain  PYP:[id number]  Joshua Mikesell Sr. is a 66 y.o. male coming in with complaint of left knee pain. Has had this for multiple months. Some mild instability. States swelling from time to time but no radiation of pain. Seems to stay localized to the knee and worse with going up or down hills or walking long distances. Does not wake him up at night. Rates the severity of pain a 6 out of 10. Tries to avoid over-the-counter pain medications when possible.   Patient did have x-rays taken 03/08/2015. This was independently visualized by me showing moderate osteophytic changes mostly of the medial compartment. Past Medical History:  Diagnosis Date  . Acute pharyngitis 01/26/2009  . ALLERGIC RHINITIS 08/07/2007  . ARTHRITIS 08/07/2007  . BACK PAIN 09/15/2009  . BENIGN PROSTATIC HYPERTROPHY 05/20/2008  . EATING DISORDER, HX OF 08/07/2007  . ERECTILE DYSFUNCTION 02/18/2008  . HYPERLIPIDEMIA 08/07/2007  . HYPERTENSION 08/07/2007  . NECK PAIN, RIGHT 09/15/2009  . 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 SINUSITIS- ACUTE-NOS 04/30/2010   Past Surgical History:  Procedure Laterality Date  . GASTRIC BYPASS  2006  . Left foot surgery  2008  . Lower back  2004  . Right knee arthroscopy  1994   Social History   Social History  . Marital status: Married    Spouse name: N/A  . Number of children: N/A  . Years of education: N/A   Social History Main Topics  . Smoking status: Never Smoker  . Smokeless tobacco: Never Used  . Alcohol use No  . Drug use: No  . Sexual activity: Not on file   Other Topics Concern  . Not on file   Social History Narrative  . No narrative on file   Allergies  Allergen Reactions  . Ace Inhibitors     REACTION: tongue swelling   Family History  Problem Relation Age  of Onset  . Hyperlipidemia Father   . Heart disease Father   . Hypertension Father     Past medical history, social, surgical and family history all reviewed in electronic medical record.  No pertanent information unless stated regarding to the chief complaint.   Review of Systems:Review of systems updated and as accurate as of 09/22/16  No headache, visual changes, nausea, vomiting, diarrhea, constipation, dizziness, abdominal pain, skin rash, fevers, chills, night sweats, weight loss, swollen lymph nodes, body aches, joint swelling, chest pain, shortness of breath, mood changes. Positive muscle aches  Objective  Blood pressure 128/86, pulse 83, weight 283 lb (128.4 kg), SpO2 97 %. Systems examined below as of 09/22/16   General: No apparent distress alert and oriented x3 mood and affect normal, dressed appropriately.  HEENT: Pupils equal, extraocular movements intact  Respiratory: Patient's speak in full sentences and does not appear short of breath  Cardiovascular: No lower extremity edema, non tender, no erythema  Skin: Warm dry intact with no signs of infection or rash on extremities or on axial skeleton.  Abdomen: Soft nontender  Neuro: Cranial nerves II through XII are intact, neurovascularly intact in all extremities with 2+ DTRs and 2+ pulses.  Lymph: No lymphadenopathy of posterior or anterior cervical chain or axillae bilaterally.  Gait normal with good balance and coordination.  MSK:  Non tender with full range of  motion and good stability and symmetric strength and tone of shoulders, elbows, wrist, hip, and ankles bilaterally.  Knee: Left valgus deformity noted. Large thigh to calf ratio.  Tender to palpation over medial and PF joint line.  ROM full in flexion and extension and lower leg rotation. instability with valgus force.  painful patellar compression. Patellar glide with moderate crepitus. Patellar and quadriceps tendons unremarkable. Hamstring and quadriceps  strength is normal. Contralateral knee shows mild arthritic changes but no pain     Impression and Recommendations:     This case required medical decision making of moderate complexity.      Note: This dictation was prepared with Dragon dictation along with smaller phrase technology. Any transcriptional errors that result from this process are unintentional.

## 2016-09-22 NOTE — Assessment & Plan Note (Signed)
Patient does have a fairly large thigh to calf ratio and does have moderate osteoarthritic changes of the knee. Discussed with patient at great length. 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 Encourage weight loss, discussed over-the-counter orthotics, given topical anti-inflammatories. Home exercises were given. Follow-up again in 4 weeks. Worsening symptoms we'll consider injection and formal physical therapy.

## 2016-09-27 ENCOUNTER — Encounter: Payer: Self-pay | Admitting: Internal Medicine

## 2016-09-28 ENCOUNTER — Ambulatory Visit (AMBULATORY_SURGERY_CENTER): Payer: Self-pay

## 2016-09-28 VITALS — Ht 71.0 in | Wt 284.4 lb

## 2016-09-28 DIAGNOSIS — Z1211 Encounter for screening for malignant neoplasm of colon: Secondary | ICD-10-CM

## 2016-09-28 MED ORDER — NA SULFATE-K SULFATE-MG SULF 17.5-3.13-1.6 GM/177ML PO SOLN: 1.0000 | Freq: Once | ORAL | 0 refills | Status: AC

## 2016-09-28 NOTE — Progress Notes (Signed)
Denies allergies to eggs or soy products. Denies complication of anesthesia or sedation. Denies use of weight loss medication. Denies use of O2.   Emmi instructions given for colonoscopy.  

## 2016-09-29 ENCOUNTER — Encounter: Payer: Self-pay | Admitting: Internal Medicine

## 2016-09-29 ENCOUNTER — Ambulatory Visit (INDEPENDENT_AMBULATORY_CARE_PROVIDER_SITE_OTHER): Payer: Managed Care, Other (non HMO) | Admitting: Internal Medicine

## 2016-09-29 VITALS — BP 142/90 | HR 75 | Ht 71.0 in | Wt 284.0 lb

## 2016-09-29 DIAGNOSIS — I5032 Chronic diastolic (congestive) heart failure: Secondary | ICD-10-CM

## 2016-09-29 DIAGNOSIS — Z0000 Encounter for general adult medical examination without abnormal findings: Secondary | ICD-10-CM | POA: Diagnosis not present

## 2016-09-29 DIAGNOSIS — I10 Essential (primary) hypertension: Secondary | ICD-10-CM | POA: Diagnosis not present

## 2016-09-29 DIAGNOSIS — H1013 Acute atopic conjunctivitis, bilateral: Secondary | ICD-10-CM

## 2016-09-29 MED ORDER — DESLORATADINE 5 MG PO TABS: 5.0000 mg | ORAL_TABLET | Freq: Every day | ORAL | 3 refills | Status: DC

## 2016-09-29 MED ORDER — SILODOSIN 4 MG PO CAPS: 4.0000 mg | ORAL_CAPSULE | Freq: Every day | ORAL | 11 refills | Status: DC

## 2016-09-29 NOTE — Patient Instructions (Addendum)
Please continue all other medications as before, and refills have been done if requested.  Please have the pharmacy call with any other refills you may need.  Please call if you would want the [facility] referral for ED  Please continue your efforts at being more active, low cholesterol diet, and weight control.  Please keep your appointments with your specialists as you may have planned   Please return in 6 months, or sooner if needed, with Lab testing done 3-5 days before

## 2016-09-29 NOTE — Progress Notes (Signed)
Subjective:    Patient ID: Joshua Meek Sr., male    DOB: [birth year], 66 y.o.   MRN: [id number]  HPI  Here to f/u; overall doing ok,  Pt denies chest pain, increasing sob or doe, wheezing, orthopnea, PND, increased LE swelling, palpitations, dizziness or syncope.  Pt denies new neurological symptoms such as new headache, or facial or extremity weakness or numbness.  Pt denies polydipsia, polyuria, or low sugar episode.  Pt states overall good compliance with meds, mostly trying to follow appropriate diet, with wt overall stable,  but little exercise however. Does have several wks ongoing nasal allergy symptoms with clearish congestion, itch and sneezing, without fever, pain, ST, cough, swelling or wheezing. Wt Readings from Last 3 Encounters:  09/29/16 284 lb (128.8 kg)  09/28/16 284 lb 6.4 oz (129 kg)  09/22/16 283 lb (128.4 kg)  Plans to retire soon.   Saw Dr Tamala Julian for left knee Saw Dr McDermott/urology  - had increased rapaflo to double dose with improvement  Has tried viagra and cialis without good success, was rec'd to consider teritiary care such as [facility], but declines for now Past Medical History:  Diagnosis Date  . Acute pharyngitis 01/26/2009  . ALLERGIC RHINITIS 08/07/2007  . Allergy   . ARTHRITIS 08/07/2007  . BACK PAIN 09/15/2009  . BENIGN PROSTATIC HYPERTROPHY 05/20/2008  . Cataract   . EATING DISORDER, HX OF 08/07/2007  . ERECTILE DYSFUNCTION 02/18/2008  . HYPERLIPIDEMIA 08/07/2007  . HYPERTENSION 08/07/2007  . NECK PAIN, RIGHT 09/15/2009  . 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 SINUSITIS- ACUTE-NOS 04/30/2010  . Sleep apnea    Past Surgical History:  Procedure Laterality Date  . GASTRIC BYPASS  2006  . Left foot surgery  2008  . Lower back  2004  . Right knee arthroscopy  1994    reports that he has never smoked. He has never used smokeless tobacco. He reports that he drinks alcohol. He reports that he does not use drugs. family history includes Heart disease in his  father; Hyperlipidemia in his father; Hypertension in his father. Allergies  Allergen Reactions  . Ace Inhibitors     REACTION: tongue swelling   Current Outpatient Prescriptions on File Prior to Visit  Medication Sig Dispense Refill  . acetaminophen (TYLENOL ARTHRITIS PAIN) 650 MG CR tablet Take 500 mg by mouth every 8 (eight) hours as needed.     Marland Kitchen amLODipine (NORVASC) 5 MG tablet Take 1 tablet (5 mg total) by mouth daily. 90 tablet 3  . aspirin 81 MG tablet Take 81 mg by mouth daily.      Marland Kitchen CIALIS 20 MG tablet TAKE 1 TABLET BY MOUTH DAILY AS NEEDED. 10 tablet 4  . cyclobenzaprine (FLEXERIL) 5 MG tablet TAKE 1 TABLET (5 MG TOTAL) BY MOUTH 3 (THREE) TIMES DAILY AS NEEDED FOR MUSCLE SPASMS. 60 tablet 1  . flintstones complete (FLINTSTONES) 60 MG chewable tablet Chew 1 tablet by mouth daily.      . furosemide (LASIX) 20 MG tablet Take 1 tablet (20 mg total) by mouth daily. 90 tablet 3  . Omega-3 Krill Oil 500 MG CAPS Take 1 capsule by mouth daily.    Marland Kitchen OVER THE COUNTER MEDICATION Black Cherry Capsule one time a day.    . rosuvastatin (CRESTOR) 20 MG tablet Take 1 tablet (20 mg total) by mouth daily. 90 tablet 3  . sildenafil (VIAGRA) 100 MG tablet Take 0.5-1 tablets (50-100 mg total) by mouth daily as needed for erectile dysfunction. 5  tablet 11  . traMADol (ULTRAM) 50 MG tablet TAKE 1 TABLET BY MOUTH EVERY 6 HOURS AS NEEDED FOR MODERATE PAIN 120 tablet 2  . valsartan (DIOVAN) 160 MG tablet Take 1 tablet (160 mg total) by mouth daily. 90 tablet 3  . Vitamin D, Ergocalciferol, (DRISDOL) 50000 units CAPS capsule Take 1 capsule (50,000 Units total) by mouth every 7 (seven) days. 12 capsule 0   No current facility-administered medications on file prior to visit.    Review of Systems  Constitutional: Negative for other unusual diaphoresis or sweats HENT: Negative for ear discharge or swelling Eyes: Negative for other worsening visual disturbances Respiratory: Negative for stridor or other  swelling  Gastrointestinal: Negative for worsening distension or other blood Genitourinary: Negative for retention or other urinary change Musculoskeletal: Negative for other MSK pain or swelling Skin: Negative for color change or other new lesions Neurological: Negative for worsening tremors and other numbness  Psychiatric/Behavioral: Negative for worsening agitation or other fatigue All other system neg per pt    Objective:   Physical Exam BP (!) 142/90   Pulse 75   Ht 5\' 11"  (1.803 m)   Wt 284 lb (128.8 kg)   SpO2 98%   BMI 39.61 kg/m  VS noted, morbid obeswe Constitutional: Pt appears in NAD HENT: Head: NCAT.  Right Ear: External ear normal.  Left Ear: External ear normal.  Eyes: . Pupils are equal, round, and reactive to light. Conjunctivae and EOM are normal Nose: without d/c or deformity Neck: Neck supple. Gross normal ROM Cardiovascular: Normal rate and regular rhythm.   Pulmonary/Chest: Effort normal and breath sounds without rales or wheezing.  Abd:  Soft, NT, ND, + BS, no organomegaly Neurological: Pt is alert. At baseline orientation, motor grossly intact Skin: Skin is warm. No rashes, other new lesions, has trace bilat ankle LE edema Psychiatric: Pt behavior is normal without agitation  No other exam findings Lab Results  Component Value Date   WBC 6.4 08/30/2016   HGB 13.6 08/30/2016   HCT 41.7 08/30/2016   PLT 218.0 08/30/2016   GLUCOSE 93 08/30/2016   CHOL 213 (H) 08/30/2016   TRIG 79.0 08/30/2016   HDL 55.10 08/30/2016   LDLDIRECT 157.6 01/02/2009   LDLCALC 142 (H) 08/30/2016   ALT 14 08/30/2016   AST 18 08/30/2016   NA 139 08/30/2016   K 4.3 08/30/2016   CL 103 08/30/2016   CREATININE 0.87 08/30/2016   BUN 16 08/30/2016   CO2 29 08/30/2016   TSH 0.97 08/30/2016   PSA 0.88 08/30/2016   HGBA1C 6.3 08/30/2016       Assessment & Plan:

## 2016-09-30 ENCOUNTER — Encounter: Payer: Self-pay | Admitting: Gastroenterology

## 2016-10-02 NOTE — Assessment & Plan Note (Signed)
stable overall by history and exam, recent data reviewed with pt, and pt to continue medical treatment as before,  to f/u any worsening symptoms or concerns  

## 2016-10-02 NOTE — Assessment & Plan Note (Signed)
Improved, cont same tx 

## 2016-10-02 NOTE — Assessment & Plan Note (Signed)
Ok for xyzal 5 mg daily

## 2016-10-10 ENCOUNTER — Encounter: Payer: Managed Care, Other (non HMO) | Admitting: Gastroenterology

## 2016-10-19 NOTE — Progress Notes (Signed)
[facility] [address], [place] [address] Phone: [phone] Subjective:    I'm seeing this patient by the request  of:  Biagio Borg, MD   CC: Left knee painFollow-up  TFT:[id number]  Joshua Toomey Sr. is a 66 y.o. male coming in with complaint of left knee pain. Done have degenerative arthritis of the knee. Patient went into conservative therapy including icing regimen, topical anti-inflammatories, once weekly vitamin D. Patient states that he is feeling 80-90% better. Feels that the over-the-counter medications, home exercises and icing regularly has been helpful.   Patient did have x-rays taken 03/08/2015. This was independently visualized by me showing moderate osteophytic changes mostly of the medial compartment. Past Medical History:  Diagnosis Date  . Acute pharyngitis 01/26/2009  . ALLERGIC RHINITIS 08/07/2007  . Allergy   . ARTHRITIS 08/07/2007  . BACK PAIN 09/15/2009  . BENIGN PROSTATIC HYPERTROPHY 05/20/2008  . Cataract   . EATING DISORDER, HX OF 08/07/2007  . ERECTILE DYSFUNCTION 02/18/2008  . HYPERLIPIDEMIA 08/07/2007  . HYPERTENSION 08/07/2007  . NECK PAIN, RIGHT 09/15/2009  . 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 SINUSITIS- ACUTE-NOS 04/30/2010  . Sleep apnea    Past Surgical History:  Procedure Laterality Date  . GASTRIC BYPASS  2006  . Left foot surgery  2008  . Lower back  2004  . Right knee arthroscopy  1994   Social History   Social History  . Marital status: Married    Spouse name: N/A  . Number of children: N/A  . Years of education: N/A   Social History Main Topics  . Smoking status: Never Smoker  . Smokeless tobacco: Never Used  . Alcohol use Yes     Comment: occasional beer   . Drug use: No  . Sexual activity: Not Asked   Other Topics Concern  . None   Social History Narrative  . None   Allergies  Allergen Reactions  . Ace Inhibitors     REACTION: tongue swelling   Family History  Problem  Relation Age of Onset  . Hyperlipidemia Father   . Heart disease Father   . Hypertension Father   . Colon polyps Neg Hx   . Esophageal cancer Neg Hx   . Rectal cancer Neg Hx   . Stomach cancer Neg Hx   . Pancreatic cancer Neg Hx     Past medical history, social, surgical and family history all reviewed in electronic medical record.  No pertanent information unless stated regarding to the chief complaint.   Review of Systems: No headache, visual changes, nausea, vomiting, diarrhea, constipation, dizziness, abdominal pain, skin rash, fevers, chills, night sweats, weight loss, swollen lymph nodes, body aches, joint swelling, muscle aches, chest pain, shortness of breath, mood changes.     Objective  Blood pressure 132/80, pulse (!) 56, height 5\' 11"  (1.803 m), weight 282 lb (127.9 kg). Systems examined below as of 10/20/16   Systems examined below as of 10/20/16 General: NAD A&O x3 mood, affect normal  HEENT: Pupils equal, extraocular movements intact no nystagmus Respiratory: not short of breath at rest or with speaking Cardiovascular: No lower extremity edema, non tender Skin: Warm dry intact with no signs of infection or rash on extremities or on axial skeleton. Abdomen: Soft nontender, no masses Neuro: Cranial nerves  intact, neurovascularly intact in all extremities with 2+ DTRs and 2+ pulses. Lymph: No lymphadenopathy appreciated today  Gait normal with good balance and coordination.  MSK: Non tender  with full range of motion and good stability and symmetric strength and tone of shoulders, elbows, wrist,  hips and ankles bilaterally.   Knee: Left Valgus deformity noted lateral tilt noted of the left knee Palpation normal with no warmth, joint line tenderness, patellar tenderness, or condyle tenderness. ROM full in flexion and extension and lower leg rotation. Mild instability noted. Negative Mcmurray's, Apley's, and Thessalonian tests. Non painful patellar  compression. Patellar glide without crepitus. Patellar and quadriceps tendons unremarkable. Hamstring and quadriceps strength is normal.  Contralateral knee mild arthritic change is but no pain     Impression and Recommendations:     This case required medical decision making of moderate complexity.      Note: This dictation was prepared with Dragon dictation along with smaller phrase technology. Any transcriptional errors that result from this process are unintentional.

## 2016-10-20 ENCOUNTER — Ambulatory Visit (INDEPENDENT_AMBULATORY_CARE_PROVIDER_SITE_OTHER): Payer: Managed Care, Other (non HMO) | Admitting: Family Medicine

## 2016-10-20 ENCOUNTER — Encounter: Payer: Self-pay | Admitting: Family Medicine

## 2016-10-20 DIAGNOSIS — M1712 Unilateral primary osteoarthritis, left knee: Secondary | ICD-10-CM

## 2016-10-20 MED ORDER — DICLOFENAC SODIUM 2 % TD SOLN: 2.0000 "application " | Freq: Two times a day (BID) | TRANSDERMAL | 3 refills | Status: DC

## 2016-10-20 NOTE — Assessment & Plan Note (Signed)
Patient was doing significantly better. We discussed icing regimen. Continuing the conservative therapy and over-the-counter medications. I'll follow-up again in 2-3 months

## 2016-10-20 NOTE — Patient Instructions (Signed)
Good to see you  pennsaid pinkie amount topically 2 times daily as needed.   Ice 20 minutes 2 times daily. Usually after activity and before bed. Exercises still 1-2 times a week  Stay active See me when you need me

## 2016-10-26 ENCOUNTER — Encounter: Payer: Self-pay | Admitting: Internal Medicine

## 2016-10-27 MED ORDER — IRBESARTAN 150 MG PO TABS: 150.0000 mg | ORAL_TABLET | Freq: Every day | ORAL | 3 refills | Status: DC

## 2016-10-27 NOTE — Telephone Encounter (Signed)
diovan changed to avapro per pt request

## 2016-12-14 ENCOUNTER — Ambulatory Visit (AMBULATORY_SURGERY_CENTER): Payer: Managed Care, Other (non HMO) | Admitting: Gastroenterology

## 2016-12-14 ENCOUNTER — Encounter: Payer: Self-pay | Admitting: Gastroenterology

## 2016-12-14 VITALS — BP 141/99 | HR 67 | Temp 98.4°F | Resp 18 | Ht 71.0 in | Wt 284.0 lb

## 2016-12-14 DIAGNOSIS — D122 Benign neoplasm of ascending colon: Secondary | ICD-10-CM | POA: Diagnosis not present

## 2016-12-14 DIAGNOSIS — D120 Benign neoplasm of cecum: Secondary | ICD-10-CM

## 2016-12-14 DIAGNOSIS — Z1211 Encounter for screening for malignant neoplasm of colon: Secondary | ICD-10-CM

## 2016-12-14 DIAGNOSIS — D123 Benign neoplasm of transverse colon: Secondary | ICD-10-CM

## 2016-12-14 MED ORDER — SODIUM CHLORIDE 0.9 % IV SOLN: 500.0000 mL | INTRAVENOUS | Status: DC

## 2016-12-14 NOTE — Progress Notes (Signed)
Pt's states no medical or surgical changes since previsit or office visit. 

## 2016-12-14 NOTE — Patient Instructions (Signed)
**   Handouts given on polyps, diverticulosis, and hemorrhoids ** ** Avoid NSAIDS for 2 weeks ** Tylenol is okay!  YOU HAD AN ENDOSCOPIC PROCEDURE TODAY AT [facility]:   Refer to the procedure report that was given to you for any specific questions about what was found during the examination.  If the procedure report does not answer your questions, please call your gastroenterologist to clarify.  If you requested that your care partner not be given the details of your procedure findings, then the procedure report has been included in a sealed envelope for you to review at your convenience later.  YOU SHOULD EXPECT: Some feelings of bloating in the abdomen. Passage of more gas than usual.  Walking can help get rid of the air that was put into your GI tract during the procedure and reduce the bloating. If you had a lower endoscopy (such as a colonoscopy or flexible sigmoidoscopy) you may notice spotting of blood in your stool or on the toilet paper. If you underwent a bowel prep for your procedure, you may not have a normal bowel movement for a few days.  Please Note:  You might notice some irritation and congestion in your nose or some drainage.  This is from the oxygen used during your procedure.  There is no need for concern and it should clear up in a day or so.  SYMPTOMS TO REPORT IMMEDIATELY:   Following lower endoscopy (colonoscopy or flexible sigmoidoscopy):  Excessive amounts of blood in the stool  Significant tenderness or worsening of abdominal pains  Swelling of the abdomen that is new, acute  Fever of 100F or higher For urgent or emergent issues, a gastroenterologist can be reached at any hour by calling [phone].   DIET:  We do recommend a small meal at first, but then you may proceed to your regular diet.  Drink plenty of fluids but you should avoid alcoholic beverages for 24 hours.  ACTIVITY:  You should plan to take it easy for the rest of today and you  should NOT DRIVE or use heavy machinery until tomorrow (because of the sedation medicines used during the test).    FOLLOW UP: Our staff will call the number listed on your records the next business day following your procedure to check on you and address any questions or concerns that you may have regarding the information given to you following your procedure. If we do not reach you, we will leave a message.  However, if you are feeling well and you are not experiencing any problems, there is no need to return our call.  We will assume that you have returned to your regular daily activities without incident.  If any biopsies were taken you will be contacted by phone or by letter within the next 1-3 weeks.  Please call us at [phone] if you have not heard about the biopsies in 3 weeks.    SIGNATURES/CONFIDENTIALITY: You and/or your care partner have signed paperwork which will be entered into your electronic medical record.  These signatures attest to the fact that that the information above on your After Visit Summary has been reviewed and is understood.  Full responsibility of the confidentiality of this discharge information lies with you and/or your care-partner.

## 2016-12-14 NOTE — Op Note (Addendum)
[facility] Patient Name: Joshua Burnett Procedure Date: 12/14/2016 7:47 AM MRN: [id number] Endoscopist: Remo Lipps P. Aislin Onofre MD, MD Age: 66 Referring MD:  Date of Birth: 08/01/1950 Gender: Male Account #: [id number] Procedure:                Colonoscopy Indications:              Screening for colorectal malignant neoplasm Medicines:                Monitored Anesthesia Care Procedure:                Pre-Anesthesia Assessment:                           - Prior to the procedure, a History and Physical                            was performed, and patient medications and                            allergies were reviewed. The patient's tolerance of                            previous anesthesia was also reviewed. The risks                            and benefits of the procedure and the sedation                            options and risks were discussed with the patient.                            All questions were answered, and informed consent                            was obtained. Prior Anticoagulants: The patient has                            taken no previous anticoagulant or antiplatelet                            agents. ASA Grade Assessment: II - A patient with                            mild systemic disease. After reviewing the risks                            and benefits, the patient was deemed in                            satisfactory condition to undergo the procedure.                           After obtaining informed consent, the colonoscope  was passed under direct vision. Throughout the                            procedure, the patient's blood pressure, pulse, and                            oxygen saturations were monitored continuously. The                            Colonoscope was introduced through the anus and                            advanced to the the cecum, identified by                            appendiceal orifice  and ileocecal valve. The                            colonoscopy was performed without difficulty. The                            patient tolerated the procedure well. The quality                            of the bowel preparation was adequate. The                            ileocecal valve, appendiceal orifice, and rectum                            were photographed. Scope In: 7:59:37 AM Scope Out: 8:22:47 AM Scope Withdrawal Time: 0 hours 20 minutes 44 seconds  Total Procedure Duration: 0 hours 23 minutes 10 seconds  Findings:                 The perianal exam findings include non-thrombosed                            external hemorrhoids.                           A 3 mm polyp was found in the cecum. The polyp was                            sessile. The polyp was removed with a cold snare.                            Resection and retrieval were complete.                           A 4 mm polyp was found in the ascending colon. The                            polyp was sessile. The polyp was removed with a  cold snare. Resection and retrieval were complete.                           Four sessile polyps were found in the transverse                            colon. The polyps were 3 to 7 mm in size. These                            polyps were removed with a cold snare. Resection                            and retrieval were complete.                           Multiple medium-mouthed diverticula were found in                            the left colon and right colon.                           Internal hemorrhoids were found during                            retroflexion. The hemorrhoids were large.                           Suspected hypertrophied anal papilla(e) were noted                            in the distal rectum, although located more                            proximally than usual. Biopsies were taken with a                            cold forceps for  histology to ensure no adenomatous                            change.                           The exam was otherwise without abnormality. Complications:            No immediate complications. Estimated blood loss:                            Minimal. Estimated Blood Loss:     Estimated blood loss was minimal. Impression:               - Non-thrombosed external hemorrhoids found on                            perianal exam.                           -  One 3 mm polyp in the cecum, removed with a cold                            snare. Resected and retrieved.                           - One 4 mm polyp in the ascending colon, removed                            with a cold snare. Resected and retrieved.                           - Four 3 to 7 mm polyps in the transverse colon,                            removed with a cold snare. Resected and retrieved.                           - Diverticulosis in the left colon and in the right                            colon.                           - Internal hemorrhoids.                           - Suspected anal papilla(e) were hypertrophied.                            Biopsied.                           - The examination was otherwise normal. Recommendation:           - Patient has a contact number available for                            emergencies. The signs and symptoms of potential                            delayed complications were discussed with the                            patient. Return to normal activities tomorrow.                            Written discharge instructions were provided to the                            patient.                           - Resume previous diet.                           -  Continue present medications.                           - Await pathology results.                           - Repeat colonoscopy is recommended for                            surveillance. The colonoscopy date will be                             determined after pathology results from today's                            exam become available for review.                           - No ibuprofen, naproxen, or other non-steroidal                            anti-inflammatory drugs for 2 weeks after polyp                            removal. Remo Lipps P. Christabel Camire MD, MD 12/14/2016 8:29:21 AM This report has been signed electronically.

## 2016-12-14 NOTE — Progress Notes (Signed)
Called to room to assist during endoscopic procedure.  Patient ID and intended procedure confirmed with present staff. Received instructions for my participation in the procedure from the performing physician.  

## 2016-12-14 NOTE — Progress Notes (Signed)
Report to PACU, RN, vss, BBS= Clear.  

## 2016-12-15 ENCOUNTER — Telehealth: Payer: Self-pay

## 2016-12-15 NOTE — Telephone Encounter (Signed)
  Follow up Call-  Call back number 12/14/2016  Post procedure Call Back phone  #  [phone]  Permission to leave phone message Yes  Some recent data might be hidden     Patient questions:  Do you have a fever, pain , or abdominal swelling? No. Pain Score  0 *  Have you tolerated food without any problems? Yes.    Have you been able to return to your normal activities? Yes.    Do you have any questions about your discharge instructions: Diet   No. Medications  No. Follow up visit  No.  Do you have questions or concerns about your Care? No.  Actions: * If pain score is 4 or above: No action needed, pain <4.

## 2016-12-20 ENCOUNTER — Encounter: Payer: Self-pay | Admitting: Gastroenterology

## 2016-12-21 ENCOUNTER — Other Ambulatory Visit: Payer: Self-pay | Admitting: Internal Medicine

## 2016-12-21 ENCOUNTER — Other Ambulatory Visit: Payer: Self-pay | Admitting: Family Medicine

## 2016-12-22 NOTE — Telephone Encounter (Signed)
Refill done.  

## 2017-01-22 ENCOUNTER — Other Ambulatory Visit: Payer: Self-pay | Admitting: Internal Medicine

## 2017-01-23 NOTE — Telephone Encounter (Signed)
Done erx 

## 2017-02-06 ENCOUNTER — Other Ambulatory Visit: Payer: Self-pay | Admitting: Internal Medicine

## 2017-03-13 ENCOUNTER — Other Ambulatory Visit: Payer: Self-pay | Admitting: Family Medicine

## 2017-03-13 NOTE — Telephone Encounter (Signed)
Refill done.  

## 2017-04-05 ENCOUNTER — Ambulatory Visit: Payer: Managed Care, Other (non HMO) | Admitting: Internal Medicine

## 2017-04-06 ENCOUNTER — Encounter: Payer: Self-pay | Admitting: Internal Medicine

## 2017-04-06 ENCOUNTER — Ambulatory Visit (INDEPENDENT_AMBULATORY_CARE_PROVIDER_SITE_OTHER): Payer: Managed Care, Other (non HMO) | Admitting: Internal Medicine

## 2017-04-06 VITALS — BP 136/82 | HR 87 | Temp 98.4°F | Ht 71.0 in | Wt 283.0 lb

## 2017-04-06 DIAGNOSIS — R739 Hyperglycemia, unspecified: Secondary | ICD-10-CM

## 2017-04-06 DIAGNOSIS — I10 Essential (primary) hypertension: Secondary | ICD-10-CM

## 2017-04-06 DIAGNOSIS — F4321 Adjustment disorder with depressed mood: Secondary | ICD-10-CM | POA: Diagnosis not present

## 2017-04-06 DIAGNOSIS — Z0000 Encounter for general adult medical examination without abnormal findings: Secondary | ICD-10-CM | POA: Diagnosis not present

## 2017-04-06 DIAGNOSIS — E785 Hyperlipidemia, unspecified: Secondary | ICD-10-CM | POA: Diagnosis not present

## 2017-04-06 MED ORDER — ROSUVASTATIN CALCIUM 20 MG PO TABS: 20.0000 mg | ORAL_TABLET | Freq: Every day | ORAL | 3 refills | Status: DC

## 2017-04-06 NOTE — Assessment & Plan Note (Signed)
stable overall by history and exam, recent data reviewed with pt, and pt to continue medical treatment as before,  to f/u any worsening symptoms or concerns BP Readings from Last 3 Encounters:  04/06/17 136/82  12/14/16 (!) 141/99  10/20/16 132/80

## 2017-04-06 NOTE — Progress Notes (Signed)
Subjective:    Patient ID: Joshua Ishihara Sr., male    DOB: Apr 19, 1950, 67 y.o.   MRN: [id number]  HPI  Here to f/u; overall doing ok,  Pt denies chest pain, increasing sob or doe, wheezing, orthopnea, PND, increased LE swelling, palpitations, dizziness or syncope.  Pt denies new neurological symptoms such as new headache, or facial or extremity weakness or numbness.  Pt denies polydipsia, polyuria, or low sugar episode.  Pt states overall good compliance with meds, mostly trying to follow appropriate diet, with wt overall stable,  but little exercise however.  Peak wt has been 380, then down to 195, then now 283. Wt Readings from Last 3 Encounters:  04/06/17 283 lb (128.4 kg)  12/14/16 284 lb (128.8 kg)  2016-11-09 282 lb (127.9 kg)  Son died at 22yo last 12-19-2018from flu complications.  Grief since then and stress, not able to keep to a better diet.  Not taking crestor recenlty but willing to restart, tolerated ok. Past Medical History:  Diagnosis Date  . Acute pharyngitis 01/26/2009  . ALLERGIC RHINITIS 08/07/2007  . Allergy   . ARTHRITIS 08/07/2007  . BACK PAIN 09/15/2009  . BENIGN PROSTATIC HYPERTROPHY 05/20/2008  . Cataract   . EATING DISORDER, HX OF 08/07/2007  . ERECTILE DYSFUNCTION 02/18/2008  . HYPERLIPIDEMIA 08/07/2007  . HYPERTENSION 08/07/2007  . NECK PAIN, RIGHT 09/15/2009  . 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 SINUSITIS- ACUTE-NOS 04/30/2010  . Sleep apnea    Past Surgical History:  Procedure Laterality Date  . GASTRIC BYPASS  2006  . Left foot surgery  2008  . Lower back  2004  . Right knee arthroscopy  1994    reports that  has never smoked. he has never used smokeless tobacco. He reports that he drinks alcohol. He reports that he does not use drugs. family history includes Heart disease in his father; Hyperlipidemia in his father; Hypertension in his father. Allergies  Allergen Reactions  . Ace Inhibitors     REACTION: tongue swelling   Current Outpatient  Medications on File Prior to Visit  Medication Sig Dispense Refill  . acetaminophen (TYLENOL ARTHRITIS PAIN) 650 MG CR tablet Take 500 mg by mouth every 8 (eight) hours as needed.     Marland Kitchen amLODipine (NORVASC) 5 MG tablet TAKE 1 TABLET BY MOUTH DAILY 90 tablet 3  . aspirin 81 MG tablet Take 81 mg by mouth daily.      Marland Kitchen CIALIS 20 MG tablet TAKE 1 TABLET BY MOUTH DAILY AS NEEDED. 10 tablet 5  . cyclobenzaprine (FLEXERIL) 5 MG tablet TAKE 1 TABLET (5 MG TOTAL) BY MOUTH 3 (THREE) TIMES DAILY AS NEEDED FOR MUSCLE SPASMS. 60 tablet 1  . desloratadine (CLARINEX) 5 MG tablet Take 1 tablet (5 mg total) by mouth daily. 90 tablet 3  . Diclofenac Sodium (PENNSAID) 2 % SOLN Place 2 application onto the skin 2 (two) times daily. 112 g 3  . flintstones complete (FLINTSTONES) 60 MG chewable tablet Chew 1 tablet by mouth daily.      . furosemide (LASIX) 20 MG tablet Take 1 tablet (20 mg total) by mouth daily. 90 tablet 3  . irbesartan (AVAPRO) 150 MG tablet Take 1 tablet (150 mg total) by mouth daily. 90 tablet 3  . Misc Natural Products (TART CHERRY ADVANCED PO) Take by mouth.    . Omega-3 Krill Oil 500 MG CAPS Take 1 capsule by mouth daily.    Marland Kitchen [place]  Capsule one time a day.    . sildenafil (VIAGRA) 100 MG tablet Take 0.5-1 tablets (50-100 mg total) by mouth daily as needed for erectile dysfunction. 5 tablet 11  . silodosin (RAPAFLO) 4 MG CAPS capsule Take 1 capsule (4 mg total) by mouth daily with breakfast. 60 capsule 11  . traMADol (ULTRAM) 50 MG tablet TAKE 1 TABLET BY MOUTH EVERY 6 HOURS AS NEEDED FOR MODERATE PAIN 120 tablet 2  . valsartan (DIOVAN) 160 MG tablet Take 160 mg by mouth daily.  3  . Vitamin D, Ergocalciferol, (DRISDOL) 50000 units CAPS capsule TAKE 1 CAPSULE (50,000 UNITS TOTAL) BY MOUTH WEEKLY. 12 capsule 0   No current facility-administered medications on file prior to visit.    Review of Systems  Constitutional: Negative for other unusual diaphoresis or  sweats HENT: Negative for ear discharge or swelling Eyes: Negative for other worsening visual disturbances Respiratory: Negative for stridor or other swelling  Gastrointestinal: Negative for worsening distension or other blood Genitourinary: Negative for retention or other urinary change Musculoskeletal: Negative for other MSK pain or swelling Skin: Negative for color change or other new lesions Neurological: Negative for worsening tremors and other numbness  Psychiatric/Behavioral: Negative for worsening agitation or other fatigue All other system neg per pt    Objective:   Physical Exam BP 136/82   Pulse 87   Temp 98.4 F (36.9 C) (Oral)   Ht 5\' 11"  (1.803 m)   Wt 283 lb (128.4 kg)   SpO2 96%   BMI 39.47 kg/m  VS noted,  Constitutional: Pt appears in NAD HENT: Head: NCAT.  Right Ear: External ear normal.  Left Ear: External ear normal.  Eyes: . Pupils are equal, round, and reactive to light. Conjunctivae and EOM are normal Nose: without d/c or deformity Neck: Neck supple. Gross normal ROM Cardiovascular: Normal rate and regular rhythm.   Pulmonary/Chest: Effort normal and breath sounds without rales or wheezing.  Neurological: Pt is alert. At baseline orientation, motor grossly intact Skin: Skin is warm. No rashes, other new lesions, no LE edema Psychiatric: Pt behavior is normal without agitation , + dysphoric mood and affect No other exam findings Lab Results  Component Value Date   WBC 6.4 08/30/2016   HGB 13.6 08/30/2016   HCT 41.7 08/30/2016   PLT 218.0 08/30/2016   GLUCOSE 93 08/30/2016   CHOL 213 (H) 08/30/2016   TRIG 79.0 08/30/2016   HDL 55.10 08/30/2016   LDLDIRECT 157.6 01/02/2009   LDLCALC 142 (H) 08/30/2016   ALT 14 08/30/2016   AST 18 08/30/2016   NA 139 08/30/2016   K 4.3 08/30/2016   CL 103 08/30/2016   CREATININE 0.87 08/30/2016   BUN 16 08/30/2016   CO2 29 08/30/2016   TSH 0.97 08/30/2016   PSA 0.88 08/30/2016   HGBA1C 6.3 08/30/2016        Assessment & Plan:

## 2017-04-06 NOTE — Assessment & Plan Note (Signed)
stable overall by history and exam, recent data reviewed with pt, and pt to restart crestor,  to f/u any worsening symptoms or concerns Lab Results  Component Value Date   LDLCALC 142 (H) 08/30/2016

## 2017-04-06 NOTE — Assessment & Plan Note (Signed)
Asympt,  Lab Results  Component Value Date   HGBA1C 6.3 08/30/2016  stable overall by history and exam, recent data reviewed with pt, and pt to continue medical treatment as before,  to f/u any worsening symptoms or concerns

## 2017-04-06 NOTE — Assessment & Plan Note (Signed)
Denies worsening depression, SI or HI, declines med tx or referral, but may consider family counseling (plans to call himself)

## 2017-04-06 NOTE — Patient Instructions (Addendum)
Please continue all other medications as before, and refills have been done if requested - including the crestor  Please have the pharmacy call with any other refills you may need.  Please continue your efforts at being more active, low cholesterol diet, and weight control.  Please keep your appointments with your specialists as you may have planned  Please call if you would need the referral for Family Counseling  Please return in 6 months, or sooner if needed, with Lab testing done 3-5 days before

## 2017-04-23 ENCOUNTER — Encounter: Payer: Self-pay | Admitting: Internal Medicine

## 2017-04-24 MED ORDER — ZOSTER VAC RECOMB ADJUVANTED 50 MCG/0.5ML IM SUSR: 0.5000 mL | Freq: Once | INTRAMUSCULAR | 1 refills | Status: AC

## 2017-06-16 ENCOUNTER — Other Ambulatory Visit: Payer: Self-pay | Admitting: Family Medicine

## 2017-08-29 ENCOUNTER — Other Ambulatory Visit: Payer: Self-pay | Admitting: Internal Medicine

## 2017-09-14 ENCOUNTER — Other Ambulatory Visit: Payer: Self-pay | Admitting: Family Medicine

## 2017-09-19 ENCOUNTER — Other Ambulatory Visit: Payer: Self-pay | Admitting: Internal Medicine

## 2017-10-04 ENCOUNTER — Ambulatory Visit (INDEPENDENT_AMBULATORY_CARE_PROVIDER_SITE_OTHER): Payer: Managed Care, Other (non HMO) | Admitting: Internal Medicine

## 2017-10-04 ENCOUNTER — Encounter

## 2017-10-04 ENCOUNTER — Encounter: Payer: Self-pay | Admitting: Internal Medicine

## 2017-10-04 ENCOUNTER — Other Ambulatory Visit (INDEPENDENT_AMBULATORY_CARE_PROVIDER_SITE_OTHER): Payer: Managed Care, Other (non HMO)

## 2017-10-04 VITALS — BP 118/76 | HR 86 | Temp 98.5°F | Ht 71.0 in | Wt 284.0 lb

## 2017-10-04 DIAGNOSIS — Z23 Encounter for immunization: Secondary | ICD-10-CM

## 2017-10-04 DIAGNOSIS — I10 Essential (primary) hypertension: Secondary | ICD-10-CM

## 2017-10-04 DIAGNOSIS — Z0000 Encounter for general adult medical examination without abnormal findings: Secondary | ICD-10-CM | POA: Diagnosis not present

## 2017-10-04 DIAGNOSIS — R739 Hyperglycemia, unspecified: Secondary | ICD-10-CM

## 2017-10-04 LAB — URINALYSIS, ROUTINE W REFLEX MICROSCOPIC
Hgb urine dipstick: NEGATIVE
KETONES UR: NEGATIVE
Leukocytes, UA: NEGATIVE
NITRITE: NEGATIVE
RBC / HPF: NONE SEEN (ref 0–?)
Specific Gravity, Urine: >=1.03 — AB (ref 1.000–1.030)
Total Protein, Urine: NEGATIVE
URINE GLUCOSE: NEGATIVE
Urobilinogen, UA: 1 (ref 0.0–1.0)
pH: 5.5 (ref 5.0–8.0)

## 2017-10-04 LAB — BASIC METABOLIC PANEL
BUN: 18 mg/dL (ref 6–23)
CALCIUM: 9.3 mg/dL (ref 8.4–10.5)
CO2: 31 mEq/L (ref 19–32)
Chloride: 107 mEq/L (ref 96–112)
Creatinine, Ser: 0.89 mg/dL (ref 0.40–1.50)
GFR: 109.74 mL/min (ref >60.00)
GLUCOSE: 95 mg/dL (ref 70–99)
POTASSIUM: 4.3 meq/L (ref 3.5–5.1)
SODIUM: 142 meq/L (ref 135–145)

## 2017-10-04 LAB — HEPATIC FUNCTION PANEL
ALK PHOS: 60 U/L (ref 39–117)
ALT: 15 U/L (ref 0–53)
AST: 17 U/L (ref 0–37)
Albumin: 3.9 g/dL (ref 3.5–5.2)
BILIRUBIN DIRECT: 0.1 mg/dL (ref 0.0–0.3)
TOTAL PROTEIN: 6.2 g/dL (ref 6.0–8.3)
Total Bilirubin: 0.5 mg/dL (ref 0.2–1.2)

## 2017-10-04 LAB — CBC WITH DIFFERENTIAL/PLATELET
BASOS PCT: 1 % (ref 0.0–3.0)
Basophils Absolute: 0.1 10*3/uL (ref 0.0–0.1)
EOS ABS: 0.1 10*3/uL (ref 0.0–0.7)
Eosinophils Relative: 2.1 % (ref 0.0–5.0)
HCT: 37.6 % — ABNORMAL LOW (ref 39.0–52.0)
HEMOGLOBIN: 12.2 g/dL — AB (ref 13.0–17.0)
LYMPHS ABS: 1 10*3/uL (ref 0.7–4.0)
Lymphocytes Relative: 16.7 % (ref 12.0–46.0)
MCHC: 32.4 g/dL (ref 30.0–36.0)
MCV: 82.5 fl (ref 78.0–100.0)
MONO ABS: 0.6 10*3/uL (ref 0.1–1.0)
Monocytes Relative: 9.6 % (ref 3.0–12.0)
NEUTROS PCT: 70.6 % (ref 43.0–77.0)
Neutro Abs: 4.2 10*3/uL (ref 1.4–7.7)
Platelets: 198 10*3/uL (ref 150.0–400.0)
RBC: 4.55 Mil/uL (ref 4.22–5.81)
RDW: 15.7 % — AB (ref 11.5–15.5)
WBC: 6 10*3/uL (ref 4.0–10.5)

## 2017-10-04 LAB — LIPID PANEL
Cholesterol: 132 mg/dL (ref 0–200)
HDL: 50.3 mg/dL (ref >39.00)
LDL Cholesterol: 70 mg/dL (ref 0–99)
NONHDL: 81.41
TRIGLYCERIDES: 59 mg/dL (ref 0.0–149.0)
Total CHOL/HDL Ratio: 3
VLDL: 11.8 mg/dL (ref 0.0–40.0)

## 2017-10-04 LAB — HEMOGLOBIN A1C: Hgb A1c MFr Bld: 6.2 % (ref 4.6–6.5)

## 2017-10-04 LAB — TSH: TSH: 0.77 u[IU]/mL (ref 0.35–4.50)

## 2017-10-04 LAB — PSA: PSA: 1.26 ng/mL (ref 0.10–4.00)

## 2017-10-04 NOTE — Assessment & Plan Note (Signed)
stable overall by history and exam, recent data reviewed with pt, and pt to continue medical treatment as before,  to f/u any worsening symptoms or concerns, for a1c with labs 

## 2017-10-04 NOTE — Assessment & Plan Note (Signed)

## 2017-10-04 NOTE — Assessment & Plan Note (Signed)
stable overall by history and exam, recent data reviewed with pt, and pt to continue medical treatment as before,  to f/u any worsening symptoms or concerns  

## 2017-10-04 NOTE — Patient Instructions (Addendum)
You had the Pneumovax shot today  Please continue all other medications as before, and refills have been done if requested.  Please have the pharmacy call with any other refills you may need.  Please continue your efforts at being more active, low cholesterol diet, and weight control.  You are otherwise up to date with prevention measures today.  Please keep your appointments with your specialists as you may have planned  Please go to the LAB in the Basement (turn left off the elevator) for the tests to be done today  You will be contacted by phone if any changes need to be made immediately.  Otherwise, you will receive a letter about your results with an explanation, but please check with MyChart first.  Please remember to sign up for MyChart if you have not done so, as this will be important to you in the future with finding out test results, communicating by private email, and scheduling acute appointments online when needed.  Please return in 6 months, or sooner if needed 

## 2017-10-04 NOTE — Addendum Note (Signed)
Addended by: Juliet Rude on: 10/04/2017 03:03 PM   Modules accepted: Orders

## 2017-10-04 NOTE — Progress Notes (Signed)
Subjective:    Patient ID: Joshua Ishihara Sr., male    DOB: [birth year], 67 y.o.   MRN: [id number]  HPI    Here for wellness and f/u;  Overall doing ok;  Pt denies Chest pain, worsening SOB, DOE, wheezing, orthopnea, PND, worsening LE edema, palpitations, dizziness or syncope.  Pt denies neurological change such as new headache, facial or extremity weakness.  Pt denies polydipsia, polyuria, or low sugar symptoms. Pt states overall good compliance with treatment and medications, good tolerability, and has been trying to follow appropriate diet.  Pt denies worsening depressive symptoms, suicidal ideation or panic. No fever, night sweats, wt loss, loss of appetite, or other constitutional symptoms.  Pt states good ability with ADL's, has low fall risk, home safety reviewed and adequate, no other significant changes in hearing or vision, and only occasionally active with exercise.   BP Readings from Last 3 Encounters:  10/04/17 118/76  04/06/17 136/82  12/14/16 (!) 141/99   Wt Readings from Last 3 Encounters:  10/04/17 284 lb (128.8 kg)  04/06/17 283 lb (128.4 kg)  12/14/16 284 lb (128.8 kg)  Only c/o bilat ankles and knees, sometimes hips and back with intermittent pain, mild to mod, sharp and stiff, better to sit or be still unti l the stiffness kicks in and that part better with movement.  Works keeps suggesting he retire.  Has intermittent LLE numbness with the back pain but none now.  No giveaways or falls. Takes ultram, 2 alleve and 2 tylenol and works for most of the days.     Past Medical History:  Diagnosis Date  . Acute pharyngitis 01/26/2009  . ALLERGIC RHINITIS 08/07/2007  . Allergy   . ARTHRITIS 08/07/2007  . BACK PAIN 09/15/2009  . BENIGN PROSTATIC HYPERTROPHY 05/20/2008  . Cataract   . EATING DISORDER, HX OF 08/07/2007  . ERECTILE DYSFUNCTION 02/18/2008  . HYPERLIPIDEMIA 08/07/2007  . HYPERTENSION 08/07/2007  . NECK PAIN, RIGHT 09/15/2009  . 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 SINUSITIS- ACUTE-NOS 04/30/2010  . Sleep apnea    Past Surgical History:  Procedure Laterality Date  . GASTRIC BYPASS  2006  . Left foot surgery  2008  . Lower back  2004  . Right knee arthroscopy  1994    reports that he has never smoked. He has never used smokeless tobacco. He reports that he drinks alcohol. He reports that he does not use drugs. family history includes Heart disease in his father; Hyperlipidemia in his father; Hypertension in his father. Allergies  Allergen Reactions  . Ace Inhibitors     REACTION: tongue swelling   Current Outpatient Medications on File Prior to Visit  Medication Sig Dispense Refill  . acetaminophen (TYLENOL ARTHRITIS PAIN) 650 MG CR tablet Take 500 mg by mouth every 8 (eight) hours as needed.     Marland Kitchen amLODipine (NORVASC) 5 MG tablet TAKE 1 TABLET BY MOUTH DAILY 90 tablet 3  . aspirin 81 MG tablet Take 81 mg by mouth daily.      Marland Kitchen CIALIS 20 MG tablet TAKE 1 TABLET BY MOUTH DAILY AS NEEDED. 10 tablet 5  . cyclobenzaprine (FLEXERIL) 5 MG tablet TAKE 1 TABLET (5 MG TOTAL) BY MOUTH 3 (THREE) TIMES DAILY AS NEEDED FOR MUSCLE SPASMS. 60 tablet 1  . desloratadine (CLARINEX) 5 MG tablet TAKE 1 TABLET BY MOUTH EVERY DAY 90 tablet 1  . Diclofenac Sodium (PENNSAID) 2 % SOLN Place 2 application onto the skin 2 (two) times daily. [place]  g 3  . flintstones complete (FLINTSTONES) 60 MG chewable tablet Chew 1 tablet by mouth daily.      . furosemide (LASIX) 20 MG tablet TAKE 1 TABLET BY MOUTH EVERY DAY 90 tablet 1  . irbesartan (AVAPRO) 150 MG tablet Take 1 tablet (150 mg total) by mouth daily. 90 tablet 3  . Misc Natural Products (TART CHERRY ADVANCED PO) Take by mouth.    . Omega-3 Krill Oil 500 MG CAPS Take 1 capsule by mouth daily.    Marland Kitchen OVER THE COUNTER MEDICATION Black Cherry Capsule one time a day.    . rosuvastatin (CRESTOR) 20 MG tablet Take 1 tablet (20 mg total) by mouth daily. 90 tablet 3  . sildenafil (VIAGRA) 100 MG tablet Take 0.5-1 tablets (50-100  mg total) by mouth daily as needed for erectile dysfunction. 5 tablet 11  . silodosin (RAPAFLO) 4 MG CAPS capsule Take 1 capsule (4 mg total) by mouth daily with breakfast. 60 capsule 11  . traMADol (ULTRAM) 50 MG tablet TAKE 1 TABLET BY MOUTH EVERY 6 HOURS AS NEEDED FOR MODERATE PAIN 120 tablet 2  . valsartan (DIOVAN) 160 MG tablet Take 160 mg by mouth daily.  3  . Vitamin D, Ergocalciferol, (DRISDOL) 50000 units CAPS capsule TAKE 1 CAPSULE (50,000 UNITS TOTAL) BY MOUTH WEEKLY. 12 capsule 0   No current facility-administered medications on file prior to visit.    Review of Systems Constitutional: Negative for other unusual diaphoresis, sweats, appetite or weight changes HENT: Negative for other worsening hearing loss, ear pain, facial swelling, mouth sores or neck stiffness.   Eyes: Negative for other worsening pain, redness or other visual disturbance.  Respiratory: Negative for other stridor or swelling Cardiovascular: Negative for other palpitations or other chest pain  Gastrointestinal: Negative for worsening diarrhea or loose stools, blood in stool, distention or other pain Genitourinary: Negative for hematuria, flank pain or other change in urine volume.  Musculoskeletal: Negative for myalgias or other joint swelling.  Skin: Negative for other color change, or other wound or worsening drainage.  Neurological: Negative for other syncope or numbness. Hematological: Negative for other adenopathy or swelling Psychiatric/Behavioral: Negative for hallucinations, other worsening agitation, SI, self-injury, or new decreased concentration All other system neg per pt    Objective:   Physical Exam BP 118/76   Pulse 86   Temp 98.5 F (36.9 C) (Oral)   Ht 5\' 11"  (1.803 m)   Wt 284 lb (128.8 kg)   SpO2 98%   BMI 39.61 kg/m  VS noted, obese Constitutional: Pt is oriented to person, place, and time. Appears well-developed and well-nourished, in no significant distress and comfortable Head:  Normocephalic and atraumatic  Eyes: Conjunctivae and EOM are normal. Pupils are equal, round, and reactive to light Right Ear: External ear normal without discharge Left Ear: External ear normal without discharge Nose: Nose without discharge or deformity Mouth/Throat: Oropharynx is without other ulcerations and moist  Neck: Normal range of motion. Neck supple. No JVD present. No tracheal deviation present or significant neck LA or mass Cardiovascular: Normal rate, regular rhythm, normal heart sounds and intact distal pulses.   Pulmonary/Chest: WOB normal and breath sounds without rales or wheezing  Abdominal: Soft. Bowel sounds are normal. NT. No HSM  Musculoskeletal: Normal range of motion. Exhibits chrnonic 1+ edema Lymphadenopathy: Has no other cervical adenopathy.  Neurological: Pt is alert and oriented to person, place, and time. Pt has normal reflexes. No cranial nerve deficit. Motor grossly intact, Gait intact Skin: Skin  is warm and dry. No rash noted or new ulcerations Psychiatric:  Has normal mood and affect. Behavior is normal without agitation No other exam findings Lab Results  Component Value Date   WBC 6.4 08/30/2016   HGB 13.6 08/30/2016   HCT 41.7 08/30/2016   PLT 218.0 08/30/2016   GLUCOSE 93 08/30/2016   CHOL 213 (H) 08/30/2016   TRIG 79.0 08/30/2016   HDL 55.10 08/30/2016   LDLDIRECT 157.6 01/02/2009   LDLCALC 142 (H) 08/30/2016   ALT 14 08/30/2016   AST 18 08/30/2016   NA 139 08/30/2016   K 4.3 08/30/2016   CL 103 08/30/2016   CREATININE 0.87 08/30/2016   BUN 16 08/30/2016   CO2 29 08/30/2016   TSH 0.97 08/30/2016   PSA 0.88 08/30/2016   HGBA1C 6.3 08/30/2016       Assessment & Plan:

## 2017-10-10 ENCOUNTER — Other Ambulatory Visit: Payer: Self-pay | Admitting: Internal Medicine

## 2017-10-23 ENCOUNTER — Other Ambulatory Visit: Payer: Self-pay | Admitting: Internal Medicine

## 2017-10-24 NOTE — Telephone Encounter (Signed)
Done erx 

## 2017-10-24 NOTE — Telephone Encounter (Signed)
   LOV: 10/04/17 NextOV: 04/09/18 Last Filled/Quantity: 05/26/17 120#

## 2017-11-20 ENCOUNTER — Encounter: Payer: Self-pay | Admitting: Internal Medicine

## 2017-11-24 ENCOUNTER — Encounter: Payer: Self-pay | Admitting: Physician Assistant

## 2017-11-24 ENCOUNTER — Ambulatory Visit (INDEPENDENT_AMBULATORY_CARE_PROVIDER_SITE_OTHER): Payer: Managed Care, Other (non HMO) | Admitting: Physician Assistant

## 2017-11-24 VITALS — BP 150/80 | HR 72 | Ht 71.0 in | Wt 281.0 lb

## 2017-11-24 DIAGNOSIS — R112 Nausea with vomiting, unspecified: Secondary | ICD-10-CM

## 2017-11-24 DIAGNOSIS — R1013 Epigastric pain: Secondary | ICD-10-CM | POA: Diagnosis not present

## 2017-11-24 DIAGNOSIS — R1011 Right upper quadrant pain: Secondary | ICD-10-CM | POA: Diagnosis not present

## 2017-11-24 MED ORDER — PANTOPRAZOLE SODIUM 40 MG PO TBEC: 40.0000 mg | DELAYED_RELEASE_TABLET | Freq: Two times a day (BID) | ORAL | 3 refills | Status: DC

## 2017-11-24 NOTE — Patient Instructions (Signed)
We have sent the following medications to your pharmacy for you to pick up at your convenience: Pantoprazole 40 mg twice a day 30-60 minutes before breakfast and dinner.   You have been scheduled for an endoscopy. Please follow written instructions given to you at your visit today. If you use inhalers (even only as needed), please bring them with you on the day of your procedure. Your physician has requested that you go to www.startemmi.com and enter the access code given to you at your visit today. This web site gives a general overview about your procedure. However, you should still follow specific instructions given to you by our office regarding your preparation for the procedure.

## 2017-11-24 NOTE — Progress Notes (Signed)
Chief Complaint: Nausea, vomiting and abdominal pain  HPI:    Joshua Burnett is a 67 year old male with a past medical history of an eating disorder as well as gastric bypass, as well as others listed below, known to Dr. Havery [name], who was referred to me by Biagio Borg, MD for a complaint of nausea, vomiting and abdominal pain.      12/14/2016 colonoscopy with nonthrombosed external hemorrhoids, one 3 mm polyp in the cecum, one 4 mm polyp in the ascending colon, four 3-7 mm polyps in the transverse colon, diverticulosis in the left and right colon, internal hemorrhoids and suspected anal papillary which were hypertrophied and otherwise normal exam.  Pathology showed tubular adenomas.  Repeat was recommended in 3 years.    10/04/2017 hepatic function panel, BMP,TSH and urinalysis all normal.  CBC did show slight decrease in hemoglobin at 12.2 compared to 13.6 last year.  MCV normal.    Today, patient tells me that back in '99 or 2000 he had a stomach ulcer and had all the same symptoms he is having now.  He describes that on 11/19/2017 he was out for dinner and had spaghetti with a lot of tomato sauce, that night he started with a 9/10 epigastric/right upper quadrant pain as well as nausea, vomiting and a subjective fever.  He then went on an altered diet eating only bland foods over the past week and has stopped using his Aleve which he was taking 2 tabs per day for arthritis pain and also picked up some over-the-counter Prilosec which he has been using once a day.  Has been able to make it through the week but does continue with occasional vomiting as well as nausea and right upper quadrant pain which can sometimes increase to 9/10 later on in the day when the Prilosec is "wearing off".    Denies weight loss, anorexia, melena or symptoms that awaken him from sleep.  Past Medical History:  Diagnosis Date  . Acute pharyngitis 01/26/2009  . ALLERGIC RHINITIS 08/07/2007  . Allergy   . ARTHRITIS 08/07/2007    . BACK PAIN 09/15/2009  . BENIGN PROSTATIC HYPERTROPHY 05/20/2008  . Cataract   . EATING DISORDER, HX OF 08/07/2007  . ERECTILE DYSFUNCTION 02/18/2008  . HYPERLIPIDEMIA 08/07/2007  . HYPERTENSION 08/07/2007  . NECK PAIN, RIGHT 09/15/2009  . 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 SINUSITIS- ACUTE-NOS 04/30/2010  . Sleep apnea     Past Surgical History:  Procedure Laterality Date  . GASTRIC BYPASS  2006  . Left foot surgery  2008  . Lower back  2004  . Right knee arthroscopy  1994    Current Outpatient Medications  Medication Sig Dispense Refill  . acetaminophen (TYLENOL ARTHRITIS PAIN) 650 MG CR tablet Take 500 mg by mouth every 8 (eight) hours as needed.     Marland Kitchen amLODipine (NORVASC) 5 MG tablet TAKE 1 TABLET BY MOUTH DAILY 90 tablet 3  . aspirin 81 MG tablet Take 81 mg by mouth daily.      Marland Kitchen CIALIS 20 MG tablet TAKE 1 TABLET BY MOUTH DAILY AS NEEDED. 10 tablet 5  . cyclobenzaprine (FLEXERIL) 5 MG tablet TAKE 1 TABLET (5 MG TOTAL) BY MOUTH 3 (THREE) TIMES DAILY AS NEEDED FOR MUSCLE SPASMS. 60 tablet 1  . desloratadine (CLARINEX) 5 MG tablet TAKE 1 TABLET BY MOUTH EVERY DAY 90 tablet 1  . Diclofenac Sodium (PENNSAID) 2 % SOLN Place 2 application onto the skin 2 (two) times daily. 112 g 3  .  flintstones complete (FLINTSTONES) 60 MG chewable tablet Chew 1 tablet by mouth daily.      . furosemide (LASIX) 20 MG tablet TAKE 1 TABLET BY MOUTH EVERY DAY 90 tablet 1  . irbesartan (AVAPRO) 150 MG tablet TAKE 1 TABLET BY MOUTH EVERY DAY 90 tablet 3  . Misc Natural Products (TART CHERRY ADVANCED PO) Take by mouth.    . Omega-3 Krill Oil 500 MG CAPS Take 1 capsule by mouth daily.    Marland Kitchen omeprazole (PRILOSEC) 10 MG capsule Take 10 mg by mouth daily.    Marland Kitchen OVER THE COUNTER MEDICATION Black Cherry Capsule one time a day.    . rosuvastatin (CRESTOR) 20 MG tablet Take 1 tablet (20 mg total) by mouth daily. 90 tablet 3  . sildenafil (VIAGRA) 100 MG tablet Take 0.5-1 tablets (50-100 mg total) by mouth  daily as needed for erectile dysfunction. 5 tablet 11  . silodosin (RAPAFLO) 4 MG CAPS capsule Take 1 capsule (4 mg total) by mouth daily with breakfast. 60 capsule 11  . traMADol (ULTRAM) 50 MG tablet TAKE 1 TABLET BY MOUTH EVERY 6 HOURS AS NEEDED MODERATE PAIN 120 tablet 2  . valsartan (DIOVAN) 160 MG tablet Take 160 mg by mouth daily.  3  . Vitamin D, Ergocalciferol, (DRISDOL) 50000 units CAPS capsule TAKE 1 CAPSULE (50,000 UNITS TOTAL) BY MOUTH WEEKLY. 12 capsule 0   No current facility-administered medications for this visit.     Allergies as of 11/24/2017 - Review Complete 11/24/2017  Allergen Reaction Noted  . Ace inhibitors  08/07/2007    Family History  Problem Relation Age of Onset  . Hyperlipidemia Father   . Heart disease Father   . Hypertension Father   . Colon polyps Neg Hx   . Esophageal cancer Neg Hx   . Rectal cancer Neg Hx   . Stomach cancer Neg Hx   . Pancreatic cancer Neg Hx     Social History   Socioeconomic History  . Marital status: Married    Spouse name: Not on file  . Number of children: Not on file  . Years of education: Not on file  . Highest education level: Not on file  Occupational History  . Not on file  Social Needs  . Financial resource strain: Not on file  . Food insecurity:    Worry: Not on file    Inability: Not on file  . Transportation needs:    Medical: Not on file    Non-medical: Not on file  Tobacco Use  . Smoking status: Never Smoker  . Smokeless tobacco: Never Used  Substance and Sexual Activity  . Alcohol use: Yes    Comment: occasional beer   . Drug use: No  . Sexual activity: Not on file  Lifestyle  . Physical activity:    Days per week: Not on file    Minutes per session: Not on file  . Stress: Not on file  Relationships  . Social connections:    Talks on phone: Not on file    Gets together: Not on file    Attends religious service: Not on file    Active member of club or organization: Not on file     Attends meetings of clubs or organizations: Not on file    Relationship status: Not on file  . Intimate partner violence:    Fear of current or ex partner: Not on file    Emotionally abused: Not on file    Physically abused: Not  on file    Forced sexual activity: Not on file  Other Topics Concern  . Not on file  Social History Narrative  . Not on file    Review of Systems:    Constitutional: No weight loss, fever or chills Cardiovascular: No chest pain Respiratory: No SOB  Gastrointestinal: See HPI and otherwise negative   Physical Exam:  Vital signs: BP (!) 150/80   Pulse 72   Ht 5\' 11"  (1.803 m)   Wt 281 lb (127.5 kg)   BMI 39.19 kg/m   Constitutional:   Pleasant overweight AA male appears to be in NAD, Well developed, Well nourished, alert and cooperative Respiratory: Respirations even and unlabored. Lungs clear to auscultation bilaterally.   No wheezes, crackles, or rhonchi.  Cardiovascular: Normal S1, S2. No MRG. Regular rate and rhythm. No peripheral edema, cyanosis or pallor.  Gastrointestinal:  Soft, nondistended, Mild epigastric and RUQ ttp, No rebound or guarding. Normal bowel sounds. No appreciable masses or hepatomegaly. Rectal:  Not performed.  Psychiatric: Demonstrates good judgement and reason without abnormal affect or behaviors.  MOST RECENT LABS AND IMAGING: CBC    Component Value Date/Time   WBC 6.0 10/04/2017 1506   RBC 4.55 10/04/2017 1506   HGB 12.2 (L) 10/04/2017 1506   HCT 37.6 (L) 10/04/2017 1506   PLT 198.0 10/04/2017 1506   MCV 82.5 10/04/2017 1506   MCHC 32.4 10/04/2017 1506   RDW 15.7 (H) 10/04/2017 1506   LYMPHSABS 1.0 10/04/2017 1506   MONOABS 0.6 10/04/2017 1506   EOSABS 0.1 10/04/2017 1506   BASOSABS 0.1 10/04/2017 1506    CMP     Component Value Date/Time   NA 142 10/04/2017 1506   K 4.3 10/04/2017 1506   CL 107 10/04/2017 1506   CO2 31 10/04/2017 1506   GLUCOSE 95 10/04/2017 1506   BUN 18 10/04/2017 1506   CREATININE 0.89  10/04/2017 1506   CALCIUM 9.3 10/04/2017 1506   PROT 6.2 10/04/2017 1506   ALBUMIN 3.9 10/04/2017 1506   AST 17 10/04/2017 1506   ALT 15 10/04/2017 1506   ALKPHOS 60 10/04/2017 1506   BILITOT 0.5 10/04/2017 1506   GFRNONAA 146.08 12/11/2009 0755    Assessment: 1.  Epigastric/right upper quadrant pain: Started acutely a week ago, some better on Prilosec 20 mg daily, also with nausea, vomiting and subjective fever, patient compares these exact symptoms to previous episode of ulcer disease 2.  Nausea and vomiting: Over the past week after onset of acute epigastric/right upper quadrant pain; Consider PUD versus gallbladder etiology versus other  Plan: 1.  Scheduled patient for a diagnostic EGD in the [facility] with Dr. Tarri Glenn as she had availability within the next week.  Discussed risk, benefits, limitations and alternatives and the patient agrees to proceed.  After time of procedure patient will continue to follow with his primary GI physician Dr. Havery [name]. 2.  Prescribed Pantoprazole 40 mg twice daily, 30-60 minutes before breakfast and dinner #60 with 2 refills 3.  Reviewed antireflux diet and lifestyle modifications. 4.  Patient to discontinue NSAID use 5.  Patient can remain on a bland diet for now until after time of EGD. 6.  Patient to follow in clinic per recommendations from Dr. Tarri Glenn after time of procedure.  Ellouise Newer, PA-C [facility] 11/24/2017, 10:20 AM  Cc: Biagio Borg, MD

## 2017-11-24 NOTE — Progress Notes (Signed)
Agree with assessment and plan as outlined.  

## 2017-11-27 NOTE — Progress Notes (Signed)
I agree with the plan documented above.

## 2017-11-28 ENCOUNTER — Encounter: Payer: Self-pay | Admitting: Gastroenterology

## 2017-11-28 ENCOUNTER — Ambulatory Visit (AMBULATORY_SURGERY_CENTER): Payer: Managed Care, Other (non HMO) | Admitting: Gastroenterology

## 2017-11-28 VITALS — BP 116/69 | HR 66 | Temp 98.4°F | Resp 17 | Ht 71.0 in | Wt 281.0 lb

## 2017-11-28 DIAGNOSIS — K2990 Gastroduodenitis, unspecified, without bleeding: Secondary | ICD-10-CM | POA: Diagnosis not present

## 2017-11-28 DIAGNOSIS — R112 Nausea with vomiting, unspecified: Secondary | ICD-10-CM | POA: Diagnosis not present

## 2017-11-28 DIAGNOSIS — K2970 Gastritis, unspecified, without bleeding: Secondary | ICD-10-CM | POA: Diagnosis not present

## 2017-11-28 MED ORDER — SODIUM CHLORIDE 0.9 % IV SOLN: 500.0000 mL | Freq: Once | INTRAVENOUS | Status: DC

## 2017-11-28 NOTE — Progress Notes (Signed)
Called to room to assist during endoscopic procedure.  Patient ID and intended procedure confirmed with present staff. Received instructions for my participation in the procedure from the performing physician.  

## 2017-11-28 NOTE — Patient Instructions (Signed)
YOU HAD AN ENDOSCOPIC PROCEDURE TODAY AT [facility]:   Refer to the procedure report that was given to you for any specific questions about what was found during the examination.  If the procedure report does not answer your questions, please call your gastroenterologist to clarify.  If you requested that your care partner not be given the details of your procedure findings, then the procedure report has been included in a sealed envelope for you to review at your convenience later.  YOU SHOULD EXPECT: Some feelings of bloating in the abdomen. Passage of more gas than usual.  Walking can help get rid of the air that was put into your GI tract during the procedure and reduce the bloating. If you had a lower endoscopy (such as a colonoscopy or flexible sigmoidoscopy) you may notice spotting of blood in your stool or on the toilet paper. If you underwent a bowel prep for your procedure, you may not have a normal bowel movement for a few days.  Please Note:  You might notice some irritation and congestion in your nose or some drainage.  This is from the oxygen used during your procedure.  There is no need for concern and it should clear up in a day or so.  SYMPTOMS TO REPORT IMMEDIATELY:    Following upper endoscopy (EGD)  Vomiting of blood or coffee ground material  New chest pain or pain under the shoulder blades  Painful or persistently difficult swallowing  New shortness of breath  Fever of 100F or higher  Black, tarry-looking stools  For urgent or emergent issues, a gastroenterologist can be reached at any hour by calling [phone].   DIET:  We do recommend a small meal at first, but then you may proceed to your regular diet.  Drink plenty of fluids but you should avoid alcoholic beverages for 24 hours.  ACTIVITY:  You should plan to take it easy for the rest of today and you should NOT DRIVE or use heavy machinery until tomorrow (because of the sedation medicines used  during the test).    FOLLOW UP: Our staff will call the number listed on your records the next business day following your procedure to check on you and address any questions or concerns that you may have regarding the information given to you following your procedure. If we do not reach you, we will leave a message.  However, if you are feeling well and you are not experiencing any problems, there is no need to return our call.  We will assume that you have returned to your regular daily activities without incident.  If any biopsies were taken you will be contacted by phone or by letter within the next 1-3 weeks.  Please call us at [phone] if you have not heard about the biopsies in 3 weeks.    SIGNATURES/CONFIDENTIALITY: You and/or your care partner have signed paperwork which will be entered into your electronic medical record.  These signatures attest to the fact that that the information above on your After Visit Summary has been reviewed and is understood.  Full responsibility of the confidentiality of this discharge information lies with you and/or your care-partner.    Handouts were given to your care partner on a hiatal hernia and gastritis. NO ASPIRIN, ASPIRIN CONTAINING PRODUCTS (BC OR GOODY POWDERS) OR NSAIDS (IBUPROFEN, ADVIL, ALEVE, AND MOTRIN); TYLENOL IS OK TO TAKE and your tramadol is ok to take too. Per Dr. Ardis Hughs continue [place]  twice daily for two months and then decrease to once daily. You may resume your other current medications today. Await biopsy results. Please call if any questions or concerns.

## 2017-11-28 NOTE — Progress Notes (Signed)
No problems noted in the recovery room. maw 

## 2017-11-28 NOTE — Progress Notes (Signed)
Report given to PACU, vss 

## 2017-11-28 NOTE — Op Note (Signed)
[place] Patient Name: Joshua Burnett Procedure Date: 11/28/2017 8:02 AM MRN: [id number] Endoscopist: Milus Banister , MD Age: 67 Referring MD:  Date of Birth: [birth year] Gender: Male Account #: [id number] Procedure:                Upper GI endoscopy Indications:              Generalized abdominal pain Medicines:                Monitored Anesthesia Care Procedure:                Pre-Anesthesia Assessment:                           - Prior to the procedure, a History and Physical                            was performed, and patient medications and                            allergies were reviewed. The patient's tolerance of                            previous anesthesia was also reviewed. The risks                            and benefits of the procedure and the sedation                            options and risks were discussed with the patient.                            All questions were answered, and informed consent                            was obtained. Prior Anticoagulants: The patient has                            taken no previous anticoagulant or antiplatelet                            agents. ASA Grade Assessment: II - A patient with                            mild systemic disease. After reviewing the risks                            and benefits, the patient was deemed in                            satisfactory condition to undergo the procedure.                           After obtaining informed consent, the endoscope was  passed under direct vision. Throughout the                            procedure, the patient's blood pressure, pulse, and                            oxygen saturations were monitored continuously. The                            Endoscope was introduced through the mouth, and                            advanced to the second part of duodenum. The upper                            GI endoscopy was accomplished  without difficulty.                            The patient tolerated the procedure well. 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                           Minor Schatzki's ring.                           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 Both limbs were intubated and appeared                            normal to at least 10-20cm.                           Mild inflammation characterized by friability and                            granularity was found in the gastric remnant.                            Biopsies were taken with a cold forceps for                            histology. Complications:            No immediate complications. Estimated blood loss:                            None. Estimated Blood Loss:     Estimated blood loss: none. Impression:               Small hiatal hernia  with resultant tortuous                            esophagus.                           Minor Schatzki's ring.                           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 Both limbs were intubated and appeared                            normal to at least 10-20cm.                           Mild inflammation in gastric remnant, biospied to                            check for H. pylori. Recommendation:           - Patient has a contact number available for                            emergencies. The signs and symptoms of potential                            delayed complications were discussed with the                            patient. Return to normal activities tomorrow.                            Written discharge instructions were provided to the                            patient.                           - Resume previous diet.                           - Continue present medications.                            - Await pathology results.                           - You need to continue to avoid NSAIDs and aspirin.                           - Continue protonix twice daily for two months and                            then decrease to once  daily. Milus Banister, MD 11/28/2017 8:30:49 AM This report has been signed electronically.

## 2017-11-29 ENCOUNTER — Telehealth: Payer: Self-pay | Admitting: *Deleted

## 2017-11-29 NOTE — Telephone Encounter (Signed)
  Follow up Call-  Call back number 11/28/2017 12/14/2016  Post procedure Call Back phone  # [phone]  [phone]  Permission to leave phone message Yes Yes  Some recent data might be hidden     Patient questions:  Do you have a fever, pain , or abdominal swelling? No. Pain Score  0 *  Have you tolerated food without any problems? Yes.    Have you been able to return to your normal activities? Yes.    Do you have any questions about your discharge instructions: Diet   No. Medications  No. Follow up visit  No.  Do you have questions or concerns about your Care? No.  Actions: * If pain score is 4 or above: No action needed, pain <4.

## 2017-12-05 ENCOUNTER — Telehealth: Payer: Self-pay | Admitting: Gastroenterology

## 2017-12-05 NOTE — Telephone Encounter (Signed)
Left message on machine to call back  

## 2017-12-06 NOTE — Telephone Encounter (Signed)
Appt with Dr Havery [name] on 01/16/18 at 830 am The patient has been notified of this information and all questions answered.

## 2017-12-24 ENCOUNTER — Other Ambulatory Visit: Payer: Self-pay | Admitting: Family Medicine

## 2017-12-25 ENCOUNTER — Other Ambulatory Visit: Payer: Self-pay | Admitting: Internal Medicine

## 2018-01-16 ENCOUNTER — Ambulatory Visit: Payer: Managed Care, Other (non HMO) | Admitting: Gastroenterology

## 2018-01-23 ENCOUNTER — Encounter: Payer: Self-pay | Admitting: Internal Medicine

## 2018-01-23 ENCOUNTER — Ambulatory Visit (INDEPENDENT_AMBULATORY_CARE_PROVIDER_SITE_OTHER): Payer: Managed Care, Other (non HMO) | Admitting: Internal Medicine

## 2018-01-23 VITALS — BP 142/84 | HR 90 | Temp 98.2°F | Ht 71.0 in | Wt 280.0 lb

## 2018-01-23 DIAGNOSIS — R059 Cough, unspecified: Secondary | ICD-10-CM | POA: Insufficient documentation

## 2018-01-23 DIAGNOSIS — R05 Cough: Secondary | ICD-10-CM | POA: Diagnosis not present

## 2018-01-23 DIAGNOSIS — R062 Wheezing: Secondary | ICD-10-CM | POA: Insufficient documentation

## 2018-01-23 DIAGNOSIS — I10 Essential (primary) hypertension: Secondary | ICD-10-CM

## 2018-01-23 MED ORDER — AZITHROMYCIN 250 MG PO TABS: ORAL_TABLET | ORAL | 1 refills | Status: DC

## 2018-01-23 MED ORDER — HYDROCODONE-HOMATROPINE 5-1.5 MG/5ML PO SYRP: 5.0000 mL | ORAL_SOLUTION | Freq: Four times a day (QID) | ORAL | 0 refills | Status: AC | PRN

## 2018-01-23 MED ORDER — PREDNISONE 10 MG PO TABS: ORAL_TABLET | ORAL | 0 refills | Status: DC

## 2018-01-23 NOTE — Assessment & Plan Note (Signed)
Mild, for predpac asd, declines in haler prn  to f/u any worsening symptoms or concerns

## 2018-01-23 NOTE — Patient Instructions (Signed)
Please take all new medication as prescribed - the antibiotic, cough medicine as needed, and prednisone  Please call if you change your mind about the inhaler  Please continue all other medications as before, and refills have been done if requested.  Please have the pharmacy call with any other refills you may need.  Please keep your appointments with your specialists as you may have planned

## 2018-01-23 NOTE — Progress Notes (Signed)
Subjective:    Patient ID: Joshua Ishihara Sr., male    DOB: 07-Dec-1950, 67 y.o.   MRN: [id number]  HPI  Here with acute onset mild to mod 7 days ST, HA, general weakness and malaise, with prod cough greenish sputum, but Pt denies chest pain, increased sob or doe, wheezing, orthopnea, PND, increased LE swelling, palpitations, dizziness or syncope, except for onset mild wheezing and sob x 2 days.  Pt denies new neurological symptoms such as new headache, or facial or extremity weakness or numbness   Pt denies polydipsia, polyuria.   Pt denies wt loss, night sweats, loss of appetite, or other constitutional symptoms except mild lower appetite with illness Past Medical History:  Diagnosis Date  . Acute pharyngitis 01/26/2009  . ALLERGIC RHINITIS 08/07/2007  . Allergy   . ARTHRITIS 08/07/2007  . BACK PAIN 09/15/2009  . BENIGN PROSTATIC HYPERTROPHY 05/20/2008  . Cataract   . EATING DISORDER, HX OF 08/07/2007  . ERECTILE DYSFUNCTION 02/18/2008  . HYPERLIPIDEMIA 08/07/2007  . HYPERTENSION 08/07/2007  . NECK PAIN, RIGHT 09/15/2009  . 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 SINUSITIS- ACUTE-NOS 04/30/2010  . Sleep apnea    Past Surgical History:  Procedure Laterality Date  . GASTRIC BYPASS  2006  . Left foot surgery  2008  . Lower back  2004  . Right knee arthroscopy  1994    reports that he has never smoked. He has never used smokeless tobacco. He reports that he drinks alcohol. He reports that he does not use drugs. family history includes Heart disease in his father; Hyperlipidemia in his father; Hypertension in his father. Allergies  Allergen Reactions  . Ace Inhibitors     REACTION: tongue swelling   Current Outpatient Medications on File Prior to Visit  Medication Sig Dispense Refill  . acetaminophen (TYLENOL ARTHRITIS PAIN) 650 MG CR tablet Take 500 mg by mouth every 8 (eight) hours as needed.     Marland Kitchen amLODipine (NORVASC) 5 MG tablet TAKE 1 TABLET BY MOUTH DAILY 90 tablet 3  .  aspirin 81 MG tablet Take 81 mg by mouth daily.      . cyclobenzaprine (FLEXERIL) 5 MG tablet TAKE 1 TABLET (5 MG TOTAL) BY MOUTH 3 (THREE) TIMES DAILY AS NEEDED FOR MUSCLE SPASMS. 60 tablet 1  . desloratadine (CLARINEX) 5 MG tablet TAKE 1 TABLET BY MOUTH EVERY DAY 90 tablet 1  . Diclofenac Sodium (PENNSAID) 2 % SOLN Place 2 application onto the skin 2 (two) times daily. 112 g 3  . flintstones complete (FLINTSTONES) 60 MG chewable tablet Chew 1 tablet by mouth daily.      . furosemide (LASIX) 20 MG tablet TAKE 1 TABLET BY MOUTH EVERY DAY 90 tablet 1  . irbesartan (AVAPRO) 150 MG tablet TAKE 1 TABLET BY MOUTH EVERY DAY 90 tablet 3  . Misc Natural Products (TART CHERRY ADVANCED PO) Take by mouth.    . Omega-3 Krill Oil 500 MG CAPS Take 1 capsule by mouth daily.    Marland Kitchen omeprazole (PRILOSEC) 10 MG capsule Take 10 mg by mouth daily.    Marland Kitchen OVER THE COUNTER MEDICATION Black Cherry Capsule one time a day.    . pantoprazole (PROTONIX) 40 MG tablet Take 1 tablet (40 mg total) by mouth 2 (two) times daily before a meal. 60 tablet 3  . rosuvastatin (CRESTOR) 20 MG tablet Take 1 tablet (20 mg total) by mouth daily. 90 tablet 3  . sildenafil (VIAGRA) 100 MG tablet Take 0.5-1 tablets (50-100  mg total) by mouth daily as needed for erectile dysfunction. 5 tablet 11  . silodosin (RAPAFLO) 4 MG CAPS capsule Take 1 capsule (4 mg total) by mouth daily with breakfast. 60 capsule 11  . tadalafil (ADCIRCA/CIALIS) 20 MG tablet TAKE 1 TABLET BY MOUTH EVERY DAY AS NEEDED 6 tablet 9  . traMADol (ULTRAM) 50 MG tablet TAKE 1 TABLET BY MOUTH EVERY 6 HOURS AS NEEDED MODERATE PAIN 120 tablet 2  . valsartan (DIOVAN) 160 MG tablet Take 160 mg by mouth daily.  3  . Vitamin D, Ergocalciferol, (DRISDOL) 50000 units CAPS capsule TAKE ONE CAPSULE BY MOUTH WEEKLY 12 capsule 0   No current facility-administered medications on file prior to visit.    Review of Systems  Constitutional: Negative for other unusual diaphoresis or  sweats HENT: Negative for ear discharge or swelling Eyes: Negative for other worsening visual disturbances Respiratory: Negative for stridor or other swelling  Gastrointestinal: Negative for worsening distension or other blood Genitourinary: Negative for retention or other urinary change Musculoskeletal: Negative for other MSK pain or swelling Skin: Negative for color change or other new lesions Neurological: Negative for worsening tremors and other numbness  Psychiatric/Behavioral: Negative for worsening agitation or other fatigue All other system neg per pt    Objective:   Physical Exam BP (!) 142/84   Pulse 90   Temp 98.2 F (36.8 C) (Oral)   Ht 5\' 11"  (1.803 m)   Wt 280 lb (127 kg)   SpO2 91%   BMI 39.05 kg/m  VS noted, mild ill Constitutional: Pt appears in NAD HENT: Head: NCAT.  Right Ear: External ear normal.  Left Ear: External ear normal.  Bilat tm's with mild erythema.  Max sinus areas mild tender.  Pharynx with mild erythema, no exudate Eyes: . Pupils are equal, round, and reactive to light. Conjunctivae and EOM are normal Nose: without d/c or deformity Neck: Neck supple. Gross normal ROM Cardiovascular: Normal rate and regular rhythm.   Pulmonary/Chest: Effort normal and breath sounds decrased without rales but with few bilat wheezing.  Neurological: Pt is alert. At baseline orientation, motor grossly intact Skin: Skin is warm. No rashes, other new lesions, no LE edema Psychiatric: Pt behavior is normal without agitation  No other exam findings Lab Results  Component Value Date   WBC 6.0 10/04/2017   HGB 12.2 (L) 10/04/2017   HCT 37.6 (L) 10/04/2017   PLT 198.0 10/04/2017   GLUCOSE 95 10/04/2017   CHOL 132 10/04/2017   TRIG 59.0 10/04/2017   HDL 50.30 10/04/2017   LDLDIRECT 157.6 01/02/2009   LDLCALC 70 10/04/2017   ALT 15 10/04/2017   AST 17 10/04/2017   NA 142 10/04/2017   K 4.3 10/04/2017   CL 107 10/04/2017   CREATININE 0.89 10/04/2017   BUN 18  10/04/2017   CO2 31 10/04/2017   TSH 0.77 10/04/2017   PSA 1.26 10/04/2017   HGBA1C 6.2 10/04/2017       Assessment & Plan:

## 2018-01-23 NOTE — Assessment & Plan Note (Signed)
Mild to mod, c/w bronchitis vs pna, declines cxr, for antibx course, cough med prn,  to f/u any worsening symptoms or concerns 

## 2018-01-23 NOTE — Assessment & Plan Note (Signed)
Slight eleavated today likely reactive, o/w stable overall by history and exam, recent data reviewed with pt, and pt to continue medical treatment as before,  to f/u any worsening symptoms or concerns

## 2018-02-08 ENCOUNTER — Ambulatory Visit (INDEPENDENT_AMBULATORY_CARE_PROVIDER_SITE_OTHER): Payer: Managed Care, Other (non HMO) | Admitting: Gastroenterology

## 2018-02-08 ENCOUNTER — Encounter: Payer: Self-pay | Admitting: Gastroenterology

## 2018-02-08 VITALS — BP 126/72 | HR 76 | Ht 69.0 in | Wt 283.0 lb

## 2018-02-08 DIAGNOSIS — Z86010 Personal history of colonic polyps: Secondary | ICD-10-CM

## 2018-02-08 DIAGNOSIS — K649 Unspecified hemorrhoids: Secondary | ICD-10-CM | POA: Diagnosis not present

## 2018-02-08 DIAGNOSIS — R1010 Upper abdominal pain, unspecified: Secondary | ICD-10-CM | POA: Diagnosis not present

## 2018-02-08 DIAGNOSIS — R112 Nausea with vomiting, unspecified: Secondary | ICD-10-CM

## 2018-02-08 NOTE — Progress Notes (Signed)
HPI :  67 year old male here for follow-up visit. Is a history of gastric bypass with gastric ulcers very long time ago. He was seen in the office on September 20 by Ellouise Newer. At that time he complained of epigastric discomfort as well as nausea and vomiting. His symptoms occurred in the setting of using Aleve, aspirin, and Tylenol arthritis. He was recommended he avoid all NSAIDs and was started on protonix 40 mg once daily. He underwent an upper endoscopy with Dr. Ardis Hughs on September 24. He had postsurgical anatomy with some mild gastritis, biopsies were negative for H. Pylori. His small intestine was normal.  He reports since he stopped all the NSAIDs and been on the Protonix his symptoms have essentially resolved. He is feeling quite well. He has absolutely no nausea or vomiting. He is eating well. He denies any abdominal pains at all.   He otherwise states he's had some symptoms of hemorrhoids recently. Historically these have not bothered him at all. Last week he had some irritation and swelling, used preparation H and it helped, symptoms resolved. He has no complaints currently, bowel function okay. He inquires about hemorrhoid banding.  EGD 11/28/2017 - small HH, tortous esophagus, Bilroth II versus Roux en Y anatomy - mild gastritis, normal small bowel - biopsies negative  Colonoscopy 12/14/2016 - nonthrombosed external hemorrhoids, one 3 mm polyp in the cecum, one 4 mm polyp in the ascending colon, four 3-7 mm polyps in the transverse colon, diverticulosis in the left and right colon, internal hemorrhoids and suspected anal papillary which were hypertrophied and otherwise normal exam.  Pathology showed tubular adenomas.  Repeat exam recommended in 12/2019.  Past Medical History:  Diagnosis Date  . Acute pharyngitis 01/26/2009  . ALLERGIC RHINITIS 08/07/2007  . Allergy   . ARTHRITIS 08/07/2007  . BACK PAIN 09/15/2009  . BENIGN PROSTATIC HYPERTROPHY 05/20/2008  . Cataract   . EATING  DISORDER, HX OF 08/07/2007  . ERECTILE DYSFUNCTION 02/18/2008  . HYPERLIPIDEMIA 08/07/2007  . HYPERTENSION 08/07/2007  . NECK PAIN, RIGHT 09/15/2009  . 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 SINUSITIS- ACUTE-NOS 04/30/2010  . Sleep apnea      Past Surgical History:  Procedure Laterality Date  . GASTRIC BYPASS  2006  . Left foot surgery  2008  . Lower back  2004  . Right knee arthroscopy  1994   Family History  Problem Relation Age of Onset  . Hyperlipidemia Father   . Heart disease Father   . Hypertension Father   . Colon polyps Neg Hx   . Esophageal cancer Neg Hx   . Rectal cancer Neg Hx   . Stomach cancer Neg Hx   . Pancreatic cancer Neg Hx   . Colon cancer Neg Hx    Social History   Tobacco Use  . Smoking status: Never Smoker  . Smokeless tobacco: Never Used  Substance Use Topics  . Alcohol use: Yes    Comment: occasional beer   . Drug use: No   Current Outpatient Medications  Medication Sig Dispense Refill  . acetaminophen (TYLENOL ARTHRITIS PAIN) 650 MG CR tablet Take 500 mg by mouth every 8 (eight) hours as needed.     Marland Kitchen amLODipine (NORVASC) 5 MG tablet TAKE 1 TABLET BY MOUTH DAILY 90 tablet 3  . azithromycin (ZITHROMAX Z-PAK) 250 MG tablet 2 tab by mouth day 1, then 1 per day 6 tablet 1  . cyclobenzaprine (FLEXERIL) 5 MG tablet TAKE 1 TABLET (5 MG TOTAL) BY MOUTH 3 (  THREE) TIMES DAILY AS NEEDED FOR MUSCLE SPASMS. 60 tablet 1  . desloratadine (CLARINEX) 5 MG tablet TAKE 1 TABLET BY MOUTH EVERY DAY 90 tablet 1  . Diclofenac Sodium (PENNSAID) 2 % SOLN Place 2 application onto the skin 2 (two) times daily. 112 g 3  . flintstones complete (FLINTSTONES) 60 MG chewable tablet Chew 1 tablet by mouth daily.      . furosemide (LASIX) 20 MG tablet TAKE 1 TABLET BY MOUTH EVERY DAY 90 tablet 1  . irbesartan (AVAPRO) 150 MG tablet TAKE 1 TABLET BY MOUTH EVERY DAY 90 tablet 3  . Misc Natural Products (TART CHERRY ADVANCED PO) Take by mouth.    . Omega-3 Krill Oil 500 MG  CAPS Take 1 capsule by mouth daily.    Marland Kitchen OVER THE COUNTER MEDICATION Black Cherry Capsule one time a day.    . pantoprazole (PROTONIX) 40 MG tablet Take 40 mg by mouth daily.    . rosuvastatin (CRESTOR) 20 MG tablet Take 1 tablet (20 mg total) by mouth daily. 90 tablet 3  . sildenafil (VIAGRA) 100 MG tablet Take 0.5-1 tablets (50-100 mg total) by mouth daily as needed for erectile dysfunction. 5 tablet 11  . silodosin (RAPAFLO) 4 MG CAPS capsule Take 1 capsule (4 mg total) by mouth daily with breakfast. 60 capsule 11  . tadalafil (ADCIRCA/CIALIS) 20 MG tablet TAKE 1 TABLET BY MOUTH EVERY DAY AS NEEDED 6 tablet 9  . traMADol (ULTRAM) 50 MG tablet TAKE 1 TABLET BY MOUTH EVERY 6 HOURS AS NEEDED MODERATE PAIN 120 tablet 2  . Turmeric 500 MG CAPS Take by mouth 2 (two) times daily.    . valsartan (DIOVAN) 160 MG tablet Take 160 mg by mouth daily.  3  . Vitamin D, Ergocalciferol, (DRISDOL) 50000 units CAPS capsule TAKE ONE CAPSULE BY MOUTH WEEKLY 12 capsule 0   No current facility-administered medications for this visit.    Allergies  Allergen Reactions  . Ace Inhibitors     REACTION: tongue swelling     Review of Systems: All systems reviewed and negative except where noted in HPI.    Lab Results  Component Value Date   CREATININE 0.89 10/04/2017   BUN 18 10/04/2017   NA 142 10/04/2017   K 4.3 10/04/2017   CL 107 10/04/2017   CO2 31 10/04/2017    Lab Results  Component Value Date   ALT 15 10/04/2017   AST 17 10/04/2017   ALKPHOS 60 10/04/2017   BILITOT 0.5 10/04/2017     Physical Exam: BP 126/72   Pulse 76   Ht 5\' 9"  (1.753 m)   Wt 283 lb (128.4 kg)   BMI 41.79 kg/m  Constitutional: Pleasant,well-developed, male in no acute distress. HEENT: Normocephalic and atraumatic. Conjunctivae are normal. No scleral icterus. Neck supple.  Cardiovascular: Normal rate, regular rhythm.  Pulmonary/chest: Effort normal and breath sounds normal. No wheezing, rales or  rhonchi. Abdominal: Soft, protuberant, nontender.  There are no masses palpable. No hepatomegaly. Extremities: no edema Lymphadenopathy: No cervical adenopathy noted. Neurological: Alert and oriented to person place and time. Skin: Skin is warm and dry. No rashes noted. Psychiatric: Normal mood and affect. Behavior is normal.   ASSESSMENT AND PLAN:  67 year old male here for reassessment following issues:  Nausea / abdominal pain - negative EGD. Symptoms resolved with holding NSAIDs and adding protonix 40mg  daily. Currently asymptomatic. I would use regular tylenol moving forward and avoid further NSAIDs if possible. He will continue PPI at this time.  If he has recurrent symptoms in the future he will contact us. Given his history of gastric bypass would consider capsule form of PPI and crack open prior to ingestion. Given he is doing well will continue present meds for now.  Hemorrhoids - recent symptoms that resolved with conservative therapy. He asks about hemorrhoid banding. I would not recommend it at present given his hemorrhoids have historically not caused any frequent symptoms and his recent symptoms have resolved. Recommend he [place] topical hydrocortisone PRN, would hold off on banding right now unless frequent symptoms moving forward. He agreed.  History of colon adenomas - surveillance colonoscopy due in 2021  [place], MD [facility]

## 2018-02-10 ENCOUNTER — Other Ambulatory Visit: Payer: Self-pay | Admitting: Internal Medicine

## 2018-02-16 ENCOUNTER — Other Ambulatory Visit: Payer: Self-pay | Admitting: Physician Assistant

## 2018-02-18 ENCOUNTER — Other Ambulatory Visit: Payer: Self-pay | Admitting: Internal Medicine

## 2018-03-20 ENCOUNTER — Other Ambulatory Visit: Payer: Self-pay | Admitting: Internal Medicine

## 2018-03-21 ENCOUNTER — Other Ambulatory Visit: Payer: Self-pay | Admitting: Family Medicine

## 2018-04-09 ENCOUNTER — Encounter: Payer: Self-pay | Admitting: Internal Medicine

## 2018-04-09 ENCOUNTER — Ambulatory Visit (INDEPENDENT_AMBULATORY_CARE_PROVIDER_SITE_OTHER): Payer: Managed Care, Other (non HMO) | Admitting: Internal Medicine

## 2018-04-09 VITALS — BP 122/76 | HR 67 | Temp 98.2°F | Ht 69.0 in | Wt 282.0 lb

## 2018-04-09 DIAGNOSIS — J069 Acute upper respiratory infection, unspecified: Secondary | ICD-10-CM | POA: Insufficient documentation

## 2018-04-09 DIAGNOSIS — E785 Hyperlipidemia, unspecified: Secondary | ICD-10-CM

## 2018-04-09 DIAGNOSIS — I10 Essential (primary) hypertension: Secondary | ICD-10-CM

## 2018-04-09 DIAGNOSIS — R739 Hyperglycemia, unspecified: Secondary | ICD-10-CM | POA: Diagnosis not present

## 2018-04-09 DIAGNOSIS — Z0000 Encounter for general adult medical examination without abnormal findings: Secondary | ICD-10-CM

## 2018-04-09 DIAGNOSIS — I5032 Chronic diastolic (congestive) heart failure: Secondary | ICD-10-CM

## 2018-04-09 MED ORDER — AZITHROMYCIN 250 MG PO TABS: ORAL_TABLET | ORAL | 1 refills | Status: DC

## 2018-04-09 NOTE — Assessment & Plan Note (Signed)
stable overall by history and exam, recent data reviewed with pt, and pt to continue medical treatment as before,  to f/u any worsening symptoms or concerns  

## 2018-04-09 NOTE — Progress Notes (Signed)
Subjective:    Patient ID: Joshua Ishihara Sr., male    DOB: 01/06/1951, 68 y.o.   MRN: [id number]  HPI   Here with 2-3 days acute onset fever, facial pain, pressure, headache, general weakness and malaise, and greenish d/c, with mild ST and cough, but pt denies chest pain, wheezing, increased sob or doe, orthopnea, PND, increased LE swelling, palpitations, dizziness or syncope. Has cut out nsaids due to recent GI symptoms, now taking tumeric and tramadol and tylenol with some improvement of chronic knee and ankle pain.  Pt denies new neurological symptoms such as new headache, or facial or extremity weakness or numbness   Pt denies polydipsia, polyuria Still working full time in maintenance at [employer]. Past Medical History:  Diagnosis Date  . Acute pharyngitis 01/26/2009  . ALLERGIC RHINITIS 08/07/2007  . Allergy   . ARTHRITIS 08/07/2007  . BACK PAIN 09/15/2009  . BENIGN PROSTATIC HYPERTROPHY 05/20/2008  . Cataract   . EATING DISORDER, HX OF 08/07/2007  . ERECTILE DYSFUNCTION 02/18/2008  . HYPERLIPIDEMIA 08/07/2007  . HYPERTENSION 08/07/2007  . NECK PAIN, RIGHT 09/15/2009  . 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 SINUSITIS- ACUTE-NOS 04/30/2010  . Sleep apnea    Past Surgical History:  Procedure Laterality Date  . GASTRIC BYPASS  2006  . Left foot surgery  2008  . Lower back  2004  . Right knee arthroscopy  1994    reports that he has never smoked. He has never used smokeless tobacco. He reports current alcohol use. He reports that he does not use drugs. family history includes Heart disease in his father; Hyperlipidemia in his father; Hypertension in his father. Allergies  Allergen Reactions  . Ace Inhibitors     REACTION: tongue swelling   Current Outpatient Medications on File Prior to Visit  Medication Sig Dispense Refill  . acetaminophen (TYLENOL) 500 MG tablet Take 500 mg by mouth every 6 (six) hours as needed.    Marland Kitchen amLODipine (NORVASC) 5 MG tablet TAKE 1  TABLET BY MOUTH EVERY DAY 90 tablet 1  . atorvastatin (LIPITOR) 40 MG tablet Please specify directions, refills and quantity 90 tablet 1  . cyclobenzaprine (FLEXERIL) 5 MG tablet TAKE 1 TABLET (5 MG TOTAL) BY MOUTH 3 (THREE) TIMES DAILY AS NEEDED FOR MUSCLE SPASMS. 60 tablet 1  . desloratadine (CLARINEX) 5 MG tablet TAKE 1 TABLET BY MOUTH EVERY DAY 90 tablet 0  . Diclofenac Sodium (PENNSAID) 2 % SOLN Place 2 application onto the skin 2 (two) times daily. 112 g 3  . flintstones complete (FLINTSTONES) 60 MG chewable tablet Chew 1 tablet by mouth daily.      . furosemide (LASIX) 20 MG tablet TAKE 1 TABLET BY MOUTH EVERY DAY 90 tablet 1  . irbesartan (AVAPRO) 150 MG tablet TAKE 1 TABLET BY MOUTH EVERY DAY 90 tablet 3  . Misc Natural Products (TART CHERRY ADVANCED PO) Take by mouth.    . Omega-3 Krill Oil 500 MG CAPS Take 1 capsule by mouth daily.    Marland Kitchen OVER THE COUNTER MEDICATION Black Cherry Capsule one time a day.    . pantoprazole (PROTONIX) 40 MG tablet Take 1 tablet (40 mg total) by mouth 2 (two) times daily. 60 tablet 2  . sildenafil (VIAGRA) 100 MG tablet Take 0.5-1 tablets (50-100 mg total) by mouth daily as needed for erectile dysfunction. 5 tablet 11  . silodosin (RAPAFLO) 4 MG CAPS capsule Take 1 capsule (4 mg total) by mouth daily with breakfast. 60 capsule  11  . tadalafil (ADCIRCA/CIALIS) 20 MG tablet TAKE 1 TABLET BY MOUTH EVERY DAY AS NEEDED 6 tablet 9  . traMADol (ULTRAM) 50 MG tablet TAKE 1 TABLET BY MOUTH EVERY 6 HOURS AS NEEDED MODERATE PAIN 120 tablet 2  . Turmeric 500 MG CAPS Take by mouth 2 (two) times daily.    . valsartan (DIOVAN) 160 MG tablet Take 160 mg by mouth daily.  3  . Vitamin D, Ergocalciferol, (DRISDOL) 1.25 MG (50000 UT) CAPS capsule TAKE 1 CAPSULE BY MOUTH ONE TIME PER WEEK 12 capsule 0   No current facility-administered medications on file prior to visit.    Review of Systems  Constitutional: Negative for other unusual diaphoresis or sweats HENT: Negative for  ear discharge or swelling Eyes: Negative for other worsening visual disturbances Respiratory: Negative for stridor or other swelling  Gastrointestinal: Negative for worsening distension or other blood Genitourinary: Negative for retention or other urinary change Musculoskeletal: Negative for other MSK pain or swelling Skin: Negative for color change or other new lesions Neurological: Negative for worsening tremors and other numbness  Psychiatric/Behavioral: Negative for worsening agitation or other fatigue All other system neg per pt    Objective:   Physical Exam BP 122/76   Pulse 67   Temp 98.2 F (36.8 C) (Oral)   Ht 5\' 9"  (1.753 m)   Wt 282 lb (127.9 kg)   SpO2 94%   BMI 41.64 kg/m  VS noted, mild ill Constitutional: Pt appears in NAD HENT: Head: NCAT.  Right Ear: External ear normal.  Left Ear: External ear normal.  Bilat tm's with mild erythema.  Max sinus areas mild tender.  Pharynx with mild erythema, no exudate Eyes: . Pupils are equal, round, and reactive to light. Conjunctivae and EOM are normal Nose: without d/c or deformity Neck: Neck supple. Gross normal ROM Cardiovascular: Normal rate and regular rhythm.   Pulmonary/Chest: Effort normal and breath sounds without rales or wheezing.  Neurological: Pt is alert. At baseline orientation, motor grossly intact Skin: Skin is warm. No rashes, other new lesions, no LE edema Psychiatric: Pt behavior is normal without agitation  No other exam findings  Lab Results  Component Value Date   WBC 6.0 10/04/2017   HGB 12.2 (L) 10/04/2017   HCT 37.6 (L) 10/04/2017   PLT 198.0 10/04/2017   GLUCOSE 95 10/04/2017   CHOL 132 10/04/2017   TRIG 59.0 10/04/2017   HDL 50.30 10/04/2017   LDLDIRECT 157.6 01/02/2009   LDLCALC 70 10/04/2017   ALT 15 10/04/2017   AST 17 10/04/2017   NA 142 10/04/2017   K 4.3 10/04/2017   CL 107 10/04/2017   CREATININE 0.89 10/04/2017   BUN 18 10/04/2017   CO2 31 10/04/2017   TSH 0.77  10/04/2017   PSA 1.26 10/04/2017   HGBA1C 6.2 10/04/2017       Assessment & Plan:

## 2018-04-09 NOTE — Assessment & Plan Note (Signed)
Mild to mod, for antibx course,  to f/u any worsening symptoms or concerns 

## 2018-04-09 NOTE — Patient Instructions (Signed)
Please take all new medication as prescribed - the antibiotic  Please continue all other medications as before, and refills have been done if requested.  Please have the pharmacy call with any other refills you may need.  Please continue your efforts at being more active, low cholesterol diet, and weight control.  Please keep your appointments with your specialists as you may have planned  Please return in 6 months, or sooner if needed, with Lab testing done 3-5 days before

## 2018-05-14 ENCOUNTER — Other Ambulatory Visit: Payer: Self-pay | Admitting: Physician Assistant

## 2018-05-21 ENCOUNTER — Other Ambulatory Visit: Payer: Self-pay | Admitting: Internal Medicine

## 2018-05-21 NOTE — Telephone Encounter (Signed)
Done erx 

## 2018-06-22 ENCOUNTER — Other Ambulatory Visit: Payer: Self-pay | Admitting: Internal Medicine

## 2018-06-22 ENCOUNTER — Other Ambulatory Visit: Payer: Self-pay | Admitting: Family Medicine

## 2018-06-25 NOTE — Telephone Encounter (Signed)
Left message for patient to call back to schedule virtual visit for rx refill.

## 2018-06-29 ENCOUNTER — Encounter: Payer: Self-pay | Admitting: Internal Medicine

## 2018-08-12 ENCOUNTER — Other Ambulatory Visit: Payer: Self-pay | Admitting: Physician Assistant

## 2018-08-19 ENCOUNTER — Other Ambulatory Visit: Payer: Self-pay | Admitting: Internal Medicine

## 2018-08-25 ENCOUNTER — Other Ambulatory Visit: Payer: Self-pay | Admitting: Internal Medicine

## 2018-10-08 ENCOUNTER — Other Ambulatory Visit: Payer: Self-pay

## 2018-10-08 ENCOUNTER — Other Ambulatory Visit (INDEPENDENT_AMBULATORY_CARE_PROVIDER_SITE_OTHER): Payer: Managed Care, Other (non HMO)

## 2018-10-08 ENCOUNTER — Encounter: Payer: Self-pay | Admitting: Internal Medicine

## 2018-10-08 ENCOUNTER — Other Ambulatory Visit: Payer: Self-pay | Admitting: Internal Medicine

## 2018-10-08 ENCOUNTER — Ambulatory Visit (INDEPENDENT_AMBULATORY_CARE_PROVIDER_SITE_OTHER): Payer: Managed Care, Other (non HMO) | Admitting: Internal Medicine

## 2018-10-08 VITALS — BP 152/70 | HR 98 | Temp 98.5°F | Resp 15 | Ht 69.0 in | Wt 291.0 lb

## 2018-10-08 DIAGNOSIS — Z0000 Encounter for general adult medical examination without abnormal findings: Secondary | ICD-10-CM | POA: Diagnosis not present

## 2018-10-08 DIAGNOSIS — E559 Vitamin D deficiency, unspecified: Secondary | ICD-10-CM

## 2018-10-08 DIAGNOSIS — E611 Iron deficiency: Secondary | ICD-10-CM

## 2018-10-08 DIAGNOSIS — R739 Hyperglycemia, unspecified: Secondary | ICD-10-CM

## 2018-10-08 DIAGNOSIS — E538 Deficiency of other specified B group vitamins: Secondary | ICD-10-CM

## 2018-10-08 LAB — LIPID PANEL
Cholesterol: 145 mg/dL (ref 0–200)
HDL: 51.4 mg/dL (ref >39.00)
LDL Cholesterol: 71 mg/dL (ref 0–99)
NonHDL: 93.41
Total CHOL/HDL Ratio: 3
Triglycerides: 111 mg/dL (ref 0.0–149.0)
VLDL: 22.2 mg/dL (ref 0.0–40.0)

## 2018-10-08 LAB — URINALYSIS, ROUTINE W REFLEX MICROSCOPIC
Bilirubin Urine: NEGATIVE
Hgb urine dipstick: NEGATIVE
Ketones, ur: NEGATIVE
Leukocytes,Ua: NEGATIVE
Nitrite: NEGATIVE
RBC / HPF: NONE SEEN (ref 0–?)
Specific Gravity, Urine: 1.015 (ref 1.000–1.030)
Total Protein, Urine: NEGATIVE
Urine Glucose: NEGATIVE
Urobilinogen, UA: 0.2 (ref 0.0–1.0)
pH: 5.5 (ref 5.0–8.0)

## 2018-10-08 LAB — CBC WITH DIFFERENTIAL/PLATELET
Basophils Absolute: 0 10*3/uL (ref 0.0–0.1)
Basophils Relative: 0.7 % (ref 0.0–3.0)
Eosinophils Absolute: 0.2 10*3/uL (ref 0.0–0.7)
Eosinophils Relative: 3.2 % (ref 0.0–5.0)
HCT: 40.1 % (ref 39.0–52.0)
Hemoglobin: 13.2 g/dL (ref 13.0–17.0)
Lymphocytes Relative: 15.9 % (ref 12.0–46.0)
Lymphs Abs: 1 10*3/uL (ref 0.7–4.0)
MCHC: 32.8 g/dL (ref 30.0–36.0)
MCV: 85.2 fl (ref 78.0–100.0)
Monocytes Absolute: 0.8 10*3/uL (ref 0.1–1.0)
Monocytes Relative: 13.4 % — ABNORMAL HIGH (ref 3.0–12.0)
Neutro Abs: 4.1 10*3/uL (ref 1.4–7.7)
Neutrophils Relative %: 66.8 % (ref 43.0–77.0)
Platelets: 201 10*3/uL (ref 150.0–400.0)
RBC: 4.7 Mil/uL (ref 4.22–5.81)
RDW: 15.1 % (ref 11.5–15.5)
WBC: 6.1 10*3/uL (ref 4.0–10.5)

## 2018-10-08 LAB — BASIC METABOLIC PANEL
BUN: 12 mg/dL (ref 6–23)
CO2: 30 mEq/L (ref 19–32)
Calcium: 9.5 mg/dL (ref 8.4–10.5)
Chloride: 104 mEq/L (ref 96–112)
Creatinine, Ser: 0.86 mg/dL (ref 0.40–1.50)
GFR: 107.09 mL/min (ref >60.00)
Glucose, Bld: 72 mg/dL (ref 70–99)
Potassium: 4.2 mEq/L (ref 3.5–5.1)
Sodium: 140 mEq/L (ref 135–145)

## 2018-10-08 LAB — HEPATIC FUNCTION PANEL
ALT: 14 U/L (ref 0–53)
AST: 17 U/L (ref 0–37)
Albumin: 4.2 g/dL (ref 3.5–5.2)
Alkaline Phosphatase: 72 U/L (ref 39–117)
Bilirubin, Direct: 0.1 mg/dL (ref 0.0–0.3)
Total Bilirubin: 0.6 mg/dL (ref 0.2–1.2)
Total Protein: 6.8 g/dL (ref 6.0–8.3)

## 2018-10-08 LAB — PSA: PSA: 0.92 ng/mL (ref 0.10–4.00)

## 2018-10-08 LAB — IBC PANEL
Iron: 44 ug/dL (ref 42–165)
Saturation Ratios: 10.3 % — ABNORMAL LOW (ref 20.0–50.0)
Transferrin: 306 mg/dL (ref 212.0–360.0)

## 2018-10-08 LAB — HEMOGLOBIN A1C: Hgb A1c MFr Bld: 6.2 % (ref 4.6–6.5)

## 2018-10-08 LAB — TSH: TSH: 0.98 u[IU]/mL (ref 0.35–4.50)

## 2018-10-08 LAB — VITAMIN B12: Vitamin B-12: 168 pg/mL — ABNORMAL LOW (ref 211–911)

## 2018-10-08 NOTE — Patient Instructions (Signed)

## 2018-10-08 NOTE — Assessment & Plan Note (Signed)

## 2018-10-08 NOTE — Assessment & Plan Note (Signed)
stable overall by history and exam, recent data reviewed with pt, and pt to continue medical treatment as before,  to f/u any worsening symptoms or concerns  

## 2018-10-08 NOTE — Progress Notes (Signed)
Subjective:    Patient ID: Joshua Ishihara Sr., male    DOB: 04-05-1950, 68 y.o.   MRN: [id number]  HPI  Here for wellness and f/u;  Overall doing ok;  Pt denies Chest pain, worsening SOB, DOE, wheezing, orthopnea, PND, worsening LE edema, palpitations, dizziness or syncope.  Pt denies neurological change such as new headache, facial or extremity weakness.  Pt denies polydipsia, polyuria, or low sugar symptoms. Pt states overall good compliance with treatment and medications, good tolerability, and has been trying to follow appropriate diet.  Pt denies worsening depressive symptoms, suicidal ideation or panic. No fever, night sweats, wt loss, loss of appetite, or other constitutional symptoms.  Pt states good ability with ADL's, has low fall risk, home safety reviewed and adequate, no other significant changes in hearing or vision, and only occasionally active with exercise. Has gained wt with less activity recently Wt Readings from Last 3 Encounters:  10/08/18 291 lb (132 kg)  04/09/18 282 lb (127.9 kg)  02/08/18 283 lb (128.4 kg)  Saw urology last wk; no new complaints  BP at home < 14090 Past Medical History:  Diagnosis Date  . Acute pharyngitis 01/26/2009  . ALLERGIC RHINITIS 08/07/2007  . Allergy   . ARTHRITIS 08/07/2007  . BACK PAIN 09/15/2009  . BENIGN PROSTATIC HYPERTROPHY 05/20/2008  . Cataract   . EATING DISORDER, HX OF 08/07/2007  . ERECTILE DYSFUNCTION 02/18/2008  . HYPERLIPIDEMIA 08/07/2007  . HYPERTENSION 08/07/2007  . NECK PAIN, RIGHT 09/15/2009  . 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 SINUSITIS- ACUTE-NOS 04/30/2010  . Sleep apnea    Past Surgical History:  Procedure Laterality Date  . GASTRIC BYPASS  2006  . Left foot surgery  2008  . Lower back  2004  . Right knee arthroscopy  1994    reports that he has never smoked. He has never used smokeless tobacco. He reports current alcohol use. He reports that he does not use drugs. family history includes Heart disease  in his father; Hyperlipidemia in his father; Hypertension in his father. Allergies  Allergen Reactions  . Ace Inhibitors     REACTION: tongue swelling   Current Outpatient Medications on File Prior to Visit  Medication Sig Dispense Refill  . acetaminophen (TYLENOL) 500 MG tablet Take 500 mg by mouth every 6 (six) hours as needed.    Marland Kitchen amLODipine (NORVASC) 5 MG tablet TAKE 1 TABLET BY MOUTH EVERY DAY 90 tablet 1  . atorvastatin (LIPITOR) 40 MG tablet TAKE 1 TABLET BY MOUTH EVERY DAY 90 tablet 1  . azithromycin (ZITHROMAX Z-PAK) 250 MG tablet 2 tab by mouth day 1, then 1 per day 6 tablet 1  . cyclobenzaprine (FLEXERIL) 5 MG tablet TAKE 1 TABLET (5 MG TOTAL) BY MOUTH 3 (THREE) TIMES DAILY AS NEEDED FOR MUSCLE SPASMS. 60 tablet 1  . desloratadine (CLARINEX) 5 MG tablet TAKE 1 TABLET BY MOUTH EVERY DAY 90 tablet 1  . Diclofenac Sodium (PENNSAID) 2 % SOLN Place 2 application onto the skin 2 (two) times daily. 112 g 3  . flintstones complete (FLINTSTONES) 60 MG chewable tablet Chew 1 tablet by mouth daily.      . furosemide (LASIX) 20 MG tablet TAKE 1 TABLET BY MOUTH EVERY DAY 90 tablet 1  . Misc Natural Products (TART CHERRY ADVANCED PO) Take by mouth.    . Omega-3 Krill Oil 500 MG CAPS Take 1 capsule by mouth daily.    Marland Kitchen OVER THE COUNTER MEDICATION Black Cherry Capsule one time  a day.    . pantoprazole (PROTONIX) 40 MG tablet TAKE 1 TABLET BY MOUTH TWICE A DAY 180 tablet 0  . sildenafil (VIAGRA) 100 MG tablet Take 0.5-1 tablets (50-100 mg total) by mouth daily as needed for erectile dysfunction. 5 tablet 11  . silodosin (RAPAFLO) 4 MG CAPS capsule Take 1 capsule (4 mg total) by mouth daily with breakfast. 60 capsule 11  . tadalafil (ADCIRCA/CIALIS) 20 MG tablet TAKE 1 TABLET BY MOUTH EVERY DAY AS NEEDED 6 tablet 9  . traMADol (ULTRAM) 50 MG tablet TAKE 1 TABLET BY MOUTH EVERY 6 HOURS AS NEEDED MODERATE PAIN 120 tablet 2  . Turmeric 500 MG CAPS Take by mouth 2 (two) times daily.    . valsartan  (DIOVAN) 160 MG tablet Take 160 mg by mouth daily.  3  . Vitamin D, Ergocalciferol, (DRISDOL) 1.25 MG (50000 UT) CAPS capsule TAKE 1 CAPSULE BY MOUTH ONE TIME PER WEEK 12 capsule 0   No current facility-administered medications on file prior to visit.    Review of Systems  Constitutional: Negative for other unusual diaphoresis or sweats HENT: Negative for ear discharge or swelling Eyes: Negative for other worsening visual disturbances Respiratory: Negative for stridor or other swelling  Gastrointestinal: Negative for worsening distension or other blood Genitourinary: Negative for retention or other urinary change Musculoskeletal: Negative for other MSK pain or swelling Skin: Negative for color change or other new lesions Neurological: Negative for worsening tremors and other numbness  Psychiatric/Behavioral: Negative for worsening agitation or other fatigue All other system neg per pt    Objective:   Physical Exam BP (!) 152/70   Pulse 98   Temp 98.5 F (36.9 C) (Oral)   Resp 15   Ht 5\' 9"  (1.753 m)   Wt 291 lb (132 kg)   SpO2 96%   BMI 42.97 kg/m  VS noted,  Constitutional: Pt appears in NAD HENT: Head: NCAT.  Right Ear: External ear normal.  Left Ear: External ear normal.  Eyes: . Pupils are equal, round, and reactive to light. Conjunctivae and EOM are normal Nose: without d/c or deformity Neck: Neck supple. Gross normal ROM Cardiovascular: Normal rate and regular rhythm.   Pulmonary/Chest: Effort normal and breath sounds without rales or wheezing.  Abd:  Soft, NT, ND, + BS, no organomegaly Neurological: Pt is alert. At baseline orientation, motor grossly intact Skin: Skin is warm. No rashes, other new lesions, no LE edema Psychiatric: Pt behavior is normal without agitation  No other exam findings Lab Results  Component Value Date   WBC 6.0 10/04/2017   HGB 12.2 (L) 10/04/2017   HCT 37.6 (L) 10/04/2017   PLT 198.0 10/04/2017   GLUCOSE 95 10/04/2017   CHOL 132  10/04/2017   TRIG 59.0 10/04/2017   HDL 50.30 10/04/2017   LDLDIRECT 157.6 01/02/2009   LDLCALC 70 10/04/2017   ALT 15 10/04/2017   AST 17 10/04/2017   NA 142 10/04/2017   K 4.3 10/04/2017   CL 107 10/04/2017   CREATININE 0.89 10/04/2017   BUN 18 10/04/2017   CO2 31 10/04/2017   TSH 0.77 10/04/2017   PSA 1.26 10/04/2017   HGBA1C 6.2 10/04/2017       Assessment & Plan:

## 2018-10-09 LAB — VITAMIN D 25 HYDROXY (VIT D DEFICIENCY, FRACTURES): VITD: 41.39 ng/mL (ref 30.00–100.00)

## 2018-10-10 ENCOUNTER — Ambulatory Visit (INDEPENDENT_AMBULATORY_CARE_PROVIDER_SITE_OTHER): Payer: Managed Care, Other (non HMO)

## 2018-10-10 DIAGNOSIS — E538 Deficiency of other specified B group vitamins: Secondary | ICD-10-CM

## 2018-10-10 MED ORDER — CYANOCOBALAMIN 1000 MCG/ML IJ SOLN
1000.0000 ug | Freq: Once | INTRAMUSCULAR | Status: AC
  Administered 2018-10-10: 14:00:00 1000 ug via INTRAMUSCULAR

## 2018-10-10 NOTE — Progress Notes (Signed)
Medical screening examination/treatment/procedure(s) were performed by non-physician practitioner and as supervising physician I was immediately available for consultation/collaboration. I agree with above. James [name], MD   

## 2018-11-10 ENCOUNTER — Other Ambulatory Visit: Payer: Self-pay | Admitting: Gastroenterology

## 2018-11-12 ENCOUNTER — Encounter: Payer: Self-pay | Admitting: Internal Medicine

## 2018-11-14 ENCOUNTER — Ambulatory Visit: Payer: Managed Care, Other (non HMO)

## 2018-11-15 ENCOUNTER — Ambulatory Visit (INDEPENDENT_AMBULATORY_CARE_PROVIDER_SITE_OTHER): Payer: Managed Care, Other (non HMO)

## 2018-11-15 DIAGNOSIS — Z23 Encounter for immunization: Secondary | ICD-10-CM | POA: Diagnosis not present

## 2018-11-15 DIAGNOSIS — E538 Deficiency of other specified B group vitamins: Secondary | ICD-10-CM

## 2018-11-15 MED ORDER — CYANOCOBALAMIN 1000 MCG/ML IJ SOLN
1000.0000 ug | Freq: Once | INTRAMUSCULAR | Status: AC
  Administered 2018-11-15: 1000 ug via INTRAMUSCULAR

## 2018-11-15 NOTE — Progress Notes (Signed)
Medical screening examination/treatment/procedure(s) were performed by non-physician practitioner and as supervising physician I was immediately available for consultation/collaboration. I agree with above. James [name], MD   

## 2018-11-29 ENCOUNTER — Other Ambulatory Visit: Payer: Self-pay | Admitting: Internal Medicine

## 2018-12-08 ENCOUNTER — Other Ambulatory Visit: Payer: Self-pay | Admitting: Internal Medicine

## 2018-12-12 ENCOUNTER — Other Ambulatory Visit: Payer: Self-pay | Admitting: Internal Medicine

## 2018-12-12 MED ORDER — TRAMADOL HCL 50 MG PO TABS: ORAL_TABLET | ORAL | 2 refills | Status: DC

## 2018-12-12 NOTE — Telephone Encounter (Signed)
Done erx 

## 2018-12-12 NOTE — Telephone Encounter (Signed)
Medication refill: traMADol (ULTRAM) 50 MG tablet [id number]    Pharmacy:  [id number] - [place], [place] [phone] (Phone) [phone] (Fax)

## 2018-12-12 NOTE — Telephone Encounter (Signed)
Requested medication (s) are due for refill today: yes  Requested medication (s) are on the active medication list:yes  Last refill:  05/21/2018  Future visit scheduled: no  Notes to clinic: refill cannot be delegated    Requested Prescriptions  Pending Prescriptions Disp Refills   traMADol (ULTRAM) 50 MG tablet 120 tablet 2     Not Delegated - Analgesics:  Opioid Agonists Failed - 12/12/2018 12:03 PM      Failed - This refill cannot be delegated      Failed - Urine Drug Screen completed in last 360 days.      Passed - Valid encounter within last 6 months    Recent Outpatient Visits          2 months ago Preventative health care   [facility] -Georges Mouse, MD   8 months ago Acute upper respiratory infection   [facility] -Georges Mouse, MD   10 months ago Cough   [facility] -Georges Mouse, MD   1 year ago Preventative health care   [facility] -Georges Mouse, MD   1 year ago Essential hypertension   [facility] -Georges Mouse, MD

## 2018-12-14 ENCOUNTER — Ambulatory Visit (INDEPENDENT_AMBULATORY_CARE_PROVIDER_SITE_OTHER): Payer: Managed Care, Other (non HMO)

## 2018-12-14 DIAGNOSIS — E538 Deficiency of other specified B group vitamins: Secondary | ICD-10-CM

## 2018-12-14 MED ORDER — CYANOCOBALAMIN 1000 MCG/ML IJ SOLN
1000.0000 ug | Freq: Once | INTRAMUSCULAR | Status: AC
  Administered 2018-12-14: 1000 ug via INTRAMUSCULAR

## 2018-12-14 NOTE — Progress Notes (Signed)
b12 Injection given.   Estanislao Harmon J Melizza Kanode, MD  

## 2018-12-31 ENCOUNTER — Other Ambulatory Visit: Payer: Self-pay | Admitting: Internal Medicine

## 2019-01-11 ENCOUNTER — Other Ambulatory Visit: Payer: Self-pay

## 2019-01-11 ENCOUNTER — Ambulatory Visit (INDEPENDENT_AMBULATORY_CARE_PROVIDER_SITE_OTHER): Payer: Managed Care, Other (non HMO)

## 2019-01-11 DIAGNOSIS — Z23 Encounter for immunization: Secondary | ICD-10-CM

## 2019-01-11 DIAGNOSIS — E538 Deficiency of other specified B group vitamins: Secondary | ICD-10-CM

## 2019-01-11 MED ORDER — CYANOCOBALAMIN 1000 MCG/ML IJ SOLN
1000.0000 ug | Freq: Once | INTRAMUSCULAR | Status: AC
  Administered 2019-01-11: 16:00:00 1000 ug via INTRAMUSCULAR

## 2019-01-11 NOTE — Progress Notes (Signed)
Medical screening examination/treatment/procedure(s) were performed by non-physician practitioner and as supervising physician I was immediately available for consultation/collaboration. I agree with above. Lakendria Nicastro, MD   

## 2019-01-29 ENCOUNTER — Other Ambulatory Visit: Payer: Self-pay | Admitting: Internal Medicine

## 2019-02-11 ENCOUNTER — Other Ambulatory Visit: Payer: Self-pay

## 2019-02-11 ENCOUNTER — Ambulatory Visit (INDEPENDENT_AMBULATORY_CARE_PROVIDER_SITE_OTHER): Payer: Managed Care, Other (non HMO)

## 2019-02-11 DIAGNOSIS — E538 Deficiency of other specified B group vitamins: Secondary | ICD-10-CM | POA: Diagnosis not present

## 2019-02-11 MED ORDER — CYANOCOBALAMIN 1000 MCG/ML IJ SOLN
1000.0000 ug | Freq: Once | INTRAMUSCULAR | Status: AC
  Administered 2019-02-11: 16:00:00 1000 ug via INTRAMUSCULAR

## 2019-02-11 NOTE — Progress Notes (Signed)
Medical screening examination/treatment/procedure(s) were performed by non-physician practitioner and as supervising physician I was immediately available for consultation/collaboration. I agree with above. James [name], MD   

## 2019-03-03 ENCOUNTER — Other Ambulatory Visit: Payer: Self-pay | Admitting: Internal Medicine

## 2019-03-03 NOTE — Telephone Encounter (Signed)
As per routine refill policy please

## 2019-03-13 ENCOUNTER — Other Ambulatory Visit: Payer: Self-pay

## 2019-03-13 ENCOUNTER — Ambulatory Visit (INDEPENDENT_AMBULATORY_CARE_PROVIDER_SITE_OTHER): Payer: 59

## 2019-03-13 DIAGNOSIS — E538 Deficiency of other specified B group vitamins: Secondary | ICD-10-CM

## 2019-03-13 MED ORDER — CYANOCOBALAMIN 1000 MCG/ML IJ SOLN
1000.0000 ug | Freq: Once | INTRAMUSCULAR | Status: AC
  Administered 2019-03-13: 1000 ug via INTRAMUSCULAR

## 2019-03-13 NOTE — Progress Notes (Signed)
Medical screening examination/treatment/procedure(s) were performed by non-physician practitioner and as supervising physician I was immediately available for consultation/collaboration. I agree with above. Ailynn Gow, MD   

## 2019-04-02 ENCOUNTER — Ambulatory Visit: Payer: 59

## 2019-04-12 ENCOUNTER — Ambulatory Visit: Payer: 59

## 2019-04-12 ENCOUNTER — Ambulatory Visit: Payer: 59 | Attending: Internal Medicine

## 2019-04-12 DIAGNOSIS — Z23 Encounter for immunization: Secondary | ICD-10-CM | POA: Insufficient documentation

## 2019-04-12 NOTE — Progress Notes (Signed)
   [id number]  Name:  Joshua Graser Sr.    MRN: [id number] DOB: Jul 02, 1950  04/12/2019  Joshua Burnett was observed post Covid-19 immunization for 30 minutes based on pre-vaccination screening without incidence. He was provided with Vaccine Information Sheet and instruction to access the V-Safe system.   Joshua Burnett was instructed to call 911 with any severe reactions post vaccine: Marland Kitchen Difficulty breathing  . Swelling of your face and throat  . A fast heartbeat  . A bad rash all over your body  . Dizziness and weakness    Immunizations Administered    Name Date Dose VIS Date Route   Pfizer COVID-19 Vaccine 04/12/2019 10:05 AM 0.3 mL 02/15/2019 Intramuscular   Manufacturer: [place]   Lot: [id number]   [place]: [id number]

## 2019-04-13 ENCOUNTER — Ambulatory Visit: Payer: 59

## 2019-04-19 ENCOUNTER — Ambulatory Visit (INDEPENDENT_AMBULATORY_CARE_PROVIDER_SITE_OTHER): Payer: 59

## 2019-04-19 ENCOUNTER — Other Ambulatory Visit: Payer: Self-pay

## 2019-04-19 DIAGNOSIS — E538 Deficiency of other specified B group vitamins: Secondary | ICD-10-CM | POA: Diagnosis not present

## 2019-04-19 MED ORDER — CYANOCOBALAMIN 1000 MCG/ML IJ SOLN
1000.0000 ug | Freq: Once | INTRAMUSCULAR | Status: AC
  Administered 2019-04-19: 15:00:00 1000 ug via INTRAMUSCULAR

## 2019-04-24 NOTE — Progress Notes (Signed)
Medical screening examination/treatment/procedure(s) were performed by non-physician practitioner and as supervising physician I was immediately available for consultation/collaboration. I agree with above. Zyier Dykema, MD   

## 2019-05-06 ENCOUNTER — Encounter: Payer: Self-pay | Admitting: Internal Medicine

## 2019-05-07 ENCOUNTER — Ambulatory Visit: Payer: 59 | Attending: Internal Medicine

## 2019-05-07 DIAGNOSIS — Z23 Encounter for immunization: Secondary | ICD-10-CM | POA: Insufficient documentation

## 2019-05-07 NOTE — Progress Notes (Signed)
   [id number]  Name:  Joshua Tipple Sr.    MRN: [id number] DOB: May 24, 1950  05/07/2019  Joshua Burnett was observed post Covid-19 immunization for 15 minutes without incident. He was provided with Vaccine Information Sheet and instruction to access the V-Safe system.   Joshua Burnett was instructed to call 911 with any severe reactions post vaccine: Marland Kitchen Difficulty breathing  . Swelling of face and throat  . A fast heartbeat  . A bad rash all over body  . Dizziness and weakness   Immunizations Administered    Name Date Dose VIS Date Route   Pfizer COVID-19 Vaccine 05/07/2019  2:53 PM 0.3 mL 02/15/2019 Intramuscular   Manufacturer: [place]   Lot: [id number]   [place]: [id number]

## 2019-05-17 ENCOUNTER — Other Ambulatory Visit: Payer: Self-pay | Admitting: Internal Medicine

## 2019-05-17 NOTE — Telephone Encounter (Signed)
Please refill as per office routine med refill policy (all routine meds refilled for 3 mo or monthly per pt preference up to one year from last visit, then month to month grace period for 3 mo, then further med refills will have to be denied)  

## 2019-05-20 ENCOUNTER — Other Ambulatory Visit: Payer: Self-pay

## 2019-05-20 MED ORDER — AMLODIPINE BESYLATE 5 MG PO TABS: 5.0000 mg | ORAL_TABLET | Freq: Every day | ORAL | 1 refills | Status: DC

## 2019-07-24 ENCOUNTER — Other Ambulatory Visit: Payer: Self-pay | Admitting: Internal Medicine

## 2019-07-24 ENCOUNTER — Other Ambulatory Visit: Payer: Self-pay | Admitting: Gastroenterology

## 2019-07-24 NOTE — Telephone Encounter (Signed)
Please refill as per office routine med refill policy (all routine meds refilled for 3 mo or monthly per pt preference up to one year from last visit, then month to month grace period for 3 mo, then further med refills will have to be denied)  

## 2019-07-25 ENCOUNTER — Ambulatory Visit (INDEPENDENT_AMBULATORY_CARE_PROVIDER_SITE_OTHER)
Admission: RE | Admit: 2019-07-25 | Discharge: 2019-07-25 | Disposition: A | Discharge status: Home / Self Care | Payer: 59 | Source: Ambulatory Visit | Attending: Internal Medicine | Admitting: Internal Medicine

## 2019-07-25 ENCOUNTER — Ambulatory Visit (INDEPENDENT_AMBULATORY_CARE_PROVIDER_SITE_OTHER): Payer: 59 | Admitting: Internal Medicine

## 2019-07-25 ENCOUNTER — Other Ambulatory Visit: Payer: Self-pay

## 2019-07-25 ENCOUNTER — Other Ambulatory Visit (INDEPENDENT_AMBULATORY_CARE_PROVIDER_SITE_OTHER): Payer: 59

## 2019-07-25 VITALS — BP 160/86 | HR 86 | Temp 98.3°F | Ht 69.0 in | Wt 298.0 lb

## 2019-07-25 DIAGNOSIS — R0602 Shortness of breath: Secondary | ICD-10-CM

## 2019-07-25 DIAGNOSIS — E559 Vitamin D deficiency, unspecified: Secondary | ICD-10-CM | POA: Diagnosis not present

## 2019-07-25 DIAGNOSIS — I5032 Chronic diastolic (congestive) heart failure: Secondary | ICD-10-CM | POA: Diagnosis not present

## 2019-07-25 DIAGNOSIS — R252 Cramp and spasm: Secondary | ICD-10-CM

## 2019-07-25 DIAGNOSIS — Z0001 Encounter for general adult medical examination with abnormal findings: Secondary | ICD-10-CM | POA: Diagnosis not present

## 2019-07-25 DIAGNOSIS — R739 Hyperglycemia, unspecified: Secondary | ICD-10-CM | POA: Diagnosis not present

## 2019-07-25 DIAGNOSIS — E538 Deficiency of other specified B group vitamins: Secondary | ICD-10-CM | POA: Diagnosis not present

## 2019-07-25 DIAGNOSIS — G4733 Obstructive sleep apnea (adult) (pediatric): Secondary | ICD-10-CM | POA: Diagnosis not present

## 2019-07-25 DIAGNOSIS — I10 Essential (primary) hypertension: Secondary | ICD-10-CM

## 2019-07-25 LAB — CBC WITH DIFFERENTIAL/PLATELET
Basophils Absolute: 0 10*3/uL (ref 0.0–0.1)
Basophils Relative: 0.7 % (ref 0.0–3.0)
Eosinophils Absolute: 0.1 10*3/uL (ref 0.0–0.7)
Eosinophils Relative: 2 % (ref 0.0–5.0)
HCT: 39.3 % (ref 39.0–52.0)
Hemoglobin: 12.8 g/dL — ABNORMAL LOW (ref 13.0–17.0)
Lymphocytes Relative: 15.8 % (ref 12.0–46.0)
Lymphs Abs: 1 10*3/uL (ref 0.7–4.0)
MCHC: 32.6 g/dL (ref 30.0–36.0)
MCV: 84.5 fl (ref 78.0–100.0)
Monocytes Absolute: 0.7 10*3/uL (ref 0.1–1.0)
Monocytes Relative: 11 % (ref 3.0–12.0)
Neutro Abs: 4.5 10*3/uL (ref 1.4–7.7)
Neutrophils Relative %: 70.5 % (ref 43.0–77.0)
Platelets: 198 10*3/uL (ref 150.0–400.0)
RBC: 4.65 Mil/uL (ref 4.22–5.81)
RDW: 14.9 % (ref 11.5–15.5)
WBC: 6.4 10*3/uL (ref 4.0–10.5)

## 2019-07-25 LAB — URINALYSIS, ROUTINE W REFLEX MICROSCOPIC
Hgb urine dipstick: NEGATIVE
Ketones, ur: NEGATIVE
Leukocytes,Ua: NEGATIVE
Nitrite: NEGATIVE
RBC / HPF: NONE SEEN (ref 0–?)
Specific Gravity, Urine: >=1.03 — AB (ref 1.000–1.030)
Total Protein, Urine: NEGATIVE
Urine Glucose: NEGATIVE
Urobilinogen, UA: 1 (ref 0.0–1.0)
pH: 6 (ref 5.0–8.0)

## 2019-07-25 LAB — BASIC METABOLIC PANEL
BUN: 18 mg/dL (ref 6–23)
CO2: 30 mEq/L (ref 19–32)
Calcium: 9.3 mg/dL (ref 8.4–10.5)
Chloride: 106 mEq/L (ref 96–112)
Creatinine, Ser: 0.85 mg/dL (ref 0.40–1.50)
GFR: 108.29 mL/min (ref >60.00)
Glucose, Bld: 97 mg/dL (ref 70–99)
Potassium: 4.3 mEq/L (ref 3.5–5.1)
Sodium: 140 mEq/L (ref 135–145)

## 2019-07-25 LAB — LIPID PANEL
Cholesterol: 147 mg/dL (ref 0–200)
HDL: 52.2 mg/dL (ref >39.00)
LDL Cholesterol: 77 mg/dL (ref 0–99)
NonHDL: 95.25
Total CHOL/HDL Ratio: 3
Triglycerides: 90 mg/dL (ref 0.0–149.0)
VLDL: 18 mg/dL (ref 0.0–40.0)

## 2019-07-25 LAB — HEPATIC FUNCTION PANEL
ALT: 13 U/L (ref 0–53)
AST: 16 U/L (ref 0–37)
Albumin: 4 g/dL (ref 3.5–5.2)
Alkaline Phosphatase: 73 U/L (ref 39–117)
Bilirubin, Direct: 0.1 mg/dL (ref 0.0–0.3)
Total Bilirubin: 0.5 mg/dL (ref 0.2–1.2)
Total Protein: 6.4 g/dL (ref 6.0–8.3)

## 2019-07-25 LAB — PSA: PSA: 0.95 ng/mL (ref 0.10–4.00)

## 2019-07-25 LAB — TSH: TSH: 0.88 u[IU]/mL (ref 0.35–4.50)

## 2019-07-25 LAB — HEMOGLOBIN A1C: Hgb A1c MFr Bld: 6.2 % (ref 4.6–6.5)

## 2019-07-25 LAB — VITAMIN B12: Vitamin B-12: 271 pg/mL (ref 211–911)

## 2019-07-25 LAB — VITAMIN D 25 HYDROXY (VIT D DEFICIENCY, FRACTURES): VITD: 45.28 ng/mL (ref 30.00–100.00)

## 2019-07-25 MED ORDER — TIZANIDINE HCL 2 MG PO TABS: 2.0000 mg | ORAL_TABLET | Freq: Every day | ORAL | Status: DC

## 2019-07-25 MED ORDER — FUROSEMIDE 20 MG PO TABS: 20.0000 mg | ORAL_TABLET | Freq: Every day | ORAL | 3 refills | Status: DC

## 2019-07-25 NOTE — Patient Instructions (Signed)
Ok to take the lasix at 20 mg twice per day for 1 wk, then 1 per day after that  Please take all new medication as prescribed - the muscle relaxer at night if needed, or try tonic water before bedtime as needed  Please continue all other medications as before, and refills have been done if requested.  Please have the pharmacy call with any other refills you may need.  Please continue your efforts at being more active, low cholesterol diet, and weight control.  You are otherwise up to date with prevention measures today.  Please keep your appointments with your specialists as you may have planned  You will be contacted regarding the referral for: pulmonary  Please go to the XRAY Department in the first floor for the x-ray testing - at the [place] site  Please go to the LAB at the blood drawing area for the tests to be done - at the [place] will be contacted by phone if any changes need to be made immediately.  Otherwise, you will receive a letter about your results with an explanation, but please check with MyChart first.  Please remember to sign up for MyChart if you have not done so, as this will be important to you in the future with finding out test results, communicating by private email, and scheduling acute appointments online when needed.  Please make an Appointment to return in 1 month

## 2019-07-25 NOTE — Progress Notes (Signed)
Subjective:    Patient ID: Joshua Ishihara Sr., male    DOB: February 13, 1951, 69 y.o.   MRN: [id number]  HPI  Here for wellness and f/u;  Overall doing ok;  Pt denies Chest pain, worsening SOB, DOE, wheezing, orthopnea, PND, palpitations, dizziness or syncope.  Pt denies neurological change such as new headache, facial or extremity weakness.  Pt denies polydipsia, polyuria, or low sugar symptoms. Pt states overall good compliance with treatment and medications, good tolerability, and has been trying to follow appropriate diet.  Pt denies worsening depressive symptoms, suicidal ideation or panic. No fever, night sweats, wt loss, loss of appetite, or other constitutional symptoms.  Pt states good ability with ADL's, has low fall risk, home safety reviewed and adequate, no other significant changes in hearing or vision, and only occasionally active with exercise. Ran out of lasix, now with 4-5 wks gradually worsening leg swelling, but hesitates to restart due to prior leg cramps.   Wt Readings from Last 3 Encounters:  07/25/19 298 lb (135.2 kg)  10/08/18 291 lb (132 kg)  04/09/18 282 lb (127.9 kg)   Past Medical History:  Diagnosis Date  . Acute pharyngitis 01/26/2009  . ALLERGIC RHINITIS 08/07/2007  . Allergy   . ARTHRITIS 08/07/2007  . BACK PAIN 09/15/2009  . BENIGN PROSTATIC HYPERTROPHY 05/20/2008  . Cataract   . EATING DISORDER, HX OF 08/07/2007  . ERECTILE DYSFUNCTION 02/18/2008  . HYPERLIPIDEMIA 08/07/2007  . HYPERTENSION 08/07/2007  . NECK PAIN, RIGHT 09/15/2009  . 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 SINUSITIS- ACUTE-NOS 04/30/2010  . Sleep apnea    Past Surgical History:  Procedure Laterality Date  . GASTRIC BYPASS  2006  . Left foot surgery  2008  . Lower back  2004  . Right knee arthroscopy  1994    reports that he has never smoked. He has never used smokeless tobacco. He reports current alcohol use. He reports that he does not use drugs. family history includes Heart disease  in his father; Hyperlipidemia in his father; Hypertension in his father. Allergies  Allergen Reactions  . Ace Inhibitors     REACTION: tongue swelling   Current Outpatient Medications on File Prior to Visit  Medication Sig Dispense Refill  . acetaminophen (TYLENOL) 500 MG tablet Take 500 mg by mouth every 6 (six) hours as needed.    Marland Kitchen amLODipine (NORVASC) 5 MG tablet Take 1 tablet (5 mg total) by mouth daily. 90 tablet 1  . atorvastatin (LIPITOR) 40 MG tablet TAKE 1 TABLET BY MOUTH EVERY DAY 90 tablet 3  . azithromycin (ZITHROMAX Z-PAK) 250 MG tablet 2 tab by mouth day 1, then 1 per day 6 tablet 1  . cyclobenzaprine (FLEXERIL) 5 MG tablet TAKE 1 TABLET (5 MG TOTAL) BY MOUTH 3 (THREE) TIMES DAILY AS NEEDED FOR MUSCLE SPASMS. 60 tablet 1  . desloratadine (CLARINEX) 5 MG tablet Take 1 tablet (5 mg total) by mouth daily. Annual appt due in August must see provider for future refills 90 tablet 0  . Diclofenac Sodium (PENNSAID) 2 % SOLN Place 2 application onto the skin 2 (two) times daily. 112 g 3  . flintstones complete (FLINTSTONES) 60 MG chewable tablet Chew 1 tablet by mouth daily.      . irbesartan (AVAPRO) 150 MG tablet TAKE 1 TABLET BY MOUTH EVERY DAY 90 tablet 1  . Omega-3 Krill Oil 500 MG CAPS Take 1 capsule by mouth daily.    Marland Kitchen [place]  one time a day.    . pantoprazole (PROTONIX) 40 MG tablet Take 1 tablet (40 mg total) by mouth 2 (two) times daily. Please schedule a follow up appt for further refills.  Thank you 180 tablet 0  . sildenafil (VIAGRA) 100 MG tablet Take 0.5-1 tablets (50-100 mg total) by mouth daily as needed for erectile dysfunction. 5 tablet 11  . silodosin (RAPAFLO) 4 MG CAPS capsule Take 1 capsule (4 mg total) by mouth daily with breakfast. 60 capsule 11  . tadalafil (CIALIS) 20 MG tablet TAKE 1 TABLET BY MOUTH EVERY DAY AS NEEDED 6 tablet 9  . traMADol (ULTRAM) 50 MG tablet TAKE 1 TABLET BY MOUTH EVERY 6 HOURS AS NEEDED MODERATE  PAIN 120 tablet 2  . Turmeric 500 MG CAPS Take by mouth 2 (two) times daily.    . valsartan (DIOVAN) 160 MG tablet Take 160 mg by mouth daily.  3  . Vitamin D, Ergocalciferol, (DRISDOL) 1.25 MG (50000 UT) CAPS capsule TAKE 1 CAPSULE BY MOUTH ONE TIME PER WEEK 12 capsule 0   No current facility-administered medications on file prior to visit.   Review of Systems All otherwise neg per pt    Objective:   Physical Exam BP (!) 160/86 (BP Location: Left Arm, Patient Position: Sitting, Cuff Size: Large)   Pulse 86   Temp 98.3 F (36.8 C) (Oral)   Ht 5\' 9"  (1.753 m)   Wt 298 lb (135.2 kg)   SpO2 95%   BMI 44.01 kg/m  VS noted,  Constitutional: Pt appears in NAD HENT: Head: NCAT.  Right Ear: External ear normal.  Left Ear: External ear normal.  Eyes: . Pupils are equal, round, and reactive to light. Conjunctivae and EOM are normal Nose: without d/c or deformity Neck: Neck supple. Gross normal ROM Cardiovascular: Normal rate and regular rhythm.   Pulmonary/Chest: Effort normal and breath sounds without rales or wheezing.  Abd:  Soft, NT, ND, + BS, no organomegaly Neurological: Pt is alert. At baseline orientation, motor grossly intact Skin: Skin is warm. No rashes, other new lesions, 2+ bilat LE edema Psychiatric: Pt behavior is normal without agitation  All otherwise neg per pt Lab Results  Component Value Date   WBC 6.4 07/25/2019   HGB 12.8 (L) 07/25/2019   HCT 39.3 07/25/2019   PLT 198.0 07/25/2019   GLUCOSE 97 07/25/2019   CHOL 147 07/25/2019   TRIG 90.0 07/25/2019   HDL 52.20 07/25/2019   LDLDIRECT 157.6 01/02/2009   LDLCALC 77 07/25/2019   ALT 13 07/25/2019   AST 16 07/25/2019   NA 140 07/25/2019   K 4.3 07/25/2019   CL 106 07/25/2019   CREATININE 0.85 07/25/2019   BUN 18 07/25/2019   CO2 30 07/25/2019   TSH 0.88 07/25/2019   PSA 0.95 07/25/2019   HGBA1C 6.2 07/25/2019      Assessment & Plan:

## 2019-07-27 ENCOUNTER — Encounter: Payer: Self-pay | Admitting: Internal Medicine

## 2019-07-27 NOTE — Assessment & Plan Note (Signed)

## 2019-07-27 NOTE — Assessment & Plan Note (Addendum)
Mild to mod volume overload, for lasix restart at 20 bid for 1 wk, then 20 qd  I spent 31 minutes in addition to time for CPX wellness examination in preparing to see the patient by review of recent labs, imaging and procedures, obtaining and reviewing separately obtained history, communicating with the patient and family or caregiver, ordering medications, tests or procedures, and documenting clinical information in the EHR including the differential Dx, treatment, and any further evaluation and other management of cHF, htn, hyperglycemia, osa, muscle cramps

## 2019-07-27 NOTE — Assessment & Plan Note (Signed)
Due for f/u exam, refer pulm

## 2019-07-27 NOTE — Assessment & Plan Note (Signed)
stable overall by history and exam, recent data reviewed with pt, and pt to continue medical treatment as before,  to f/u any worsening symptoms or concerns  

## 2019-07-27 NOTE — Assessment & Plan Note (Signed)
For labs, also tizanidine 2 qhs and/or tonic h2o

## 2019-08-02 ENCOUNTER — Other Ambulatory Visit: Payer: Self-pay | Admitting: Internal Medicine

## 2019-08-02 NOTE — Telephone Encounter (Signed)
Done erx 

## 2019-08-12 ENCOUNTER — Encounter: Payer: Self-pay | Admitting: Internal Medicine

## 2019-08-16 ENCOUNTER — Other Ambulatory Visit: Payer: Self-pay | Admitting: Gastroenterology

## 2019-08-26 ENCOUNTER — Encounter: Payer: Self-pay | Admitting: Internal Medicine

## 2019-08-26 ENCOUNTER — Ambulatory Visit (INDEPENDENT_AMBULATORY_CARE_PROVIDER_SITE_OTHER): Payer: 59 | Admitting: Internal Medicine

## 2019-08-26 ENCOUNTER — Other Ambulatory Visit: Payer: Self-pay

## 2019-08-26 VITALS — BP 140/80 | HR 78 | Temp 98.2°F | Ht 69.0 in | Wt 297.0 lb

## 2019-08-26 DIAGNOSIS — I10 Essential (primary) hypertension: Secondary | ICD-10-CM

## 2019-08-26 DIAGNOSIS — R0600 Dyspnea, unspecified: Secondary | ICD-10-CM | POA: Diagnosis not present

## 2019-08-26 DIAGNOSIS — R739 Hyperglycemia, unspecified: Secondary | ICD-10-CM

## 2019-08-26 DIAGNOSIS — I5032 Chronic diastolic (congestive) heart failure: Secondary | ICD-10-CM

## 2019-08-26 MED ORDER — FUROSEMIDE 20 MG PO TABS: ORAL_TABLET | ORAL | 3 refills | Status: DC

## 2019-08-26 NOTE — Addendum Note (Signed)
Addended by: Biagio Borg on: 08/26/2019 08:43 PM   Modules accepted: Orders

## 2019-08-26 NOTE — Patient Instructions (Signed)
Ok to change the lasix to 20 mg in the AM, as well as 20 mg in the PM if needed for persistent swelling  Please continue all other medications as before, and refills have been done if requested.  Please have the pharmacy call with any other refills you may need.  Please continue your efforts at being more active, low cholesterol diet, and weight control.  Please keep your appointments with your specialists as you may have planned  Please make an Appointment to return in 6 months, or sooner if needed, also with Lab Appointment for testing done 3-5 days before at the [facility] (so this is for TWO appointments - please see the scheduling desk as you leave)

## 2019-08-26 NOTE — Assessment & Plan Note (Signed)
Improving overall,  to f/u any worsening symptoms or concerns

## 2019-08-26 NOTE — Assessment & Plan Note (Addendum)
Improved, but still volume overloaded, has some non compliance with med tx, ok for lasix 20 qam as well as 20 qpm prn persistent swelling  I spent 31 minutes in preparing to see the patient by review of recent labs, imaging and procedures, obtaining and reviewing separately obtained history, communicating with the patient and family or caregiver, ordering medications, tests or procedures, and documenting clinical information in the EHR including the differential Dx, treatment, and any further evaluation and other management of chf, htn, hyperglycemia, dyspnea

## 2019-08-26 NOTE — Progress Notes (Signed)
Subjective:    Patient ID: Joshua Ishihara Sr., male    DOB: 01/14/1951, 69 y.o.   MRN: [id number]  HPI  Here to f/u; overall doing ok,  Pt denies chest pain, increasing sob or doe, wheezing, orthopnea, PND, increased LE swelling, palpitations, dizziness or syncope.  Pt denies new neurological symptoms such as new headache, or facial or extremity weakness or numbness.  Pt denies polydipsia, polyuria, or low sugar episode.  Pt states overall good compliance with meds, mostly trying to follow appropriate diet, with wt overall stable,  but little exercise however.  Wt down at least 4 lbs (wearing 4 lb work boots today wasnt wearing last time) Wt Readings from Last 3 Encounters:  08/26/19 297 lb (134.7 kg)  07/25/19 298 lb (135.2 kg)  10/08/18 291 lb (132 kg)  leg cramps improved with the tonic water Past Medical History:  Diagnosis Date  . Acute pharyngitis 01/26/2009  . ALLERGIC RHINITIS 08/07/2007  . Allergy   . ARTHRITIS 08/07/2007  . BACK PAIN 09/15/2009  . BENIGN PROSTATIC HYPERTROPHY 05/20/2008  . Cataract   . EATING DISORDER, HX OF 08/07/2007  . ERECTILE DYSFUNCTION 02/18/2008  . HYPERLIPIDEMIA 08/07/2007  . HYPERTENSION 08/07/2007  . NECK PAIN, RIGHT 09/15/2009  . 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 SINUSITIS- ACUTE-NOS 04/30/2010  . Sleep apnea    Past Surgical History:  Procedure Laterality Date  . GASTRIC BYPASS  2006  . Left foot surgery  2008  . Lower back  2004  . Right knee arthroscopy  1994    reports that he has never smoked. He has never used smokeless tobacco. He reports current alcohol use. He reports that he does not use drugs. family history includes Heart disease in his father; Hyperlipidemia in his father; Hypertension in his father. Allergies  Allergen Reactions  . Ace Inhibitors     REACTION: tongue swelling   Current Outpatient Medications on File Prior to Visit  Medication Sig Dispense Refill  . acetaminophen (TYLENOL) 500 MG tablet Take 500 mg  by mouth every 6 (six) hours as needed.    Marland Kitchen amLODipine (NORVASC) 5 MG tablet Take 1 tablet (5 mg total) by mouth daily. 90 tablet 1  . atorvastatin (LIPITOR) 40 MG tablet TAKE 1 TABLET BY MOUTH EVERY DAY 90 tablet 3  . azithromycin (ZITHROMAX Z-PAK) 250 MG tablet 2 tab by mouth day 1, then 1 per day 6 tablet 1  . COVID-19 mRNA vaccine, Pfizer, (PFIZER-BIONTECH COVID-19 VACC) 30 MCG/0.3ML SUSP     . cyclobenzaprine (FLEXERIL) 5 MG tablet TAKE 1 TABLET (5 MG TOTAL) BY MOUTH 3 (THREE) TIMES DAILY AS NEEDED FOR MUSCLE SPASMS. 60 tablet 1  . desloratadine (CLARINEX) 5 MG tablet Take 1 tablet (5 mg total) by mouth daily. Annual appt due in August must see provider for future refills 90 tablet 0  . Diclofenac Sodium (PENNSAID) 2 % SOLN Place 2 application onto the skin 2 (two) times daily. 112 g 3  . flintstones complete (FLINTSTONES) 60 MG chewable tablet Chew 1 tablet by mouth daily.      . irbesartan (AVAPRO) 150 MG tablet TAKE 1 TABLET BY MOUTH EVERY DAY 90 tablet 1  . moxifloxacin (VIGAMOX) 0.5 % ophthalmic solution Place 1 drop into the left eye 4 (four) times daily.    . Omega-3 Krill Oil 500 MG CAPS Take 1 capsule by mouth daily.    Marland Kitchen OVER THE COUNTER MEDICATION Black Cherry Capsule one time a day.    Marland Kitchen  pantoprazole (PROTONIX) 40 MG tablet Take 1 tablet (40 mg total) by mouth 2 (two) times daily. Please schedule a follow up appt for further refills.  Thank you 180 tablet 0  . silodosin (RAPAFLO) 4 MG CAPS capsule Take 1 capsule (4 mg total) by mouth daily with breakfast. 60 capsule 11  . tadalafil (CIALIS) 20 MG tablet TAKE 1 TABLET BY MOUTH EVERY DAY AS NEEDED 6 tablet 9  . tamsulosin (FLOMAX) 0.4 MG CAPS capsule Take 0.8 mg by mouth daily.    Marland Kitchen tiZANidine (ZANAFLEX) 2 MG tablet Take 1 tablet (2 mg total) by mouth at bedtime. 30 tablet 05  . traMADol (ULTRAM) 50 MG tablet TAKE 1 TABLET BY MOUTH EVERY 6 HOURS AS NEEDED FOR MODERATE PAIN 120 tablet 1  . Turmeric 500 MG CAPS Take by mouth 2 (two)  times daily.    . valsartan (DIOVAN) 160 MG tablet Take 160 mg by mouth daily.  3  . Vitamin D, Ergocalciferol, (DRISDOL) 1.25 MG (50000 UT) CAPS capsule TAKE 1 CAPSULE BY MOUTH ONE TIME PER WEEK 12 capsule 0   No current facility-administered medications on file prior to visit.   Review of Systems All otherwise neg per pt     Objective:   Physical Exam BP 140/80 (BP Location: Left Arm, Patient Position: Sitting, Cuff Size: Large)   Pulse 78   Temp 98.2 F (36.8 C) (Oral)   Ht 5\' 9"  (1.753 m)   Wt 297 lb (134.7 kg)   SpO2 95%   BMI 43.86 kg/m  VS noted,  Constitutional: Pt appears in NAD HENT: Head: NCAT.  Right Ear: External ear normal.  Left Ear: External ear normal.  Eyes: . Pupils are equal, round, and reactive to light. Conjunctivae and EOM are normal Nose: without d/c or deformity Neck: Neck supple. Gross normal ROM Cardiovascular: Normal rate and regular rhythm.   Pulmonary/Chest: Effort normal and breath sounds without rales or wheezing.  Abd:  Soft, NT, ND, + BS, no organomegaly Neurological: Pt is alert. At baseline orientation, motor grossly intact Skin: Skin is warm. No rashes, other new lesions,1+ bilat LE edema Psychiatric: Pt behavior is normal without agitation  All otherwise neg per pt Lab Results  Component Value Date   WBC 6.4 07/25/2019   HGB 12.8 (L) 07/25/2019   HCT 39.3 07/25/2019   PLT 198.0 07/25/2019   GLUCOSE 97 07/25/2019   CHOL 147 07/25/2019   TRIG 90.0 07/25/2019   HDL 52.20 07/25/2019   LDLDIRECT 157.6 01/02/2009   LDLCALC 77 07/25/2019   ALT 13 07/25/2019   AST 16 07/25/2019   NA 140 07/25/2019   K 4.3 07/25/2019   CL 106 07/25/2019   CREATININE 0.85 07/25/2019   BUN 18 07/25/2019   CO2 30 07/25/2019   TSH 0.88 07/25/2019   PSA 0.95 07/25/2019   HGBA1C 6.2 07/25/2019      Assessment & Plan:

## 2019-08-26 NOTE — Assessment & Plan Note (Signed)
stable overall by history and exam, recent data reviewed with pt, and pt to continue medical treatment as before,  to f/u any worsening symptoms or concerns  

## 2019-09-06 ENCOUNTER — Other Ambulatory Visit: Payer: Self-pay

## 2019-09-06 NOTE — Telephone Encounter (Signed)
Tramadol requires a PA  Key: [id number]  LVM for pt informing that PA has been approved and to confirm that a refill was needed. Also stated to contact pharmacy to inform them that the PA has been approved.

## 2019-09-06 NOTE — Telephone Encounter (Signed)
New message   1.Medication Requested:traMADol (ULTRAM) 50 MG tablet  2. Pharmacy ([place], [place], [place]):[id number] - [place], [place] [address]  3. On Med List: Yes   4. Last Visit with PCP: 5.20.21  5. Next visit date with PCP: n/a   Agent: Please be advised that RX refills may take up to 3 business days. We ask that you follow-up with your pharmacy.

## 2019-09-12 ENCOUNTER — Telehealth: Payer: Self-pay

## 2019-09-12 NOTE — Telephone Encounter (Signed)
Key: [id number]  PA has been approved.   Fax sent to pharmacy informing of same.

## 2019-10-03 ENCOUNTER — Other Ambulatory Visit: Payer: Self-pay

## 2019-10-03 ENCOUNTER — Ambulatory Visit (INDEPENDENT_AMBULATORY_CARE_PROVIDER_SITE_OTHER): Payer: 59 | Admitting: Pulmonary Disease

## 2019-10-03 ENCOUNTER — Encounter: Payer: Self-pay | Admitting: Pulmonary Disease

## 2019-10-03 VITALS — BP 120/76 | HR 87 | Temp 98.6°F | Ht 71.0 in | Wt 298.2 lb

## 2019-10-03 DIAGNOSIS — G4733 Obstructive sleep apnea (adult) (pediatric): Secondary | ICD-10-CM

## 2019-10-03 DIAGNOSIS — Z87898 Personal history of other specified conditions: Secondary | ICD-10-CM

## 2019-10-03 NOTE — Addendum Note (Signed)
Addended by: Vanessa Barbara on: 10/03/2019 03:38 PM   Modules accepted: Orders

## 2019-10-03 NOTE — Progress Notes (Signed)
Joshua Sankey Sr.    [id number]    12/31/1950  Primary Care Physician:[name], [name], MD  Referring Physician: Biagio Borg, MD [address] [place],  [place] [address]  Chief complaint:   History of obstructive sleep apnea Stopped using CPAP with weight loss, symptoms have recurred with brain gain  HPI:  Patient with a history of obstructive sleep apnea Had bypass surgery and was able to lose a lot of weight Stopped using CPAP at the time  Symptoms have started since he has regained some of the weight  History of snoring, restless sleep Witnessed apneas by spouse  Usually goes to bed between 9 and 10 PM Falls asleep in 5 to 10 minutes Several awakenings  Final wake up time about 4:10 AM Weight is back up by about 40 pounds  He is tired and sleepy during the day  Sometimes does take naps during the day  No family history of obstructive sleep apnea  Outpatient Encounter Medications as of 10/03/2019  Medication Sig  . acetaminophen (TYLENOL) 500 MG tablet Take 500 mg by mouth every 6 (six) hours as needed.  Marland Kitchen amLODipine (NORVASC) 5 MG tablet Take 1 tablet (5 mg total) by mouth daily.  Marland Kitchen atorvastatin (LIPITOR) 40 MG tablet TAKE 1 TABLET BY MOUTH EVERY DAY  . COVID-19 mRNA vaccine, Pfizer, (PFIZER-BIONTECH COVID-19 VACC) 30 MCG/0.3ML SUSP   . cyclobenzaprine (FLEXERIL) 5 MG tablet TAKE 1 TABLET (5 MG TOTAL) BY MOUTH 3 (THREE) TIMES DAILY AS NEEDED FOR MUSCLE SPASMS.  Marland Kitchen desloratadine (CLARINEX) 5 MG tablet Take 1 tablet (5 mg total) by mouth daily. Annual appt due in August must see provider for future refills  . Diclofenac Sodium (PENNSAID) 2 % SOLN Place 2 application onto the skin 2 (two) times daily.  . flintstones complete (FLINTSTONES) 60 MG chewable tablet Chew 1 tablet by mouth daily.    . furosemide (LASIX) 20 MG tablet 1 tab by mouth in the AM, and 1 in the PM as needed for persistent swelling  . irbesartan (AVAPRO) 150 MG tablet TAKE 1 TABLET  BY MOUTH EVERY DAY  . moxifloxacin (VIGAMOX) 0.5 % ophthalmic solution Place 1 drop into the left eye 4 (four) times daily.  . Omega-3 Krill Oil 500 MG CAPS Take 1 capsule by mouth daily.  Marland Kitchen OVER THE COUNTER MEDICATION [place] Capsule one time a day.  . pantoprazole (PROTONIX) 40 MG tablet Take 1 tablet (40 mg total) by mouth 2 (two) times daily. Please schedule a follow up appt for further refills.  Thank you  . silodosin (RAPAFLO) 4 MG CAPS capsule Take 1 capsule (4 mg total) by mouth daily with breakfast.  . tadalafil (CIALIS) 20 MG tablet TAKE 1 TABLET BY MOUTH EVERY DAY AS NEEDED  . tamsulosin (FLOMAX) 0.4 MG CAPS capsule Take 0.8 mg by mouth daily.  Marland Kitchen tiZANidine (ZANAFLEX) 2 MG tablet Take 1 tablet (2 mg total) by mouth at bedtime.  . traMADol (ULTRAM) 50 MG tablet TAKE 1 TABLET BY MOUTH EVERY 6 HOURS AS NEEDED FOR MODERATE PAIN  . Turmeric 500 MG CAPS Take by mouth 2 (two) times daily.  . valsartan (DIOVAN) 160 MG tablet Take 160 mg by mouth daily.  . Vitamin D, Ergocalciferol, (DRISDOL) 1.25 MG (50000 UT) CAPS capsule TAKE 1 CAPSULE BY MOUTH ONE TIME PER WEEK  . [DISCONTINUED] azithromycin (ZITHROMAX Z-PAK) 250 MG tablet 2 tab by mouth day 1, then 1 per day (Patient  not taking: Reported on 10/03/2019)   No facility-administered encounter medications on file as of 10/03/2019.    Allergies as of 10/03/2019 - Review Complete 10/03/2019  Allergen Reaction Noted  . Ace inhibitors  08/07/2007    Past Medical History:  Diagnosis Date  . Acute pharyngitis 01/26/2009  . ALLERGIC RHINITIS 08/07/2007  . Allergy   . ARTHRITIS 08/07/2007  . BACK PAIN 09/15/2009  . BENIGN PROSTATIC HYPERTROPHY 05/20/2008  . Cataract   . EATING DISORDER, HX OF 08/07/2007  . ERECTILE DYSFUNCTION 02/18/2008  . HYPERLIPIDEMIA 08/07/2007  . HYPERTENSION 08/07/2007  . NECK PAIN, RIGHT 09/15/2009  . 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 SINUSITIS- ACUTE-NOS 04/30/2010  . Sleep apnea     Past Surgical  History:  Procedure Laterality Date  . GASTRIC BYPASS  2006  . Left foot surgery  2008  . Lower back  2004  . Right knee arthroscopy  1994    Family History  Problem Relation Age of Onset  . Hyperlipidemia Father   . Heart disease Father   . Hypertension Father   . Colon polyps Neg Hx   . Esophageal cancer Neg Hx   . Rectal cancer Neg Hx   . Stomach cancer Neg Hx   . Pancreatic cancer Neg Hx   . Colon cancer Neg Hx     Social History   Socioeconomic History  . Marital status: Married    Spouse name: Not on file  . Number of children: Not on file  . Years of education: Not on file  . Highest education level: Not on file  Occupational History  . Not on file  Tobacco Use  . Smoking status: Never Smoker  . Smokeless tobacco: Never Used  Vaping Use  . Vaping Use: Never used  Substance and Sexual Activity  . Alcohol use: Yes    Comment: occasional beer   . Drug use: No  . Sexual activity: Not on file  Other Topics Concern  . Not on file  Social History Narrative  . Not on file   Social Determinants of Health   Financial Resource Strain:   . Difficulty of Paying Living Expenses:   Food Insecurity:   . Worried About Charity fundraiser in the Last Year:   . Arboriculturist in the Last Year:   Transportation Needs:   . Film/video editor (Medical):   Marland Kitchen Lack of Transportation (Non-Medical):   Physical Activity:   . Days of Exercise per Week:   . Minutes of Exercise per Session:   Stress:   . Feeling of Stress :   Social Connections:   . Frequency of Communication with Friends and Family:   . Frequency of Social Gatherings with Friends and Family:   . Attends Religious Services:   . Active Member of Clubs or Organizations:   . Attends Archivist Meetings:   Marland Kitchen Marital Status:   Intimate Partner Violence:   . Fear of Current or Ex-Partner:   . Emotionally Abused:   Marland Kitchen Physically Abused:   . Sexually Abused:     Review of Systems    Respiratory: Positive for apnea.   Psychiatric/Behavioral: Positive for sleep disturbance.  All other systems reviewed and are negative.   Vitals:   10/03/19 1500  BP: 120/76  Pulse: 87  Temp: 98.6 F (37 C)  SpO2: 97%     Physical Exam Constitutional:      Appearance: He is obese.  HENT:  Head: Normocephalic.     Mouth/Throat:     Mouth: Mucous membranes are moist.     Comments: Mallampati 4, crowded oropharynx Eyes:     General:        Right eye: No discharge.        Left eye: No discharge.     Pupils: Pupils are equal, round, and reactive to light.  Cardiovascular:     Rate and Rhythm: Normal rate and regular rhythm.     Pulses: Normal pulses.     Heart sounds: Normal heart sounds. No murmur heard.  No friction rub.  Pulmonary:     Effort: Pulmonary effort is normal. No respiratory distress.     Breath sounds: Normal breath sounds. No stridor. No wheezing or rhonchi.  Musculoskeletal:     Cervical back: No rigidity or tenderness.  Neurological:     General: No focal deficit present.     Mental Status: He is alert.  Psychiatric:        Mood and Affect: Mood normal.   No flowsheet data found.  Epworth Sleepiness Scale of 12 today Assessment:  History of obstructive sleep apnea  Excessive daytime sleepiness  Pathophysiology of sleep disordered breathing reviewed Treatment options reviewed  Plan/Recommendations:  Schedule patient for home sleep study  Weight loss efforts encouraged  Encouraged to call with any significant concerns  Follow-up in 3 months   Sherrilyn Rist MD [facility] 10/03/2019, 3:28 PM  CC: Biagio Borg, MD

## 2019-10-03 NOTE — Patient Instructions (Signed)
History of obstructive sleep apnea Recurrence of symptoms with weight gain  We will schedule you for home sleep study Update you with results once reviewed  Start you on CPAP therapy  We will see you back in 3 months  Call with significant concerns

## 2019-10-03 NOTE — Progress Notes (Signed)
Patient ID: Joshua Ishihara Sr., male   DOB: 03/09/1950, 69 y.o.   MRN: [id number] Medical screening examination/treatment/procedure(s) were performed by non-physician practitioner and as supervising physician I was immediately available for consultation/collaboration. I agree with above. Cathlean Cower, MD

## 2019-10-28 ENCOUNTER — Other Ambulatory Visit: Payer: Self-pay

## 2019-10-28 DIAGNOSIS — G4733 Obstructive sleep apnea (adult) (pediatric): Secondary | ICD-10-CM

## 2019-10-28 DIAGNOSIS — Z87898 Personal history of other specified conditions: Secondary | ICD-10-CM

## 2019-10-30 DIAGNOSIS — G4733 Obstructive sleep apnea (adult) (pediatric): Secondary | ICD-10-CM | POA: Diagnosis not present

## 2019-10-31 ENCOUNTER — Telehealth: Payer: Self-pay | Admitting: Pulmonary Disease

## 2019-10-31 DIAGNOSIS — G4733 Obstructive sleep apnea (adult) (pediatric): Secondary | ICD-10-CM | POA: Diagnosis not present

## 2019-10-31 NOTE — Telephone Encounter (Signed)
Patient contacted with results of home sleep study. Patient agrees to start CPAP therapy, DME order placed, follow up recall in. Patient verbalized understanding of results and ongoing plan of care.

## 2019-10-31 NOTE — Telephone Encounter (Signed)
Call patient  Sleep study result  Date of study: 10/28/2019  Impression: Moderate obstructive sleep apnea Mild oxygen desaturations  Recommendation: DME referral  Recommend CPAP therapy for moderate obstructive sleep apnea  Auto titrating CPAP with pressure settings of 5-15 will be appropriate  Encourage weight loss measures  Follow-up in the office 4 to 6 weeks following initiation of treatment

## 2019-11-06 ENCOUNTER — Other Ambulatory Visit: Payer: Self-pay | Admitting: Internal Medicine

## 2019-11-09 ENCOUNTER — Other Ambulatory Visit: Payer: Self-pay | Admitting: Internal Medicine

## 2019-11-09 NOTE — Telephone Encounter (Signed)
Please refill as per office routine med refill policy (all routine meds refilled for 3 mo or monthly per pt preference up to one year from last visit, then month to month grace period for 3 mo, then further med refills will have to be denied)  

## 2019-11-13 ENCOUNTER — Other Ambulatory Visit: Payer: Self-pay | Admitting: Gastroenterology

## 2019-12-23 ENCOUNTER — Other Ambulatory Visit: Payer: Self-pay | Admitting: Internal Medicine

## 2019-12-30 ENCOUNTER — Other Ambulatory Visit: Payer: Self-pay

## 2019-12-30 ENCOUNTER — Ambulatory Visit (INDEPENDENT_AMBULATORY_CARE_PROVIDER_SITE_OTHER): Payer: 59 | Admitting: Internal Medicine

## 2019-12-30 ENCOUNTER — Encounter: Payer: Self-pay | Admitting: Internal Medicine

## 2019-12-30 VITALS — BP 140/80 | HR 72 | Temp 98.0°F | Ht 71.0 in | Wt 296.0 lb

## 2019-12-30 DIAGNOSIS — G5603 Carpal tunnel syndrome, bilateral upper limbs: Secondary | ICD-10-CM | POA: Diagnosis not present

## 2019-12-30 DIAGNOSIS — Z23 Encounter for immunization: Secondary | ICD-10-CM

## 2019-12-30 DIAGNOSIS — I10 Essential (primary) hypertension: Secondary | ICD-10-CM

## 2019-12-30 DIAGNOSIS — E785 Hyperlipidemia, unspecified: Secondary | ICD-10-CM

## 2019-12-30 DIAGNOSIS — R739 Hyperglycemia, unspecified: Secondary | ICD-10-CM | POA: Diagnosis not present

## 2019-12-30 NOTE — Progress Notes (Signed)
Subjective:    Patient ID: Joshua Ishihara Sr., male    DOB: 1950/06/07, 69 y.o.   MRN: [id number]  HPI   Here with c/o 3 wks onset bilateral hand numbness and mild discomfort at night, sometimes worse in the am, gets better during day, no other pain or weakness, but types all day for work  Pt denies chest pain, increased sob or doe, wheezing, orthopnea, PND, increased LE swelling, palpitations, dizziness or syncope.  Pt denies new neurological symptoms such as new headache, or facial or extremity weakness or numbness   Pt denies polydipsia, polyuria  Pt denies fever, wt loss, night sweats, loss of appetite, or other constitutional symptoms Past Medical History:  Diagnosis Date  . Acute pharyngitis 01/26/2009  . ALLERGIC RHINITIS 08/07/2007  . Allergy   . ARTHRITIS 08/07/2007  . BACK PAIN 09/15/2009  . BENIGN PROSTATIC HYPERTROPHY 05/20/2008  . Cataract   . EATING DISORDER, HX OF 08/07/2007  . ERECTILE DYSFUNCTION 02/18/2008  . HYPERLIPIDEMIA 08/07/2007  . HYPERTENSION 08/07/2007  . NECK PAIN, RIGHT 09/15/2009  . 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 SINUSITIS- ACUTE-NOS 04/30/2010  . Sleep apnea    Past Surgical History:  Procedure Laterality Date  . GASTRIC BYPASS  2006  . Left foot surgery  2008  . Lower back  2004  . Right knee arthroscopy  1994    reports that he has never smoked. He has never used smokeless tobacco. He reports current alcohol use. He reports that he does not use drugs. family history includes Heart disease in his father; Hyperlipidemia in his father; Hypertension in his father. Allergies  Allergen Reactions  . Ace Inhibitors     REACTION: tongue swelling   Current Outpatient Medications on File Prior to Visit  Medication Sig Dispense Refill  . acetaminophen (TYLENOL) 500 MG tablet Take 500 mg by mouth every 6 (six) hours as needed.    Marland Kitchen amLODipine (NORVASC) 5 MG tablet Take 1 tablet (5 mg total) by mouth daily. 90 tablet 1  . atorvastatin (LIPITOR) 40  MG tablet TAKE 1 TABLET BY MOUTH EVERY DAY 90 tablet 3  . COVID-19 mRNA vaccine, Pfizer, (PFIZER-BIONTECH COVID-19 VACC) 30 MCG/0.3ML SUSP     . cyclobenzaprine (FLEXERIL) 5 MG tablet TAKE 1 TABLET (5 MG TOTAL) BY MOUTH 3 (THREE) TIMES DAILY AS NEEDED FOR MUSCLE SPASMS. 60 tablet 1  . desloratadine (CLARINEX) 5 MG tablet TAKE 1 TABLET (5 MG TOTAL) BY MOUTH DAILY. ANNUAL APPT DUE IN AUGUST MUST SEE PROVIDER FOR FUTURE REFILLS 90 tablet 0  . Diclofenac Sodium (PENNSAID) 2 % SOLN Place 2 application onto the skin 2 (two) times daily. 112 g 3  . flintstones complete (FLINTSTONES) 60 MG chewable tablet Chew 1 tablet by mouth daily.      . furosemide (LASIX) 20 MG tablet 1 tab by mouth in the AM, and 1 in the PM as needed for persistent swelling 180 tablet 3  . irbesartan (AVAPRO) 150 MG tablet TAKE 1 TABLET BY MOUTH EVERY DAY 90 tablet 1  . moxifloxacin (VIGAMOX) 0.5 % ophthalmic solution Place 1 drop into the left eye 4 (four) times daily.    . Omega-3 Krill Oil 500 MG CAPS Take 1 capsule by mouth daily.    Marland Kitchen OVER THE COUNTER MEDICATION Black Cherry Capsule one time a day.    . pantoprazole (PROTONIX) 40 MG tablet Take 1 tablet (40 mg total) by mouth 2 (two) times daily. Please schedule a follow up appt for further  refills.  Thank you 180 tablet 0  . silodosin (RAPAFLO) 4 MG CAPS capsule Take 1 capsule (4 mg total) by mouth daily with breakfast. 60 capsule 11  . tadalafil (CIALIS) 20 MG tablet TAKE 1 TABLET BY MOUTH EVERY DAY AS NEEDED 6 tablet 9  . tamsulosin (FLOMAX) 0.4 MG CAPS capsule Take 0.8 mg by mouth daily.    Marland Kitchen tiZANidine (ZANAFLEX) 2 MG tablet Take 1 tablet (2 mg total) by mouth at bedtime. 30 tablet 05  . traMADol (ULTRAM) 50 MG tablet TAKE 1 TABLET BY MOUTH EVERY 6 HOURS AS NEEDED FOR PAIN (MODERATE PAIN) 120 tablet 1  . Turmeric 500 MG CAPS Take by mouth 2 (two) times daily.    . valsartan (DIOVAN) 160 MG tablet Take 160 mg by mouth daily.  3   No current facility-administered  medications on file prior to visit.   Review of Systems All otherwise neg per pt    Objective:   Physical Exam BP 140/80 (BP Location: Left Arm, Patient Position: Sitting, Cuff Size: Large)   Pulse 72   Temp 98 F (36.7 C) (Oral)   Ht 5\' 11"  (1.803 m)   Wt 296 lb (134.3 kg)   SpO2 95%   BMI 41.28 kg/m  VS noted,  Constitutional: Pt appears in NAD HENT: Head: NCAT.  Right Ear: External ear normal.  Left Ear: External ear normal.  Eyes: . Pupils are equal, round, and reactive to light. Conjunctivae and EOM are normal Nose: without d/c or deformity Neck: Neck supple. Gross normal ROM Cardiovascular: Normal rate and regular rhythm.   Pulmonary/Chest: Effort normal and breath sounds without rales or wheezing.  Abd:  Soft, NT, ND, + BS, no organomegaly Neurological: Pt is alert. At baseline orientation, motor grossly intact Skin: Skin is warm. No rashes, other new lesions, no LE edema Psychiatric: Pt behavior is normal without agitation  All otherwise neg per pt Lab Results  Component Value Date   WBC 6.4 07/25/2019   HGB 12.8 (L) 07/25/2019   HCT 39.3 07/25/2019   PLT 198.0 07/25/2019   GLUCOSE 97 07/25/2019   CHOL 147 07/25/2019   TRIG 90.0 07/25/2019   HDL 52.20 07/25/2019   LDLDIRECT 157.6 01/02/2009   LDLCALC 77 07/25/2019   ALT 13 07/25/2019   AST 16 07/25/2019   NA 140 07/25/2019   K 4.3 07/25/2019   CL 106 07/25/2019   CREATININE 0.85 07/25/2019   BUN 18 07/25/2019   CO2 30 07/25/2019   TSH 0.88 07/25/2019   PSA 0.95 07/25/2019   HGBA1C 6.2 07/25/2019      Assessment & Plan:

## 2019-12-30 NOTE — Patient Instructions (Addendum)
You had the flu shot today  Ok to try the left and right wrist splints at night  Please call in 1 wk for referral to hand surgury if not improved  Please continue all other medications as before, and refills have been done if requested.  Please have the pharmacy call with any other refills you may need.  Please continue your efforts at being more active, low cholesterol diet, and weight control.  Please keep your appointments with your specialists as you may have planned

## 2019-12-31 ENCOUNTER — Encounter: Payer: Self-pay | Admitting: Internal Medicine

## 2019-12-31 DIAGNOSIS — G5603 Carpal tunnel syndrome, bilateral upper limbs: Secondary | ICD-10-CM | POA: Insufficient documentation

## 2019-12-31 DIAGNOSIS — Z23 Encounter for immunization: Secondary | ICD-10-CM | POA: Insufficient documentation

## 2019-12-31 NOTE — Assessment & Plan Note (Signed)
Exam benign, ok for wrist splints at night

## 2019-12-31 NOTE — Assessment & Plan Note (Signed)
stable overall by history and exam, recent data reviewed with pt, and pt to continue medical treatment as before,  to f/u any worsening symptoms or concerns  

## 2020-01-06 ENCOUNTER — Encounter: Payer: Self-pay | Admitting: Pulmonary Disease

## 2020-01-06 ENCOUNTER — Other Ambulatory Visit: Payer: Self-pay

## 2020-01-06 ENCOUNTER — Ambulatory Visit (INDEPENDENT_AMBULATORY_CARE_PROVIDER_SITE_OTHER): Payer: 59 | Admitting: Pulmonary Disease

## 2020-01-06 VITALS — BP 130/70 | HR 71 | Temp 97.9°F | Ht 71.0 in | Wt 299.4 lb

## 2020-01-06 DIAGNOSIS — G4733 Obstructive sleep apnea (adult) (pediatric): Secondary | ICD-10-CM

## 2020-01-06 DIAGNOSIS — Z9989 Dependence on other enabling machines and devices: Secondary | ICD-10-CM | POA: Diagnosis not present

## 2020-01-06 NOTE — Patient Instructions (Signed)
Moderate obstructive sleep apnea-well treated with CPAP therapy  Continue using CPAP nightly  We will send a prescription to your medical supply company for CPAP supplies-try different size mask  Follow-up in 6 months  Call with significant concerns

## 2020-01-06 NOTE — Progress Notes (Signed)
Joshua Burnett.    [id number]    17-Feb-1951  Primary Care Physician:[name], [name], MD  Referring Physician: Biagio Borg, MD [address] [place],  [place] [address]  Chief complaint:   History of obstructive sleep apnea Had a repeat study showing moderate obstructive sleep apnea  HPI: Has been compliant with CPAP Feels better with CPAP use  He does have some dryness of his mouth in the mornings Does not have a lot of mask leak  Denies any pain or discomfort His health has been generally well since the last visit  History of bypass surgery and significant weight loss  Symptoms have started since he has regained some of the weight  History of snoring, restless sleep Witnessed apneas by spouse  Usually goes to bed between 9 and 10 PM Falls asleep in 5 to 10 minutes Several awakenings  Final wake up time about 4:10 AM Weight is back up by about 40 pounds  He is tired and sleepy during the day  Sometimes does take naps during the day  No family history of obstructive sleep apnea  Outpatient Encounter Medications as of 01/06/2020  Medication Sig  . acetaminophen (TYLENOL) 500 MG tablet Take 500 mg by mouth every 6 (six) hours as needed.  Marland Kitchen amLODipine (NORVASC) 5 MG tablet Take 1 tablet (5 mg total) by mouth daily.  Marland Kitchen atorvastatin (LIPITOR) 40 MG tablet TAKE 1 TABLET BY MOUTH EVERY DAY  . COVID-19 mRNA vaccine, Pfizer, (PFIZER-BIONTECH COVID-19 VACC) 30 MCG/0.3ML SUSP   . cyclobenzaprine (FLEXERIL) 5 MG tablet TAKE 1 TABLET (5 MG TOTAL) BY MOUTH 3 (THREE) TIMES DAILY AS NEEDED FOR MUSCLE SPASMS.  Marland Kitchen desloratadine (CLARINEX) 5 MG tablet TAKE 1 TABLET (5 MG TOTAL) BY MOUTH DAILY. ANNUAL APPT DUE IN AUGUST MUST SEE PROVIDER FOR FUTURE REFILLS  . Diclofenac Sodium (PENNSAID) 2 % SOLN Place 2 application onto the skin 2 (two) times daily.  . flintstones complete (FLINTSTONES) 60 MG chewable tablet Chew 1 tablet by mouth daily.    . furosemide (LASIX) 20  MG tablet 1 tab by mouth in the AM, and 1 in the PM as needed for persistent swelling  . irbesartan (AVAPRO) 150 MG tablet TAKE 1 TABLET BY MOUTH EVERY DAY  . moxifloxacin (VIGAMOX) 0.5 % ophthalmic solution Place 1 drop into the left eye 4 (four) times daily.  . Omega-3 Krill Oil 500 MG CAPS Take 1 capsule by mouth daily.  Marland Kitchen OVER THE COUNTER MEDICATION Black Cherry Capsule one time a day.  . pantoprazole (PROTONIX) 40 MG tablet Take 1 tablet (40 mg total) by mouth 2 (two) times daily. Please schedule a follow up appt for further refills.  Thank you  . silodosin (RAPAFLO) 4 MG CAPS capsule Take 1 capsule (4 mg total) by mouth daily with breakfast.  . tadalafil (CIALIS) 20 MG tablet TAKE 1 TABLET BY MOUTH EVERY DAY AS NEEDED  . tamsulosin (FLOMAX) 0.4 MG CAPS capsule Take 0.8 mg by mouth daily.  Marland Kitchen tiZANidine (ZANAFLEX) 2 MG tablet Take 1 tablet (2 mg total) by mouth at bedtime.  . traMADol (ULTRAM) 50 MG tablet TAKE 1 TABLET BY MOUTH EVERY 6 HOURS AS NEEDED FOR PAIN (MODERATE PAIN)  . Turmeric 500 MG CAPS Take by mouth 2 (two) times daily.  . valsartan (DIOVAN) 160 MG tablet Take 160 mg by mouth daily.   No facility-administered encounter medications on file as of 01/06/2020.    Allergies  as of 01/06/2020 - Review Complete 12/31/2019  Allergen Reaction Noted  . Ace inhibitors  08/07/2007    Past Medical History:  Diagnosis Date  . Acute pharyngitis 01/26/2009  . ALLERGIC RHINITIS 08/07/2007  . Allergy   . ARTHRITIS 08/07/2007  . BACK PAIN 09/15/2009  . BENIGN PROSTATIC HYPERTROPHY 05/20/2008  . Cataract   . EATING DISORDER, HX OF 08/07/2007  . ERECTILE DYSFUNCTION 02/18/2008  . HYPERLIPIDEMIA 08/07/2007  . HYPERTENSION 08/07/2007  . NECK PAIN, RIGHT 09/15/2009  . 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 SINUSITIS- ACUTE-NOS 04/30/2010  . Sleep apnea     Past Surgical History:  Procedure Laterality Date  . GASTRIC BYPASS  2006  . Left foot surgery  2008  . Lower back  2004  . Right  knee arthroscopy  1994    Family History  Problem Relation Age of Onset  . Hyperlipidemia Father   . Heart disease Father   . Hypertension Father   . Colon polyps Neg Hx   . Esophageal cancer Neg Hx   . Rectal cancer Neg Hx   . Stomach cancer Neg Hx   . Pancreatic cancer Neg Hx   . Colon cancer Neg Hx     Social History   Socioeconomic History  . Marital status: Married    Spouse name: Not on file  . Number of children: Not on file  . Years of education: Not on file  . Highest education level: Not on file  Occupational History  . Not on file  Tobacco Use  . Smoking status: Never Smoker  . Smokeless tobacco: Never Used  Vaping Use  . Vaping Use: Never used  Substance and Sexual Activity  . Alcohol use: Yes    Comment: occasional beer   . Drug use: No  . Sexual activity: Not on file  Other Topics Concern  . Not on file  Social History Narrative  . Not on file   Social Determinants of Health   Financial Resource Strain:   . Difficulty of Paying Living Expenses: Not on file  Food Insecurity:   . Worried About Charity fundraiser in the Last Year: Not on file  . Ran Out of Food in the Last Year: Not on file  Transportation Needs:   . Lack of Transportation (Medical): Not on file  . Lack of Transportation (Non-Medical): Not on file  Physical Activity:   . Days of Exercise per Week: Not on file  . Minutes of Exercise per Session: Not on file  Stress:   . Feeling of Stress : Not on file  Social Connections:   . Frequency of Communication with Friends and Family: Not on file  . Frequency of Social Gatherings with Friends and Family: Not on file  . Attends Religious Services: Not on file  . Active Member of Clubs or Organizations: Not on file  . Attends Archivist Meetings: Not on file  . Marital Status: Not on file  Intimate Partner Violence:   . Fear of Current or Ex-Partner: Not on file  . Emotionally Abused: Not on file  . Physically Abused: Not  on file  . Sexually Abused: Not on file    Review of Systems  Respiratory: Positive for apnea.   Psychiatric/Behavioral: Positive for sleep disturbance.  All other systems reviewed and are negative.   Vitals:   01/06/20 1549  BP: 130/70  Pulse: 71  Temp: 97.9 F (36.6 C)  SpO2: 98%     Physical Exam  Constitutional:      Appearance: He is obese.  HENT:     Mouth/Throat:     Comments: Mallampati 4, crowded oropharynx Eyes:     General:        Right eye: No discharge.        Left eye: No discharge.     Pupils: Pupils are equal, round, and reactive to light.  Cardiovascular:     Rate and Rhythm: Normal rate and regular rhythm.     Pulses: Normal pulses.     Heart sounds: Normal heart sounds. No murmur heard.  No friction rub.  Pulmonary:     Effort: Pulmonary effort is normal. No respiratory distress.     Breath sounds: Normal breath sounds. No stridor. No wheezing or rhonchi.  Musculoskeletal:     Cervical back: No rigidity or tenderness.  Neurological:     General: No focal deficit present.     Mental Status: He is alert.  Psychiatric:        Mood and Affect: Mood normal.    Results of the Epworth flowsheet 01/06/2020  Sitting and reading 1  Watching TV 1  Sitting, inactive in a public place (e.g. a theatre or a meeting) 1  As a passenger in a car for an hour without a break 1  Lying down to rest in the afternoon when circumstances permit 1  Sitting and talking to someone 1  Sitting quietly after a lunch without alcohol 1  In a car, while stopped for a few minutes in traffic 0  Total score 7    Epworth Sleepiness Scale of 12 today  Compliance data shows 93% compliance 5-15 setting Residual AHI of 2.4 95 percentile pressure of 14.1  Assessment:  History of obstructive sleep apnea -Moderate obstructive sleep apnea on recent study  Excessive daytime sleepiness -Significant improvement  Plan/Recommendations: Continue CPAP on a regular  basis  Continue weight loss efforts  Encouraged to call with significant concerns  Follow-up in 6 months  DME prescription for CPAP supplies to try different size mask   Sherrilyn Rist MD [facility] 01/06/2020, 4:08 PM  CC: Biagio Borg, MD

## 2020-01-15 ENCOUNTER — Other Ambulatory Visit: Payer: Self-pay | Admitting: Internal Medicine

## 2020-02-04 ENCOUNTER — Other Ambulatory Visit: Payer: Self-pay | Admitting: Internal Medicine

## 2020-02-07 ENCOUNTER — Other Ambulatory Visit: Payer: Self-pay | Admitting: Internal Medicine

## 2020-02-07 NOTE — Telephone Encounter (Signed)
Please refill as per office routine med refill policy (all routine meds refilled for 3 mo or monthly per pt preference up to one year from last visit, then month to month grace period for 3 mo, then further med refills will have to be denied)  

## 2020-04-20 ENCOUNTER — Encounter: Payer: Self-pay | Admitting: Gastroenterology

## 2020-04-30 ENCOUNTER — Encounter: Payer: Self-pay | Admitting: Gastroenterology

## 2020-05-04 ENCOUNTER — Other Ambulatory Visit: Payer: Self-pay | Admitting: Internal Medicine

## 2020-05-04 NOTE — Telephone Encounter (Signed)
Please refill as per office routine med refill policy (all routine meds refilled for 3 mo or monthly per pt preference up to one year from last visit, then month to month grace period for 3 mo, then further med refills will have to be denied)  

## 2020-05-05 ENCOUNTER — Other Ambulatory Visit: Payer: Self-pay | Admitting: Internal Medicine

## 2020-05-05 NOTE — Telephone Encounter (Signed)
Please refill as per office routine med refill policy (all routine meds refilled for 3 mo or monthly per pt preference up to one year from last visit, then month to month grace period for 3 mo, then further med refills will have to be denied)  

## 2020-06-02 ENCOUNTER — Encounter: Payer: Self-pay | Admitting: Internal Medicine

## 2020-06-05 ENCOUNTER — Other Ambulatory Visit: Payer: Self-pay

## 2020-06-05 ENCOUNTER — Encounter: Payer: Self-pay | Admitting: Gastroenterology

## 2020-06-05 ENCOUNTER — Ambulatory Visit (AMBULATORY_SURGERY_CENTER): Payer: Self-pay | Admitting: *Deleted

## 2020-06-05 VITALS — Ht 71.0 in | Wt 285.0 lb

## 2020-06-05 DIAGNOSIS — Z86010 Personal history of colonic polyps: Secondary | ICD-10-CM

## 2020-06-05 MED ORDER — SUTAB 1479-225-188 MG PO TABS: 24.0000 | ORAL_TABLET | ORAL | 0 refills | Status: DC

## 2020-06-05 NOTE — Progress Notes (Signed)
No egg or soy allergy known to patient  No issues with past sedation with any surgeries or procedures Patient denies ever being told they had issues or difficulty with intubation  No FH of Malignant Hyperthermia No diet pills per patient No home 02 use per patient  No blood thinners per patient  Pt denies issues with constipation  No A fib or A flutter  EMMI video to pt or via [place] guidelines implemented in [place] today with Pt and RN  Pt is fully vaccinated  for [employer] given to pt in [facility] today , Code to Pharmacy and  NO PA's for preps discussed with pt In PV today  Discussed with pt there will be an out-of-pocket cost for prep and that varies from $0 to 70 dollars   Due to the COVID-19 pandemic we are asking patients to follow certain guidelines.  Pt aware of COVID protocols and [facility] guidelines

## 2020-06-12 ENCOUNTER — Telehealth: Payer: Self-pay | Admitting: Gastroenterology

## 2020-06-12 NOTE — Telephone Encounter (Signed)
Explained to pt I do not handle insurance- gave him [phone] pre cert

## 2020-06-30 ENCOUNTER — Other Ambulatory Visit: Payer: Self-pay | Admitting: Gastroenterology

## 2020-06-30 ENCOUNTER — Encounter: Payer: Self-pay | Admitting: Gastroenterology

## 2020-06-30 ENCOUNTER — Ambulatory Visit (AMBULATORY_SURGERY_CENTER): Payer: BC Managed Care – PPO | Admitting: Gastroenterology

## 2020-06-30 ENCOUNTER — Other Ambulatory Visit: Payer: Self-pay

## 2020-06-30 VITALS — BP 137/75 | HR 58 | Temp 98.0°F | Resp 10 | Ht 71.0 in | Wt 285.0 lb

## 2020-06-30 DIAGNOSIS — D120 Benign neoplasm of cecum: Secondary | ICD-10-CM

## 2020-06-30 DIAGNOSIS — Z86010 Personal history of colonic polyps: Secondary | ICD-10-CM | POA: Diagnosis not present

## 2020-06-30 DIAGNOSIS — D124 Benign neoplasm of descending colon: Secondary | ICD-10-CM

## 2020-06-30 DIAGNOSIS — D123 Benign neoplasm of transverse colon: Secondary | ICD-10-CM

## 2020-06-30 DIAGNOSIS — D122 Benign neoplasm of ascending colon: Secondary | ICD-10-CM

## 2020-06-30 MED ORDER — SODIUM CHLORIDE 0.9 % IV SOLN: 500.0000 mL | Freq: Once | INTRAVENOUS | Status: DC

## 2020-06-30 NOTE — Progress Notes (Signed)
Called to room to assist during endoscopic procedure.  Patient ID and intended procedure confirmed with present staff. Received instructions for my participation in the procedure from the performing physician.  

## 2020-06-30 NOTE — Op Note (Signed)
[facility] Patient Name: Joshua Burnett Procedure Date: 06/30/2020 1:36 PM MRN: [id number] Endoscopist: Remo Lipps P. Havery [name] , MD Age: 70 Referring MD:  Date of Birth: February 09, 1951 Gender: Male Account #: [id number] Procedure:                Colonoscopy Indications:              High risk colon cancer surveillance: Personal                            history of colonic polyps (12/2016 - 6 polyps) Medicines:                Monitored Anesthesia Care Procedure:                Pre-Anesthesia Assessment:                           - Prior to the procedure, a History and Physical                            was performed, and patient medications and                            allergies were reviewed. The patient's tolerance of                            previous anesthesia was also reviewed. The risks                            and benefits of the procedure and the sedation                            options and risks were discussed with the patient.                            All questions were answered, and informed consent                            was obtained. Prior Anticoagulants: The patient has                            taken no previous anticoagulant or antiplatelet                            agents. ASA Grade Assessment: III - A patient with                            severe systemic disease. After reviewing the risks                            and benefits, the patient was deemed in                            satisfactory condition to undergo the procedure.  After obtaining informed consent, the colonoscope                            was passed under direct vision. Throughout the                            procedure, the patient's blood pressure, pulse, and                            oxygen saturations were monitored continuously. The                            Olympus CF-HQ190 [id number]) Colonoscope was                            introduced  through the anus and advanced to the the                            cecum, identified by appendiceal orifice and                            ileocecal valve. The colonoscopy was performed                            without difficulty. The patient tolerated the                            procedure well. The quality of the bowel                            preparation was good. The ileocecal valve,                            appendiceal orifice, and rectum were photographed. Scope In: 1:44:56 PM Scope Out: 2:05:59 PM Scope Withdrawal Time: 0 hours 18 minutes 45 seconds  Total Procedure Duration: 0 hours 21 minutes 3 seconds  Findings:                 The perianal and digital rectal examinations were                            normal.                           A 3 mm polyp was found in the cecum. The polyp was                            sessile. The polyp was removed with a cold snare.                            Resection and retrieval were complete.                           A 3 mm polyp was found in the ascending colon. The  polyp was sessile. The polyp was removed with a                            cold snare. Resection and retrieval were complete.                           Six sessile polyps were found in the transverse                            colon. The polyps were 3 to 6 mm in size. These                            polyps were removed with a cold snare. Resection                            and retrieval were complete.                           A 3 mm polyp was found in the splenic flexure. The                            polyp was sessile. The polyp was removed with a                            cold snare. Resection and retrieval were complete.                           A 3 mm polyp was found in the descending colon. The                            polyp was sessile. The polyp was removed with a                            cold snare. Resection and retrieval were  complete.                           Multiple small-mouthed diverticula were found in                            the left colon and right colon.                           Anal papilla(e) were hypertrophied.                           Internal hemorrhoids were found during retroflexion.                           The exam was otherwise without abnormality. Complications:            No immediate complications. Estimated blood loss:  Minimal. Estimated Blood Loss:     Estimated blood loss was minimal. Impression:               - One 3 mm polyp in the cecum, removed with a cold                            snare. Resected and retrieved.                           - One 3 mm polyp in the ascending colon, removed                            with a cold snare. Resected and retrieved.                           - Six 3 to 6 mm polyps in the transverse colon,                            removed with a cold snare. Resected and retrieved.                           - One 3 mm polyp at the splenic flexure, removed                            with a cold snare. Resected and retrieved.                           - One 3 mm polyp in the descending colon, removed                            with a cold snare. Resected and retrieved.                           - Diverticulosis in the left colon and in the right                            colon.                           - Anal papilla(e) were hypertrophied.                           - Internal hemorrhoids.                           - The examination was otherwise normal. Recommendation:           - Patient has a contact number available for                            emergencies. The signs and symptoms of potential                            delayed complications were discussed with the  patient. Return to normal activities tomorrow.                            Written discharge instructions were provided to the                             patient.                           - Resume previous diet.                           - Continue present medications.                           - Await pathology results.                           - Anticipate repeat colonoscopy again in 3 years Willaim Rayas. Kolton Kienle, MD 06/30/2020 2:11:19 PM This report has been signed electronically.

## 2020-06-30 NOTE — Patient Instructions (Signed)
Handout given:  Polyps, Hemorrhoids, Diverticulosis Resume previous diet Continue current medications Await pathology results YOU HAD AN ENDOSCOPIC PROCEDURE TODAY AT [facility]:   Refer to the procedure report that was given to you for any specific questions about what was found during the examination.  If the procedure report does not answer your questions, please call your gastroenterologist to clarify.  If you requested that your care partner not be given the details of your procedure findings, then the procedure report has been included in a sealed envelope for you to review at your convenience later.  YOU SHOULD EXPECT: Some feelings of bloating in the abdomen. Passage of more gas than usual.  Walking can help get rid of the air that was put into your GI tract during the procedure and reduce the bloating. If you had a lower endoscopy (such as a colonoscopy or flexible sigmoidoscopy) you may notice spotting of blood in your stool or on the toilet paper. If you underwent a bowel prep for your procedure, you may not have a normal bowel movement for a few days.  Please Note:  You might notice some irritation and congestion in your nose or some drainage.  This is from the oxygen used during your procedure.  There is no need for concern and it should clear up in a day or so.  SYMPTOMS TO REPORT IMMEDIATELY:   Following lower endoscopy (colonoscopy or flexible sigmoidoscopy):  Excessive amounts of blood in the stool  Significant tenderness or worsening of abdominal pains  Swelling of the abdomen that is new, acute  Fever of 100F or higher  For urgent or emergent issues, a gastroenterologist can be reached at any hour by calling [phone]. Do not use MyChart messaging for urgent concerns.    DIET:  We do recommend a small meal at first, but then you may proceed to your regular diet.  Drink plenty of fluids but you should avoid alcoholic beverages for 24  hours.  ACTIVITY:  You should plan to take it easy for the rest of today and you should NOT DRIVE or use heavy machinery until tomorrow (because of the sedation medicines used during the test).    FOLLOW UP: Our staff will call the number listed on your records 48-72 hours following your procedure to check on you and address any questions or concerns that you may have regarding the information given to you following your procedure. If we do not reach you, we will leave a message.  We will attempt to reach you two times.  During this call, we will ask if you have developed any symptoms of COVID 19. If you develop any symptoms (ie: fever, flu-like symptoms, shortness of breath, cough etc.) before then, please call [phone].  If you test positive for Covid 19 in the 2 weeks post procedure, please call and report this information to [place].    If any biopsies were taken you will be contacted by phone or by letter within the next 1-3 weeks.  Please call us at [phone] if you have not heard about the biopsies in 3 weeks.   SIGNATURES/CONFIDENTIALITY: You and/or your care partner have signed paperwork which will be entered into your electronic medical record.  These signatures attest to the fact that that the information above on your After Visit Summary has been reviewed and is understood.  Full responsibility of the confidentiality of this discharge information lies with you and/or your care-partner.

## 2020-06-30 NOTE — Progress Notes (Signed)
PT taken to PACU. Monitors in place. VSS. Report given to RN. 

## 2020-06-30 NOTE — Progress Notes (Signed)
Pt's states no medical or surgical changes since previsit or office visit.  CW - vitals 

## 2020-07-02 ENCOUNTER — Telehealth: Payer: Self-pay

## 2020-07-02 NOTE — Telephone Encounter (Signed)
NO ANSWER, MESSAGE LEFT FOR PATIENT. 

## 2020-07-02 NOTE — Telephone Encounter (Signed)
Second attempt follow up call to pt, lm on vm. 

## 2020-07-10 ENCOUNTER — Other Ambulatory Visit: Payer: Self-pay | Admitting: Internal Medicine

## 2020-07-20 ENCOUNTER — Encounter: Payer: Self-pay | Admitting: Internal Medicine

## 2020-07-20 MED ORDER — TRAMADOL HCL 50 MG PO TABS: ORAL_TABLET | ORAL | 1 refills | Status: DC

## 2020-07-21 ENCOUNTER — Telehealth: Payer: Self-pay

## 2020-07-21 NOTE — Telephone Encounter (Signed)
Waiting for response. Key [id number]

## 2020-07-23 NOTE — Telephone Encounter (Signed)
Tramadol approved 07/21/2020 - 01/21/2021

## 2020-08-18 ENCOUNTER — Other Ambulatory Visit: Payer: Self-pay | Admitting: Internal Medicine

## 2020-08-18 NOTE — Telephone Encounter (Signed)
Please refill as per office routine med refill policy (all routine meds refilled for 3 mo or monthly per pt preference up to one year from last visit, then month to month grace period for 3 mo, then further med refills will have to be denied)  

## 2020-11-09 IMAGING — DX DG CHEST 2V
2 series · 2 of 2 positions shown · non-contrast
Comparison: None.

CLINICAL DATA: Shortness of breath

EXAM:
CHEST - 2 VIEW

[chest pa]
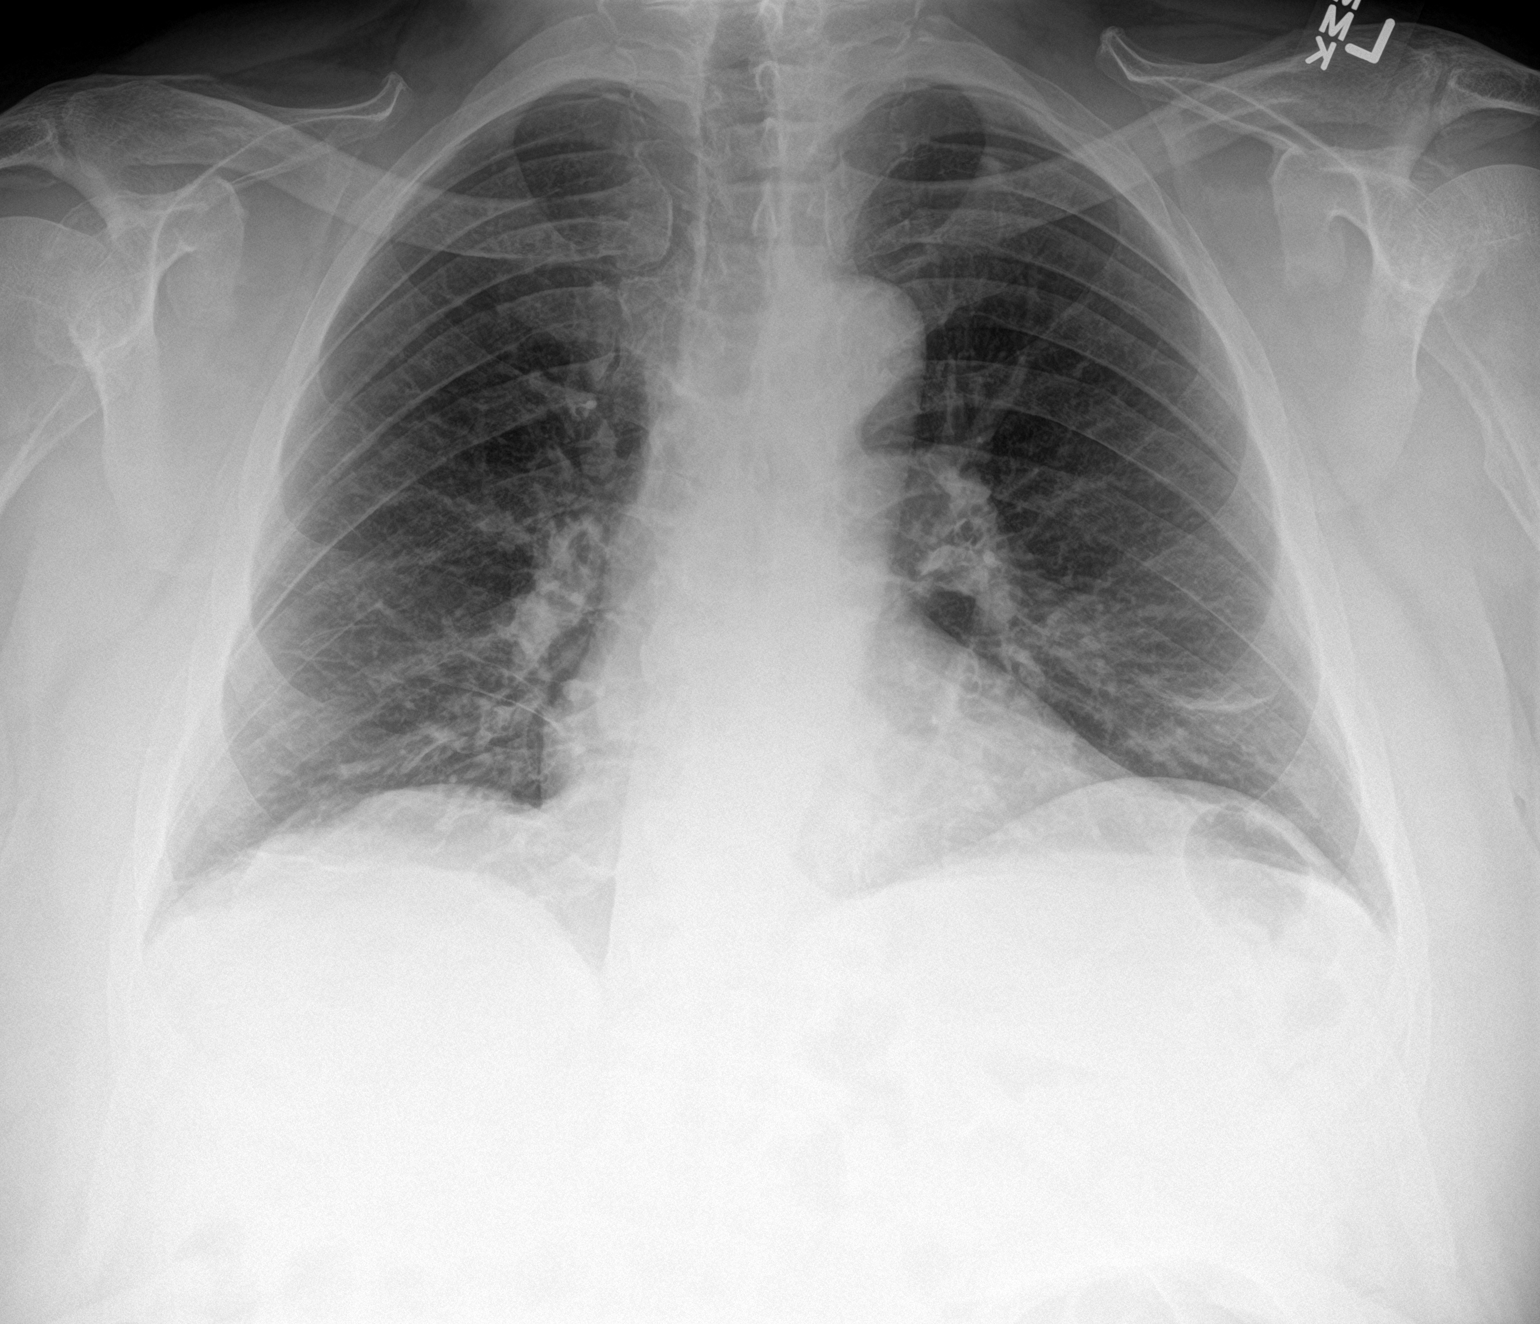

[chest lat]
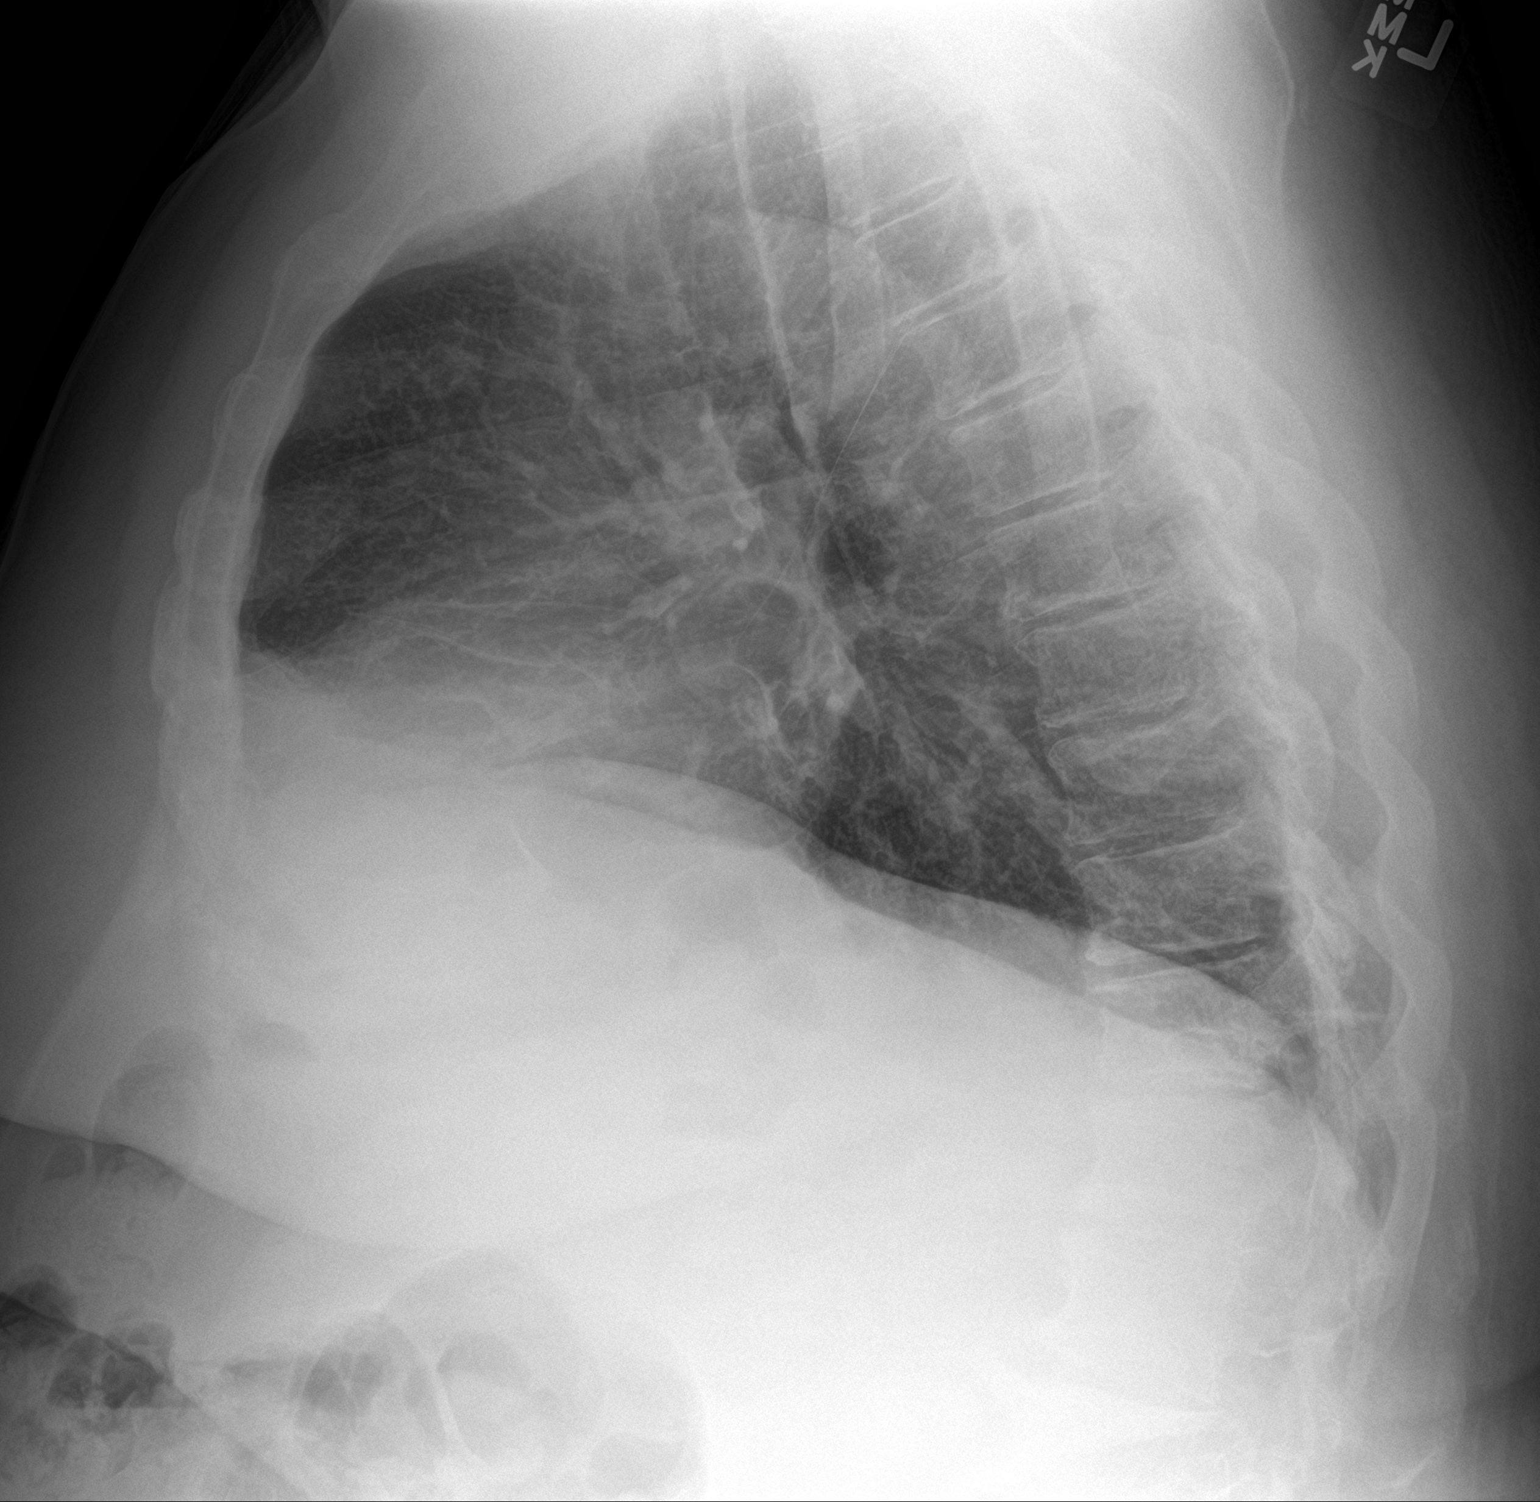

[2 of 2 positions shown; findings below may reference images not displayed]

FINDINGS: There is mild bibasilar atelectasis. The lungs elsewhere are clear.
The heart size and pulmonary vascularity are normal. No adenopathy.
There is degenerative change in the lower thoracic region.
IMPRESSION: Bibasilar atelectasis. Lungs otherwise clear. Cardiac silhouette
within normal limits.

## 2020-12-21 ENCOUNTER — Telehealth: Payer: Self-pay | Admitting: Internal Medicine

## 2020-12-21 ENCOUNTER — Other Ambulatory Visit (INDEPENDENT_AMBULATORY_CARE_PROVIDER_SITE_OTHER): Payer: BC Managed Care – PPO

## 2020-12-21 DIAGNOSIS — R739 Hyperglycemia, unspecified: Secondary | ICD-10-CM

## 2020-12-21 DIAGNOSIS — E559 Vitamin D deficiency, unspecified: Secondary | ICD-10-CM

## 2020-12-21 DIAGNOSIS — E538 Deficiency of other specified B group vitamins: Secondary | ICD-10-CM

## 2020-12-21 DIAGNOSIS — Z0001 Encounter for general adult medical examination with abnormal findings: Secondary | ICD-10-CM

## 2020-12-21 DIAGNOSIS — E785 Hyperlipidemia, unspecified: Secondary | ICD-10-CM

## 2020-12-21 LAB — HEPATIC FUNCTION PANEL
ALT: 10 U/L (ref 0–53)
AST: 16 U/L (ref 0–37)
Albumin: 3.9 g/dL (ref 3.5–5.2)
Alkaline Phosphatase: 69 U/L (ref 39–117)
Bilirubin, Direct: 0.1 mg/dL (ref 0.0–0.3)
Total Bilirubin: 0.8 mg/dL (ref 0.2–1.2)
Total Protein: 6.5 g/dL (ref 6.0–8.3)

## 2020-12-21 LAB — BASIC METABOLIC PANEL
BUN: 12 mg/dL (ref 6–23)
CO2: 29 mEq/L (ref 19–32)
Calcium: 9.3 mg/dL (ref 8.4–10.5)
Chloride: 105 mEq/L (ref 96–112)
Creatinine, Ser: 0.95 mg/dL (ref 0.40–1.50)
GFR: 81.42 mL/min (ref >60.00)
Glucose, Bld: 86 mg/dL (ref 70–99)
Potassium: 4 mEq/L (ref 3.5–5.1)
Sodium: 140 mEq/L (ref 135–145)

## 2020-12-21 LAB — LIPID PANEL
Cholesterol: 131 mg/dL (ref 0–200)
HDL: 52.5 mg/dL (ref >39.00)
LDL Cholesterol: 68 mg/dL (ref 0–99)
NonHDL: 78.94
Total CHOL/HDL Ratio: 3
Triglycerides: 53 mg/dL (ref 0.0–149.0)
VLDL: 10.6 mg/dL (ref 0.0–40.0)

## 2020-12-21 LAB — HEMOGLOBIN A1C: Hgb A1c MFr Bld: 6.2 % (ref 4.6–6.5)

## 2020-12-21 NOTE — Telephone Encounter (Signed)
Patient is requesting order for labs prior to appt on 12-23-2020  Please advise

## 2020-12-21 NOTE — Telephone Encounter (Signed)
Ok labs ordered 

## 2020-12-22 ENCOUNTER — Other Ambulatory Visit: Payer: Self-pay | Admitting: Internal Medicine

## 2020-12-22 NOTE — Telephone Encounter (Signed)
Please refill as per office routine med refill policy (all routine meds to be refilled for 3 mo or monthly (per pt preference) up to one year from last visit, then month to month grace period for 3 mo, then further med refills will have to be denied) ? ?

## 2020-12-23 ENCOUNTER — Ambulatory Visit (INDEPENDENT_AMBULATORY_CARE_PROVIDER_SITE_OTHER): Payer: BC Managed Care – PPO | Admitting: Sports Medicine

## 2020-12-23 ENCOUNTER — Ambulatory Visit (INDEPENDENT_AMBULATORY_CARE_PROVIDER_SITE_OTHER): Payer: BC Managed Care – PPO | Admitting: Internal Medicine

## 2020-12-23 ENCOUNTER — Encounter: Payer: Self-pay | Admitting: Internal Medicine

## 2020-12-23 ENCOUNTER — Ambulatory Visit (INDEPENDENT_AMBULATORY_CARE_PROVIDER_SITE_OTHER): Payer: BC Managed Care – PPO

## 2020-12-23 ENCOUNTER — Other Ambulatory Visit: Payer: Self-pay

## 2020-12-23 VITALS — BP 138/72 | HR 70 | Ht 71.0 in | Wt 274.0 lb

## 2020-12-23 VITALS — BP 138/70 | HR 69 | Ht 71.0 in | Wt 274.0 lb

## 2020-12-23 DIAGNOSIS — E538 Deficiency of other specified B group vitamins: Secondary | ICD-10-CM

## 2020-12-23 DIAGNOSIS — M7551 Bursitis of right shoulder: Secondary | ICD-10-CM | POA: Diagnosis not present

## 2020-12-23 DIAGNOSIS — E785 Hyperlipidemia, unspecified: Secondary | ICD-10-CM | POA: Diagnosis not present

## 2020-12-23 DIAGNOSIS — G4733 Obstructive sleep apnea (adult) (pediatric): Secondary | ICD-10-CM

## 2020-12-23 DIAGNOSIS — M19011 Primary osteoarthritis, right shoulder: Secondary | ICD-10-CM | POA: Diagnosis not present

## 2020-12-23 DIAGNOSIS — I10 Essential (primary) hypertension: Secondary | ICD-10-CM

## 2020-12-23 DIAGNOSIS — R202 Paresthesia of skin: Secondary | ICD-10-CM | POA: Diagnosis not present

## 2020-12-23 DIAGNOSIS — Z0001 Encounter for general adult medical examination with abnormal findings: Secondary | ICD-10-CM | POA: Diagnosis not present

## 2020-12-23 DIAGNOSIS — I5032 Chronic diastolic (congestive) heart failure: Secondary | ICD-10-CM | POA: Diagnosis not present

## 2020-12-23 DIAGNOSIS — M25511 Pain in right shoulder: Secondary | ICD-10-CM

## 2020-12-23 DIAGNOSIS — E559 Vitamin D deficiency, unspecified: Secondary | ICD-10-CM | POA: Diagnosis not present

## 2020-12-23 DIAGNOSIS — R739 Hyperglycemia, unspecified: Secondary | ICD-10-CM

## 2020-12-23 DIAGNOSIS — Z125 Encounter for screening for malignant neoplasm of prostate: Secondary | ICD-10-CM | POA: Diagnosis not present

## 2020-12-23 LAB — BASIC METABOLIC PANEL
BUN: 10 mg/dL (ref 6–23)
CO2: 27 mEq/L (ref 19–32)
Calcium: 9.1 mg/dL (ref 8.4–10.5)
Chloride: 104 mEq/L (ref 96–112)
Creatinine, Ser: 0.91 mg/dL (ref 0.40–1.50)
GFR: 85.73 mL/min (ref >60.00)
Glucose, Bld: 80 mg/dL (ref 70–99)
Potassium: 4.1 mEq/L (ref 3.5–5.1)
Sodium: 140 mEq/L (ref 135–145)

## 2020-12-23 LAB — LIPID PANEL
Cholesterol: 136 mg/dL (ref 0–200)
HDL: 53.5 mg/dL (ref >39.00)
LDL Cholesterol: 65 mg/dL (ref 0–99)
NonHDL: 82.43
Total CHOL/HDL Ratio: 3
Triglycerides: 88 mg/dL (ref 0.0–149.0)
VLDL: 17.6 mg/dL (ref 0.0–40.0)

## 2020-12-23 LAB — CBC WITH DIFFERENTIAL/PLATELET
Basophils Absolute: 0 10*3/uL (ref 0.0–0.1)
Basophils Relative: 0.6 % (ref 0.0–3.0)
Eosinophils Absolute: 0.1 10*3/uL (ref 0.0–0.7)
Eosinophils Relative: 1.7 % (ref 0.0–5.0)
HCT: 39.6 % (ref 39.0–52.0)
Hemoglobin: 12.8 g/dL — ABNORMAL LOW (ref 13.0–17.0)
Lymphocytes Relative: 12.2 % (ref 12.0–46.0)
Lymphs Abs: 0.8 10*3/uL (ref 0.7–4.0)
MCHC: 32.2 g/dL (ref 30.0–36.0)
MCV: 85.4 fl (ref 78.0–100.0)
Monocytes Absolute: 0.6 10*3/uL (ref 0.1–1.0)
Monocytes Relative: 9.5 % (ref 3.0–12.0)
Neutro Abs: 5.2 10*3/uL (ref 1.4–7.7)
Neutrophils Relative %: 76 % (ref 43.0–77.0)
Platelets: 194 10*3/uL (ref 150.0–400.0)
RBC: 4.64 Mil/uL (ref 4.22–5.81)
RDW: 14.7 % (ref 11.5–15.5)
WBC: 6.8 10*3/uL (ref 4.0–10.5)

## 2020-12-23 LAB — HEPATIC FUNCTION PANEL
ALT: 11 U/L (ref 0–53)
AST: 16 U/L (ref 0–37)
Albumin: 4.1 g/dL (ref 3.5–5.2)
Alkaline Phosphatase: 80 U/L (ref 39–117)
Bilirubin, Direct: 0.2 mg/dL (ref 0.0–0.3)
Total Bilirubin: 0.8 mg/dL (ref 0.2–1.2)
Total Protein: 6.5 g/dL (ref 6.0–8.3)

## 2020-12-23 LAB — VITAMIN D 25 HYDROXY (VIT D DEFICIENCY, FRACTURES): VITD: 37.71 ng/mL (ref 30.00–100.00)

## 2020-12-23 LAB — VITAMIN B12: Vitamin B-12: 191 pg/mL — ABNORMAL LOW (ref 211–911)

## 2020-12-23 LAB — HEMOGLOBIN A1C: Hgb A1c MFr Bld: 6.1 % (ref 4.6–6.5)

## 2020-12-23 LAB — BRAIN NATRIURETIC PEPTIDE: Pro B Natriuretic peptide (BNP): 21 pg/mL (ref 0.0–100.0)

## 2020-12-23 LAB — TSH: TSH: 0.98 u[IU]/mL (ref 0.35–5.50)

## 2020-12-23 LAB — PSA: PSA: 1.36 ng/mL (ref 0.10–4.00)

## 2020-12-23 MED ORDER — MELOXICAM 15 MG PO TABS: 15.0000 mg | ORAL_TABLET | Freq: Every day | ORAL | 0 refills | Status: DC

## 2020-12-23 NOTE — Progress Notes (Signed)
Joshua Burnett D.[place] Phone: [phone]   Assessment and Plan:     1. Acute pain of right shoulder 2. Primary osteoarthritis of right shoulder -Chronic with exacerbation, initial sports medicine visit - Likely acute flare of chronic osteoarthritis of right shoulder based on HPI, physical exam, x-ray - Start meloxicam 15 mg daily x3 weeks - Start HEP for rotator cuff to prevent adhesive capsulitis - Offered subacromial CSI.  Patient declines at today's visit, but will follow-up in 2 weeks if no improvement or worsening of symptoms for CSI at that time -X-ray obtained at today's clinic.  My interpretation: No acute fracture, joint space maintained, no dislocation.  Cortical irregularities over humeral head and glenoid consistent with osteoarthritis.  Mild cortical irregularities at [facility] joint  Pertinent previous records reviewed include none   Follow Up: follow-up in 2 weeks if no improvement or worsening of symptoms for CSI at that time     Subjective:   I, Joshua Burnett, am serving as a scribe for Dr. Glennon Mac  Chief Complaint: Right shoulder pain   HPI:   12/23/20 Patient is a 70 year old male presenting with Right shoulder pain going on for a year or more but in the last month or two the pain is more often. Pain used to just be when he laid on his right side, but now it is more than just laying down. Patient locates pain to the lateral shoulder sometimes will radiate to elbow.   Mechanical symptoms:yes but that is his norm Numbness/tingling: yes  Weakness: no  Aggravates: laying on right side,  Treatments tried: tylenol, tramadol, Ice, heat soak in the tub   Relevant Historical Information: Hypertension, CHF diastolic, OSA  Additional pertinent review of systems negative.   Current Outpatient Medications:    acetaminophen (TYLENOL) 500 MG tablet, Take 500 mg by mouth every 6 (six)  hours as needed., Disp: , Rfl:    amLODipine (NORVASC) 5 MG tablet, TAKE 1 TABLET BY MOUTH EVERY DAY, Disp: 90 tablet, Rfl: 1   atorvastatin (LIPITOR) 40 MG tablet, TAKE 1 TABLET BY MOUTH EVERY DAY, Disp: 90 tablet, Rfl: 3   Cholecalciferol (VITAMIN D3 PO), Take by mouth., Disp: , Rfl:    COVID-19 mRNA vaccine, Pfizer, (PFIZER-BIONTECH COVID-19 VACC) 30 MCG/0.3ML SUSP, , Disp: , Rfl:    desloratadine (CLARINEX) 5 MG tablet, TAKE 1 TABLET BY MOUTH EVERY DAY, Disp: 90 tablet, Rfl: 3   Diclofenac Sodium (PENNSAID) 2 % SOLN, Place 2 application onto the skin 2 (two) times daily., Disp: 112 g, Rfl: 3   furosemide (LASIX) 20 MG tablet, 1 tab by mouth in the AM, and 1 in the PM as needed for persistent swelling, Disp: 180 tablet, Rfl: 3   irbesartan (AVAPRO) 150 MG tablet, TAKE 1 TABLET BY MOUTH EVERY DAY, Disp: 90 tablet, Rfl: 1   meloxicam (MOBIC) 15 MG tablet, Take 1 tablet (15 mg total) by mouth daily., Disp: 21 tablet, Rfl: 0   Omega-3 Krill Oil 500 MG CAPS, Take 1 capsule by mouth daily., Disp: , Rfl:    Probiotic Product (PROBIOTIC DAILY PO), Take by mouth. Phillips colon health, Disp: , Rfl:    Sodium Sulfate-Mag Sulfate-KCl (SUTAB) [phone] MG TABS, Take 24 tablets by mouth as directed. 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 BIN: [id number] PCN: CN GROUP: [id number] MEMBER ID: [id number];PJK AS SECONDARY INSURANCE ;NO PRIOR AUTHORIZATION, Disp: 24 tablet, Rfl: 0   tadalafil (CIALIS) 20 MG tablet, TAKE  1 TABLET BY MOUTH EVERY DAY AS NEEDED, Disp: 6 tablet, Rfl: 9   tamsulosin (FLOMAX) 0.4 MG CAPS capsule, Take 0.8 mg by mouth daily., Disp: , Rfl:    tiZANidine (ZANAFLEX) 2 MG tablet, TAKE 1 TABLET BY MOUTH EVERYDAY AT BEDTIME, Disp: 90 tablet, Rfl: 1   traMADol (ULTRAM) 50 MG tablet, 1 tab by mouth four times daily as needed, Disp: 120 tablet, Rfl: 1   Turmeric 500 MG CAPS, Take 1,400 mg by mouth 2 (two) times daily., Disp: , Rfl:    Objective:     Vitals:   12/23/20 1344  BP: 138/72  Pulse: 70  SpO2: 95%   Weight: 274 lb (124.3 kg)  Height: 5\' 11"  (1.803 m)      Body mass index is 38.22 kg/m.    Physical Exam:    Gen: Appears well, nad, nontoxic and pleasant Neuro:sensation intact, strength is 5/5 with df/pf/inv/ev, muscle tone wnl Skin: no suspicious lesion or defmority Psych: A&O, appropriate mood and affect  Right shoulder: no deformity, swelling or muscle wasting No scapular winging FF 180, abd 180, int 0, ext 90 NTTP over the [place], clavicle, ac, coracoid, biceps groove, humerus, deltoid, trapezius, cervical spine Mildly positive neer, hawkings, empty can, subscap liftoff, obriens, crossarm Negative speeds Neg ant drawer, sulcus sign, apprehension Negative Spurling's test bilat FROM of neck    Electronically signed by:  Joshua Burnett D.[facility] 2:21 PM 12/23/20

## 2020-12-23 NOTE — Patient Instructions (Addendum)
Good to see you  Meloxicam 15mg  for 3 weeks  Home exercises given  See me again 2 weeks if not better for steroid injection before trip

## 2020-12-23 NOTE — Progress Notes (Signed)
Patient ID: Joshua Ishihara Sr., male   DOB: 1950/08/03, 69 y.o.   MRN: [id number]         Chief Complaint:: wellness exam and Office Visit (Right shoulder pain and pain in ankles)  And left arm intermittent numbness, bilateral leg swelling, hld       HPI:  Joshua Ishihara Sr. is a 70 y.o. male here for wellness exam; declines tdap, o/w up to date                        Also c/o 2 wk osnet right shoulder pain and tender, mild to mod intermittent, sharp and dull, worse to lie on right side and lift arm.  Also has intermittent left arm numbness only involving the left elbow area mostly but can radaite towards the shoulder and the wrists, without other neck pain or arm weakness,.  Pt denies chest pain, increased sob or doe, wheezing, orthopnea, PND, palpitations, dizziness or syncope, but has ongoing bilat leg swelling maybe somewhat worse, better overall in the am, worse again in the pm, pt concerned about chf.  Trying to follow lower chol diet.   Pt denies fever, wt loss, night sweats, loss of appetite, or other constitutional symptoms     Wt Readings from Last 3 Encounters:  12/23/20 274 lb (124.3 kg)  12/23/20 274 lb (124.3 kg)  06/30/20 285 lb (129.3 kg)   BP Readings from Last 3 Encounters:  12/23/20 138/72  12/23/20 138/70  06/30/20 137/75   Immunization History  Administered Date(s) Administered   Fluad Quad(high Dose 65+) 11/15/2018, 12/30/2019   Influenza Split 12/21/2010, 12/22/2011   Influenza Whole 01/02/2009, 12/18/2009   Influenza,inj,Quad PF,6+ Mos 12/28/2012, 01/09/2015, 01/26/2016   Influenza-Unspecified 11/12/2016, 12/13/2017, 12/12/2020   PFIZER Comirnaty(Gray Top)Covid-19 Tri-Sucrose Vaccine 06/13/2020   PFIZER(Purple Top)SARS-COV-2 Vaccination 04/12/2019, 05/07/2019, 12/05/2019   Pfizer Covid-19 Vaccine Bivalent Booster 64yrs & up 12/22/2020   Pneumococcal Conjugate-13 08/30/2016   Pneumococcal Polysaccharide-23 10/04/2017   Tdap 12/21/2010   Zoster  Recombinat (Shingrix) 04/25/2017, 09/24/2017, 01/11/2019   Zoster, Live 12/28/2012   There are no preventive care reminders to display for this patient.     Past Medical History:  Diagnosis Date   Acute pharyngitis 01/26/2009   ALLERGIC RHINITIS 08/07/2007   Allergy    ARTHRITIS 08/07/2007   BACK PAIN 09/15/2009   BENIGN PROSTATIC HYPERTROPHY 05/20/2008   Cataract    removed bilaterally    EATING DISORDER, HX OF 08/07/2007   ERECTILE DYSFUNCTION 02/18/2008   GERD (gastroesophageal reflux disease)    HYPERLIPIDEMIA 08/07/2007   HYPERTENSION 08/07/2007   NECK PAIN, RIGHT 09/15/2009   Neuromuscular disorder ([place])    carpal tunnel    OSTEOARTHRITIS, KNEES, BILATERAL 09/15/2009   SINUSITIS- ACUTE-NOS 04/30/2010   Sleep apnea    weas cpap    Past Surgical History:  Procedure Laterality Date   COLONOSCOPY     GASTRIC BYPASS  2006   Left foot surgery  2008   Lower back  2004   POLYPECTOMY     Right knee arthroscopy  1994   UPPER GASTROINTESTINAL ENDOSCOPY      reports that he has never smoked. He has never used smokeless tobacco. He reports current alcohol use. He reports that he does not use drugs. family history includes Heart disease in his father; Hyperlipidemia in his father; Hypertension in his father. Allergies  Allergen Reactions   Ace Inhibitors     REACTION: tongue swelling   Current Outpatient  Medications on File Prior to Visit  Medication Sig Dispense Refill   acetaminophen (TYLENOL) 500 MG tablet Take 500 mg by mouth every 6 (six) hours as needed.     amLODipine (NORVASC) 5 MG tablet TAKE 1 TABLET BY MOUTH EVERY DAY 90 tablet 1   atorvastatin (LIPITOR) 40 MG tablet TAKE 1 TABLET BY MOUTH EVERY DAY 90 tablet 3   Cholecalciferol (VITAMIN D3 PO) Take by mouth.     COVID-19 mRNA vaccine, Pfizer, (PFIZER-BIONTECH COVID-19 VACC) 30 MCG/0.3ML SUSP      desloratadine (CLARINEX) 5 MG tablet TAKE 1 TABLET BY MOUTH EVERY DAY 90 tablet 3   Diclofenac Sodium (PENNSAID) 2 % SOLN Place  2 application onto the skin 2 (two) times daily. 112 g 3   furosemide (LASIX) 20 MG tablet 1 tab by mouth in the AM, and 1 in the PM as needed for persistent swelling 180 tablet 3   Omega-3 Krill Oil 500 MG CAPS Take 1 capsule by mouth daily.     Probiotic Product (PROBIOTIC DAILY PO) Take by mouth. Phillips colon health     Sodium Sulfate-Mag Sulfate-KCl (SUTAB) [phone] MG TABS Take 24 tablets by mouth as directed. 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 BIN: [id number] PCN: CN GROUP: [id number] MEMBER ID: [id number];DJS AS SECONDARY INSURANCE ;NO PRIOR AUTHORIZATION 24 tablet 0   tadalafil (CIALIS) 20 MG tablet TAKE 1 TABLET BY MOUTH EVERY DAY AS NEEDED 6 tablet 9   tamsulosin (FLOMAX) 0.4 MG CAPS capsule Take 0.8 mg by mouth daily.     tiZANidine (ZANAFLEX) 2 MG tablet TAKE 1 TABLET BY MOUTH EVERYDAY AT BEDTIME 90 tablet 1   traMADol (ULTRAM) 50 MG tablet 1 tab by mouth four times daily as needed 120 tablet 1   Turmeric 500 MG CAPS Take 1,400 mg by mouth 2 (two) times daily.     irbesartan (AVAPRO) 150 MG tablet TAKE 1 TABLET BY MOUTH EVERY DAY 90 tablet 1   No current facility-administered medications on file prior to visit.        ROS:  All others reviewed and negative.  Objective        PE:  BP 138/70 (BP Location: Right Arm, Patient Position: Sitting, Cuff Size: Large)   Pulse 69   Ht 5\' 11"  (1.803 m)   Wt 274 lb (124.3 kg)   SpO2 95%   BMI 38.22 kg/m                 Constitutional: Pt appears in NAD               HENT: Head: NCAT.                Right Ear: External ear normal.                 Left Ear: External ear normal.                Eyes: . Pupils are equal, round, and reactive to light. Conjunctivae and EOM are normal               Nose: without d/c or deformity               Neck: Neck supple. Gross normal ROM               Cardiovascular: Normal rate and regular rhythm.                 Pulmonary/Chest: Effort normal and breath sounds without rales or wheezing.  Abd:   Soft, NT, ND, + BS, no organomegaly               Neurological: Pt is alert. At baseline orientation, motor grossly intact               Skin: Skin is warm. No rashes, no other new lesions, LE edema  - trace bilateral               Right arm with tender subacromail bursa                LUE o/w neurovasc intact               Psychiatric: Pt behavior is normal without agitation   Micro: none  Cardiac tracings I have personally interpreted today:  none  Pertinent Radiological findings (summarize): none   Lab Results  Component Value Date   WBC 6.8 12/23/2020   HGB 12.8 (L) 12/23/2020   HCT 39.6 12/23/2020   PLT 194.0 12/23/2020   GLUCOSE 80 12/23/2020   CHOL 136 12/23/2020   TRIG 88.0 12/23/2020   HDL 53.50 12/23/2020   LDLDIRECT 157.6 01/02/2009   LDLCALC 65 12/23/2020   ALT 11 12/23/2020   AST 16 12/23/2020   NA 140 12/23/2020   K 4.1 12/23/2020   CL 104 12/23/2020   CREATININE 0.91 12/23/2020   BUN 10 12/23/2020   CO2 27 12/23/2020   TSH 0.98 12/23/2020   PSA 1.36 12/23/2020   HGBA1C 6.1 12/23/2020   Assessment/Plan:  Joshua Ishihara Sr. is a 70 y.o. Black or African American [2] male with  has a past medical history of Acute pharyngitis (01/26/2009), ALLERGIC RHINITIS (08/07/2007), Allergy, ARTHRITIS (08/07/2007), BACK PAIN (09/15/2009), BENIGN PROSTATIC HYPERTROPHY (05/20/2008), Cataract, EATING DISORDER, HX OF (08/07/2007), ERECTILE DYSFUNCTION (02/18/2008), GERD (gastroesophageal reflux disease), HYPERLIPIDEMIA (08/07/2007), HYPERTENSION (08/07/2007), NECK PAIN, RIGHT (09/15/2009), Neuromuscular disorder ([place]), OSTEOARTHRITIS, KNEES, BILATERAL (09/15/2009), SINUSITIS- ACUTE-NOS (04/30/2010), and Sleep apnea.  Chronic diastolic (congestive) heart failure (HCC) Suspect element of venous insufficiency, for bnp with labs, cont same tx  HLD (hyperlipidemia) Lab Results  Component Value Date   LDLCALC 65 12/23/2020   Stable, pt to continue current statin lipitor 40,also for card ct  score testing   Essential hypertension BP Readings from Last 3 Encounters:  12/23/20 138/72  12/23/20 138/70  06/30/20 137/75   Stable, pt to continue medical treatment norvasc, avapro   Hyperglycemia Lab Results  Component Value Date   HGBA1C 6.1 12/23/2020   Stable, pt to continue current medical treatment  - diet   Bursitis of right shoulder Mild to mod, for volt gel topical prn, also refer sport medicine  Encounter for well adult exam with abnormal findings Age and sex appropriate education and counseling updated with regular exercise and diet Referrals for preventative services - none needed Immunizations addressed - decilens tdap Smoking counseling  - none needed Evidence for depression or other mood disorder - none significant Most recent labs reviewed. I have personally reviewed and have noted: 1) the patient's medical and social history 2) The patient's current medications and supplements 3) The patient's height, weight, and BMI have been recorded in the chart   Paresthesia of left arm Exam bening, suspect mild ulnar neuritis, continue to follow  Followup: Return in about 6 months (around 06/23/2021).  Cathlean Cower, MD 12/27/2020 7:11 PM [facility]

## 2020-12-23 NOTE — Patient Instructions (Addendum)
We have discussed the Cardiac CT Score test to measure the calcification level (if any) in your heart arteries.  This test has been ordered in our [place], so please call [facility] directly, as they prefer this, at [phone] to be scheduled.  Please continue all other medications as before, and refills have been done if requested.  Please have the pharmacy call with any other refills you may need.  Please continue your efforts at being more active, low cholesterol diet, and weight control.  You are otherwise up to date with prevention measures today.  Please keep your appointments with your specialists as you may have planned  You will be contacted regarding the referral for: Sports medicine  Please go to the LAB at the blood drawing area for the tests to be done  You will be contacted by phone if any changes need to be made immediately.  Otherwise, you will receive a letter about your results with an explanation, but please check with MyChart first.  Please remember to sign up for MyChart if you have not done so, as this will be important to you in the future with finding out test results, communicating by private email, and scheduling acute appointments online when needed.  Please make an Appointment to return in 6 months, or sooner if needed

## 2020-12-24 ENCOUNTER — Encounter: Payer: Self-pay | Admitting: Internal Medicine

## 2020-12-24 LAB — URINALYSIS, ROUTINE W REFLEX MICROSCOPIC
Bilirubin Urine: NEGATIVE
Hgb urine dipstick: NEGATIVE
Ketones, ur: NEGATIVE
Leukocytes,Ua: NEGATIVE
Nitrite: NEGATIVE
RBC / HPF: NONE SEEN (ref 0–?)
Specific Gravity, Urine: <=1.005 — AB (ref 1.000–1.030)
Total Protein, Urine: NEGATIVE
Urine Glucose: NEGATIVE
Urobilinogen, UA: 0.2 (ref 0.0–1.0)
WBC, UA: NONE SEEN (ref 0–?)
pH: 6 (ref 5.0–8.0)

## 2020-12-27 ENCOUNTER — Encounter: Payer: Self-pay | Admitting: Internal Medicine

## 2020-12-27 DIAGNOSIS — R202 Paresthesia of skin: Secondary | ICD-10-CM | POA: Insufficient documentation

## 2020-12-27 DIAGNOSIS — M7551 Bursitis of right shoulder: Secondary | ICD-10-CM | POA: Insufficient documentation

## 2020-12-27 NOTE — Assessment & Plan Note (Signed)
Lab Results  Component Value Date   HGBA1C 6.1 12/23/2020   Stable, pt to continue current medical treatment  - diet

## 2020-12-27 NOTE — Assessment & Plan Note (Signed)
Age and sex appropriate education and counseling updated with regular exercise and diet Referrals for preventative services - none needed Immunizations addressed - decilens tdap Smoking counseling  - none needed Evidence for depression or other mood disorder - none significant Most recent labs reviewed. I have personally reviewed and have noted: 1) the patient's medical and social history 2) The patient's current medications and supplements 3) The patient's height, weight, and BMI have been recorded in the chart

## 2020-12-27 NOTE — Assessment & Plan Note (Signed)
Mild to mod, for volt gel topical prn, also refer sport medicine

## 2020-12-27 NOTE — Assessment & Plan Note (Signed)
Suspect element of venous insufficiency, for bnp with labs, cont same tx

## 2020-12-27 NOTE — Assessment & Plan Note (Addendum)
Lab Results  Component Value Date   LDLCALC 65 12/23/2020   Stable, pt to continue current statin lipitor 40,also for card ct score testing

## 2020-12-27 NOTE — Assessment & Plan Note (Signed)
Exam bening, suspect mild ulnar neuritis, continue to follow

## 2020-12-27 NOTE — Assessment & Plan Note (Signed)
BP Readings from Last 3 Encounters:  12/23/20 138/72  12/23/20 138/70  06/30/20 137/75   Stable, pt to continue medical treatment norvasc, avapro

## 2021-01-11 NOTE — Progress Notes (Signed)
Joshua Burnett D.[place] [facility] Phone: [phone]   Assessment and Plan:    1. Primary osteoarthritis of right shoulder 2. Acute pain of right shoulder -Chronic with exacerbation, subsequent visit - Significant relief of right shoulder pain after completion of 2 weeks of meloxicam and HEP - Stop daily use of meloxicam and transition to meloxicam 15 mg as needed, 1-2 times weekly, for breakthrough pain - Start Tylenol 500 mg 2 tablets 2 times a day for maintenance pain relief - Continue HEP for shoulder   Pertinent previous records reviewed include none pertinent   Follow Up: As needed if no improvement or worsening of symptoms.  Could consider formal PT versus repeat NSAID course versus CSI   Subjective:   I, Joshua Burnett, am serving as a scribe for Dr. Glennon Mac  Chief Complaint: right shoulder pain follow up   HPI:     12/23/20 Patient is a 70 year old male presenting with Right shoulder pain going on for a year or more but in the last month or two the pain is more often. Pain used to just be when he laid on his right side, but now it is more than just laying down. Patient locates pain to the lateral shoulder sometimes will radiate to elbow.    Mechanical symptoms:yes but that is his norm Numbness/tingling: yes  Weakness: no  Aggravates: laying on right side,  Treatments tried: tylenol, tramadol, Ice, heat soak in the tub  01/12/21 Patient states that he is doing great. Shoulder has been better, but today did have some tenderness. Patient states that the meloxicam worked so well that he was able to enjoy his [birth year] in [place] with lots of walking.   Relevant Historical Information: Hypertension, CHF diastolic, OSA  Additional pertinent review of systems negative.   Current Outpatient Medications:    acetaminophen (TYLENOL) 500 MG tablet, Take 500 mg by mouth every 6 (six) hours as needed.,  Disp: , Rfl:    amLODipine (NORVASC) 5 MG tablet, TAKE 1 TABLET BY MOUTH EVERY DAY, Disp: 90 tablet, Rfl: 1   atorvastatin (LIPITOR) 40 MG tablet, TAKE 1 TABLET BY MOUTH EVERY DAY, Disp: 90 tablet, Rfl: 3   Cholecalciferol (VITAMIN D3 PO), Take by mouth., Disp: , Rfl:    COVID-19 mRNA vaccine, Pfizer, (PFIZER-BIONTECH COVID-19 VACC) 30 MCG/0.3ML SUSP, , Disp: , Rfl:    desloratadine (CLARINEX) 5 MG tablet, TAKE 1 TABLET BY MOUTH EVERY DAY, Disp: 90 tablet, Rfl: 3   Diclofenac Sodium (PENNSAID) 2 % SOLN, Place 2 application onto the skin 2 (two) times daily., Disp: 112 g, Rfl: 3   furosemide (LASIX) 20 MG tablet, 1 tab by mouth in the AM, and 1 in the PM as needed for persistent swelling, Disp: 180 tablet, Rfl: 3   irbesartan (AVAPRO) 150 MG tablet, TAKE 1 TABLET BY MOUTH EVERY DAY, Disp: 90 tablet, Rfl: 1   Omega-3 Krill Oil 500 MG CAPS, Take 1 capsule by mouth daily., Disp: , Rfl:    Probiotic Product (PROBIOTIC DAILY PO), Take by mouth. Phillips colon health, Disp: , Rfl:    Sodium Sulfate-Mag Sulfate-KCl (SUTAB) [phone] MG TABS, Take 24 tablets by mouth as directed. 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 BIN: [id number] PCN: CN GROUP: [id number] MEMBER ID: [id number];TKW AS SECONDARY INSURANCE ;NO PRIOR AUTHORIZATION, Disp: 24 tablet, Rfl: 0   tadalafil (CIALIS) 20 MG tablet, TAKE 1 TABLET BY MOUTH EVERY DAY AS NEEDED, Disp: 6 tablet, Rfl: 9  tamsulosin (FLOMAX) 0.4 MG CAPS capsule, Take 0.8 mg by mouth daily., Disp: , Rfl:    tiZANidine (ZANAFLEX) 2 MG tablet, TAKE 1 TABLET BY MOUTH EVERYDAY AT BEDTIME, Disp: 90 tablet, Rfl: 1   traMADol (ULTRAM) 50 MG tablet, 1 tab by mouth four times daily as needed, Disp: 120 tablet, Rfl: 1   Turmeric 500 MG CAPS, Take 1,400 mg by mouth 2 (two) times daily., Disp: , Rfl:    meloxicam (MOBIC) 15 MG tablet, Take 1 tablet (15 mg total) by mouth daily., Disp: 30 tablet, Rfl: 0   Objective:     Vitals:   01/12/21 0958  BP: 130/70  Pulse: 81  SpO2: 95%  Weight: 276 lb  (125.2 kg)  Height: 5\' 11"  (1.803 m)      Body mass index is 38.49 kg/m.    Physical Exam:    Gen: Appears well, nad, nontoxic and pleasant Neuro:sensation intact, strength is 5/5 with df/pf/inv/ev, muscle tone wnl Skin: no suspicious lesion or defmority Psych: A&O, appropriate mood and affect  Right shoulder: no deformity, swelling or muscle wasting No scapular winging FF 180, abd 170, int 10, ext 70 NTTP over the [place], clavicle, ac, coracoid, biceps groove, humerus, deltoid, trapezius, cervical spine Neg neer, hawkings, empty can, subscap liftoff, speeds, obriens, crossarm Neg ant drawer, sulcus sign, apprehension Negative Spurling's test bilat FROM of neck    Electronically signed by:  Joshua Burnett D.[facility] 10:20 AM 01/12/21

## 2021-01-12 ENCOUNTER — Ambulatory Visit: Payer: BC Managed Care – PPO | Admitting: Sports Medicine

## 2021-01-12 ENCOUNTER — Other Ambulatory Visit: Payer: Self-pay

## 2021-01-12 VITALS — BP 130/70 | HR 81 | Ht 71.0 in | Wt 276.0 lb

## 2021-01-12 DIAGNOSIS — M19011 Primary osteoarthritis, right shoulder: Secondary | ICD-10-CM | POA: Diagnosis not present

## 2021-01-12 DIAGNOSIS — M25511 Pain in right shoulder: Secondary | ICD-10-CM | POA: Diagnosis not present

## 2021-01-12 MED ORDER — MELOXICAM 15 MG PO TABS: 15.0000 mg | ORAL_TABLET | Freq: Every day | ORAL | 0 refills | Status: DC

## 2021-01-12 NOTE — Patient Instructions (Addendum)
Good to see you  Refill of meloxicam to take as needed  Recommend using Tylenol arthritis 2 tabs 2 times a day for relief maintenance Continue home exercises Follow up as needed

## 2021-01-13 ENCOUNTER — Other Ambulatory Visit: Payer: Self-pay | Admitting: Internal Medicine

## 2021-01-13 NOTE — Telephone Encounter (Signed)
Please refill as per office routine med refill policy (all routine meds to be refilled for 3 mo or monthly (per pt preference) up to one year from last visit, then month to month grace period for 3 mo, then further med refills will have to be denied) ? ?

## 2021-01-27 ENCOUNTER — Other Ambulatory Visit: Payer: Self-pay | Admitting: Internal Medicine

## 2021-02-12 ENCOUNTER — Other Ambulatory Visit: Payer: Self-pay | Admitting: Sports Medicine

## 2021-03-01 ENCOUNTER — Other Ambulatory Visit: Payer: Self-pay | Admitting: Sports Medicine

## 2021-05-12 ENCOUNTER — Telehealth: Payer: Self-pay

## 2021-05-12 MED ORDER — TRAMADOL HCL 50 MG PO TABS: ORAL_TABLET | ORAL | 5 refills | Status: DC

## 2021-05-12 NOTE — Telephone Encounter (Signed)
Pt is requesting a refill for: ?traMADol (ULTRAM) 50 MG tablet ? ?Pharmacy: ?[id number] - [place], [place] - [address] ? ?LOV 12/23/20 ?ROV 06/23/21 ?

## 2021-05-14 ENCOUNTER — Encounter: Payer: Self-pay | Admitting: Internal Medicine

## 2021-05-17 NOTE — Telephone Encounter (Signed)
Patient calling in ? ?Patient wanted to know if we had received PA from [facility] for rx Tramadol ? ?Please confirm & fu w/ patient [phone] ?

## 2021-06-23 ENCOUNTER — Encounter: Payer: Self-pay | Admitting: Internal Medicine

## 2021-06-23 ENCOUNTER — Ambulatory Visit (INDEPENDENT_AMBULATORY_CARE_PROVIDER_SITE_OTHER): Payer: Medicare Other | Admitting: Internal Medicine

## 2021-06-23 VITALS — BP 138/72 | HR 72 | Temp 97.7°F | Ht 71.0 in | Wt 280.0 lb

## 2021-06-23 DIAGNOSIS — J309 Allergic rhinitis, unspecified: Secondary | ICD-10-CM | POA: Diagnosis not present

## 2021-06-23 DIAGNOSIS — R739 Hyperglycemia, unspecified: Secondary | ICD-10-CM | POA: Diagnosis not present

## 2021-06-23 DIAGNOSIS — E538 Deficiency of other specified B group vitamins: Secondary | ICD-10-CM | POA: Diagnosis not present

## 2021-06-23 DIAGNOSIS — I872 Venous insufficiency (chronic) (peripheral): Secondary | ICD-10-CM | POA: Diagnosis not present

## 2021-06-23 DIAGNOSIS — Z0001 Encounter for general adult medical examination with abnormal findings: Secondary | ICD-10-CM

## 2021-06-23 DIAGNOSIS — I10 Essential (primary) hypertension: Secondary | ICD-10-CM

## 2021-06-23 DIAGNOSIS — E559 Vitamin D deficiency, unspecified: Secondary | ICD-10-CM | POA: Diagnosis not present

## 2021-06-23 DIAGNOSIS — E785 Hyperlipidemia, unspecified: Secondary | ICD-10-CM | POA: Diagnosis not present

## 2021-06-23 LAB — URINALYSIS, ROUTINE W REFLEX MICROSCOPIC
Bilirubin Urine: NEGATIVE
Hgb urine dipstick: NEGATIVE
Ketones, ur: NEGATIVE
Leukocytes,Ua: NEGATIVE
Nitrite: NEGATIVE
RBC / HPF: NONE SEEN (ref 0–?)
Specific Gravity, Urine: 1.015 (ref 1.000–1.030)
Total Protein, Urine: NEGATIVE
Urine Glucose: NEGATIVE
Urobilinogen, UA: 0.2 (ref 0.0–1.0)
pH: 6 (ref 5.0–8.0)

## 2021-06-23 LAB — CBC WITH DIFFERENTIAL/PLATELET
Basophils Absolute: 0 10*3/uL (ref 0.0–0.1)
Basophils Relative: 0.5 % (ref 0.0–3.0)
Eosinophils Absolute: 0.1 10*3/uL (ref 0.0–0.7)
Eosinophils Relative: 2.3 % (ref 0.0–5.0)
HCT: 40.5 % (ref 39.0–52.0)
Hemoglobin: 13.2 g/dL (ref 13.0–17.0)
Lymphocytes Relative: 18.4 % (ref 12.0–46.0)
Lymphs Abs: 0.9 10*3/uL (ref 0.7–4.0)
MCHC: 32.5 g/dL (ref 30.0–36.0)
MCV: 84.2 fl (ref 78.0–100.0)
Monocytes Absolute: 0.5 10*3/uL (ref 0.1–1.0)
Monocytes Relative: 9.8 % (ref 3.0–12.0)
Neutro Abs: 3.5 10*3/uL (ref 1.4–7.7)
Neutrophils Relative %: 69 % (ref 43.0–77.0)
Platelets: 179 10*3/uL (ref 150.0–400.0)
RBC: 4.81 Mil/uL (ref 4.22–5.81)
RDW: 15 % (ref 11.5–15.5)
WBC: 5.1 10*3/uL (ref 4.0–10.5)

## 2021-06-23 LAB — BASIC METABOLIC PANEL
BUN: 12 mg/dL (ref 6–23)
CO2: 31 mEq/L (ref 19–32)
Calcium: 9.2 mg/dL (ref 8.4–10.5)
Chloride: 104 mEq/L (ref 96–112)
Creatinine, Ser: 0.89 mg/dL (ref 0.40–1.50)
GFR: 86.86 mL/min (ref >60.00)
Glucose, Bld: 86 mg/dL (ref 70–99)
Potassium: 4.3 mEq/L (ref 3.5–5.1)
Sodium: 141 mEq/L (ref 135–145)

## 2021-06-23 LAB — MICROALBUMIN / CREATININE URINE RATIO
Creatinine,U: 102.2 mg/dL
Microalb Creat Ratio: 0.7 mg/g (ref 0.0–30.0)
Microalb, Ur: <0.7 mg/dL (ref 0.0–1.9)

## 2021-06-23 LAB — LIPID PANEL
Cholesterol: 148 mg/dL (ref 0–200)
HDL: 57.2 mg/dL (ref >39.00)
LDL Cholesterol: 73 mg/dL (ref 0–99)
NonHDL: 90.56
Total CHOL/HDL Ratio: 3
Triglycerides: 89 mg/dL (ref 0.0–149.0)
VLDL: 17.8 mg/dL (ref 0.0–40.0)

## 2021-06-23 LAB — HEMOGLOBIN A1C: Hgb A1c MFr Bld: 6.3 % (ref 4.6–6.5)

## 2021-06-23 LAB — HEPATIC FUNCTION PANEL
ALT: 13 U/L (ref 0–53)
AST: 15 U/L (ref 0–37)
Albumin: 4.2 g/dL (ref 3.5–5.2)
Alkaline Phosphatase: 72 U/L (ref 39–117)
Bilirubin, Direct: 0.2 mg/dL (ref 0.0–0.3)
Total Bilirubin: 0.9 mg/dL (ref 0.2–1.2)
Total Protein: 6.6 g/dL (ref 6.0–8.3)

## 2021-06-23 LAB — TSH: TSH: 1.2 u[IU]/mL (ref 0.35–5.50)

## 2021-06-23 LAB — VITAMIN D 25 HYDROXY (VIT D DEFICIENCY, FRACTURES): VITD: 46.55 ng/mL (ref 30.00–100.00)

## 2021-06-23 LAB — VITAMIN B12: Vitamin B-12: >1504 pg/mL — ABNORMAL HIGH (ref 211–911)

## 2021-06-23 LAB — PSA: PSA: 1.44 ng/mL (ref 0.10–4.00)

## 2021-06-23 MED ORDER — WEGOVY 0.5 MG/0.5ML ~~LOC~~ SOAJ: 0.5000 mg | SUBCUTANEOUS | 3 refills | Status: DC

## 2021-06-23 NOTE — Assessment & Plan Note (Signed)
Lab Results  ?Component Value Date  ? VITAMINB12 191 (L) 12/23/2020  ? ?Low, to start oral replacement - b12 1000 mcg qd ? ?

## 2021-06-23 NOTE — Patient Instructions (Addendum)
Please take all new medication as prescribed - the wegovy (sent to [facility] ? ?Please continue all other medications as before, including the B12 for now ? ?Please have the pharmacy call with any other refills you may need. ? ?Please continue your efforts at being more active, low cholesterol diet, and weight control. ? ?You are otherwise up to date with prevention measures today. ? ?Please keep your appointments with your specialists as you may have planned ? ?Please go to the LAB at the blood drawing area for the tests to be done ? ?You will be contacted by phone if any changes need to be made immediately.  Otherwise, you will receive a letter about your results with an explanation, but please check with MyChart first. ? ?Please remember to sign up for MyChart if you have not done so, as this will be important to you in the future with finding out test results, communicating by private email, and scheduling acute appointments online when needed. ? ?Please make an Appointment to return in 6 months, or sooner if needed, also with Lab Appointment for testing done 3-5 days before at the [facility] (so this is for TWO appointments - please see the scheduling desk as you leave) ? ?Due to the ongoing Covid 19 pandemic, our lab now requires an appointment for any labs done at our office.  If you need labs done and do not have an appointment, please call our office ahead of time to schedule before presenting to the lab for your testing. ? ? ? ? ? ?

## 2021-06-23 NOTE — Progress Notes (Signed)
Patient ID: Joshua Ishihara Sr., male   DOB: October 26, 1950, 71 y.o.   MRN: [id number] ? ? ? ?     Chief Complaint:: wellness exam and low b12, hyperglycemia, obesity, allergies ? ?     HPI:  Joshua Delillo Sr. is a 71 y.o. male here for wellness exam; already up to date. ?         ?              Also due for surgury at some point for droopy eyelids bilateral; s/p bilateral cataract.  Pans to be more active now that he is retired since 2020 to help lose wt.  Has hx of gastric bypass    Not taking B12, Pt denies chest pain, increased sob or doe, wheezing, orthopnea, PND, increased LE swelling, palpitations, dizziness or syncope., though has some ongoing venous insufficiency no change   Pt denies polydipsia, polyuria, or new focal neuro s/s.    Pt denies fever, wt loss, night sweats, loss of appetite, or other constitutional symptoms  Does have several wks ongoing nasal allergy symptoms with clearish congestion, itch and sneezing, without fever, pain, ST, cough, swelling or wheezing. ?  ?Wt Readings from Last 3 Encounters:  ?06/23/21 280 lb (127 kg)  ?01/12/21 276 lb (125.2 kg)  ?12/23/20 274 lb (124.3 kg)  ? ?BP Readings from Last 3 Encounters:  ?06/23/21 138/72  ?01/12/21 130/70  ?12/23/20 138/72  ? ?Immunization History  ?Administered Date(s) Administered  ? Fluad Quad(high Dose 65+) 11/15/2018, 12/30/2019  ? Influenza Split 12/21/2010, 12/22/2011  ? Influenza Whole 01/02/2009, 12/18/2009  ? Influenza,inj,Quad PF,6+ Mos 12/28/2012, 01/09/2015, 01/26/2016  ? Influenza-Unspecified 11/12/2016, 12/13/2017, 12/12/2020  ? PFIZER Comirnaty(Gray Top)Covid-19 Tri-Sucrose Vaccine 06/13/2020  ? PFIZER(Purple Top)SARS-COV-2 Vaccination 04/12/2019, 05/07/2019, 12/05/2019  ? Pension scheme manager 84yr & up 12/22/2020  ? Pneumococcal Conjugate-13 08/30/2016  ? Pneumococcal Polysaccharide-23 10/04/2017  ? Tdap 12/21/2010  ? Zoster Recombinat (Shingrix) 04/25/2017, 09/24/2017, 01/11/2019  ? Zoster, Live  12/28/2012  ?There are no preventive care reminders to display for this patient. ?  ? ?Past Medical History:  ?Diagnosis Date  ? Acute pharyngitis 01/26/2009  ? ALLERGIC RHINITIS 08/07/2007  ? Allergy   ? ARTHRITIS 08/07/2007  ? BACK PAIN 09/15/2009  ? BENIGN PROSTATIC HYPERTROPHY 05/20/2008  ? Cataract   ? removed bilaterally   ? EATING DISORDER, HX OF 08/07/2007  ? ERECTILE DYSFUNCTION 02/18/2008  ? GERD (gastroesophageal reflux disease)   ? HYPERLIPIDEMIA 08/07/2007  ? HYPERTENSION 08/07/2007  ? NECK PAIN, RIGHT 09/15/2009  ? Neuromuscular disorder ([place]   ? carpal tunnel   ? OSTEOARTHRITIS, KNEES, BILATERAL 09/15/2009  ? SINUSITIS- ACUTE-NOS 04/30/2010  ? Sleep apnea   ? weas cpap   ? ?Past Surgical History:  ?Procedure Laterality Date  ? COLONOSCOPY    ? GASTRIC BYPASS  2006  ? Left foot surgery  2008  ? Lower back  2004  ? POLYPECTOMY    ? Right knee arthroscopy  1994  ? UPPER GASTROINTESTINAL ENDOSCOPY    ? ? reports that he has never smoked. He has never used smokeless tobacco. He reports current alcohol use. He reports that he does not use drugs. ?family history includes Heart disease in his father; Hyperlipidemia in his father; Hypertension in his father. ?Allergies  ?Allergen Reactions  ? Ace Inhibitors   ?  REACTION: tongue swelling  ? ?Current Outpatient Medications on File Prior to Visit  ?Medication Sig Dispense Refill  ? acetaminophen (TYLENOL) 500 MG tablet  Take 500 mg by mouth every 6 (six) hours as needed.    ? amLODipine (NORVASC) 5 MG tablet TAKE 1 TABLET BY MOUTH EVERY DAY 90 tablet 3  ? atorvastatin (LIPITOR) 40 MG tablet TAKE 1 TABLET BY MOUTH EVERY DAY 90 tablet 3  ? Cholecalciferol (VITAMIN D3 PO) Take by mouth.    ? desloratadine (CLARINEX) 5 MG tablet TAKE 1 TABLET BY MOUTH EVERY DAY 90 tablet 3  ? Diclofenac Sodium (PENNSAID) 2 % SOLN Place 2 application onto the skin 2 (two) times daily. 112 g 3  ? furosemide (LASIX) 20 MG tablet 1 tab by mouth in the AM, and 1 in the PM as needed for persistent  swelling 180 tablet 3  ? irbesartan (AVAPRO) 150 MG tablet TAKE 1 TABLET BY MOUTH EVERY DAY 90 tablet 1  ? meloxicam (MOBIC) 15 MG tablet Take 1 tablet (15 mg total) by mouth daily. 30 tablet 0  ? Omega-3 Krill Oil 500 MG CAPS Take 1 capsule by mouth daily.    ? Probiotic Product (PROBIOTIC DAILY PO) Take by mouth. Hardin Negus colon health    ? Sodium Sulfate-Mag Sulfate-KCl (SUTAB) [phone] MG TABS Take 24 tablets by mouth as directed. 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 BIN: [id number] PCN: CN GROUP: [id number] MEMBER ID: [id number];VQM AS SECONDARY INSURANCE ;NO PRIOR AUTHORIZATION 24 tablet 0  ? tadalafil (CIALIS) 20 MG tablet TAKE 1 TABLET BY MOUTH EVERY DAY AS NEEDED 6 tablet 9  ? tamsulosin (FLOMAX) 0.4 MG CAPS capsule Take 0.8 mg by mouth daily.    ? tiZANidine (ZANAFLEX) 2 MG tablet TAKE 1 TABLET BY MOUTH EVERYDAY AT BEDTIME 90 tablet 1  ? traMADol (ULTRAM) 50 MG tablet TAKE 1 TABLET BY MOUTH EVERY 6 HOURS AS NEEDED FOR MODERATE PAIN 120 tablet 5  ? Turmeric 500 MG CAPS Take 1,400 mg by mouth 2 (two) times daily.    ? ?No current facility-administered medications on file prior to visit.  ? ?     ROS:  All others reviewed and negative. ? ?Objective  ? ?     PE:  BP 138/72 (BP Location: Left Arm, Patient Position: Sitting, Cuff Size: Large)   Pulse 72   Temp 97.7 ?F (36.5 ?C) (Oral)   Ht '5\' 11"'$  (1.803 m)   Wt 280 lb (127 kg)   SpO2 92%   BMI 39.05 kg/m?  ? ?              Constitutional: Pt appears in NAD ?              HENT: Head: NCAT.  ?              Right Ear: External ear normal.   ?              Left Ear: External ear normal.  ?              Eyes: . Pupils are equal, round, and reactive to light. Conjunctivae and EOM are normal ?              Nose: without d/c or deformity ?              Neck: Neck supple. Gross normal ROM ?              Cardiovascular: Normal rate and regular rhythm.   ?              Pulmonary/Chest: Effort normal and breath sounds without rales or wheezing.  ?  Abd:  Soft, NT, ND, +  BS, no organomegaly ?              Neurological: Pt is alert. At baseline orientation, motor grossly intact ?              Skin: Skin is warm. No rashes, no other new lesions, LE edema - trace bilateral ?              Psychiatric: Pt behavior is normal without agitation  ? ?Micro: none ? ?Cardiac tracings I have personally interpreted today:  none ? ?Pertinent Radiological findings (summarize): none  ? ?Lab Results  ?Component Value Date  ? WBC 5.1 06/23/2021  ? HGB 13.2 06/23/2021  ? HCT 40.5 06/23/2021  ? PLT 179.0 06/23/2021  ? GLUCOSE 86 06/23/2021  ? CHOL 148 06/23/2021  ? TRIG 89.0 06/23/2021  ? HDL 57.20 06/23/2021  ? LDLDIRECT 157.6 01/02/2009  ? [place] 73 06/23/2021  ? ALT 13 06/23/2021  ? AST 15 06/23/2021  ? NA 141 06/23/2021  ? K 4.3 06/23/2021  ? CL 104 06/23/2021  ? CREATININE 0.89 06/23/2021  ? BUN 12 06/23/2021  ? CO2 31 06/23/2021  ? TSH 1.20 06/23/2021  ? PSA 1.44 06/23/2021  ? HGBA1C 6.3 06/23/2021  ? MICROALBUR <0.7 06/23/2021  ? ?Assessment/Plan:  ?Joshua Janifer Adie Sr. is a 71 y.o. Black or African American [2] male with  has a past medical history of Acute pharyngitis (01/26/2009), ALLERGIC RHINITIS (08/07/2007), Allergy, ARTHRITIS (08/07/2007), BACK PAIN (09/15/2009), BENIGN PROSTATIC HYPERTROPHY (05/20/2008), Cataract, EATING DISORDER, HX OF (08/07/2007), ERECTILE DYSFUNCTION (02/18/2008), GERD (gastroesophageal reflux disease), HYPERLIPIDEMIA (08/07/2007), HYPERTENSION (08/07/2007), NECK PAIN, RIGHT (09/15/2009), Neuromuscular disorder ([place]), OSTEOARTHRITIS, KNEES, BILATERAL (09/15/2009), SINUSITIS- ACUTE-NOS (04/30/2010), and Sleep apnea. ? ?B12 deficiency ?Lab Results  ?Component Value Date  ? VITAMINB12 191 (L) 12/23/2020  ? ?Low, to start oral replacement - b12 1000 mcg qd ? ? ?Venous insufficiency ?D/w pt - for leg elevation, low salt diet, wt loss / control, compression stockings ? ?Encounter for well adult exam with abnormal findings ?Age and sex appropriate education and counseling updated with  regular exercise and diet ?Referrals for preventative services - none needed ?Immunizations addressed - none needed ?Smoking counseling  - none needed ?Evidence for depression or other mood disorder - none significant

## 2021-06-27 NOTE — Assessment & Plan Note (Signed)
BP Readings from Last 3 Encounters:  ?06/23/21 138/72  ?01/12/21 130/70  ?12/23/20 138/72  ? ?Stable, pt to continue medical treatment amlodipine, avapro ?

## 2021-06-27 NOTE — Assessment & Plan Note (Signed)
Lab Results  ?Component Value Date  ? HGBA1C 6.3 06/23/2021  ? ?Stable, pt to continue current medical treatment  - diet ? ?

## 2021-06-27 NOTE — Assessment & Plan Note (Signed)
Pt unable for meaningful wt loss - for wegovy asd,  to f/u any worsening symptoms or concerns ?

## 2021-06-27 NOTE — Assessment & Plan Note (Signed)
Mild seasonal flare, pt encouraged to restart otc nasacort asd ?

## 2021-06-27 NOTE — Addendum Note (Signed)
Addended by: Biagio Borg on: 06/27/2021 02:15 PM ? ? Modules accepted: Orders ? ?

## 2021-06-27 NOTE — Assessment & Plan Note (Signed)

## 2021-06-27 NOTE — Assessment & Plan Note (Signed)
Lab Results  ?Component Value Date  ? [place] 73 06/23/2021  ? ?Stable, pt to continue current statin lipitor 40 ? ?

## 2021-06-27 NOTE — Assessment & Plan Note (Signed)
D/w pt - for leg elevation, low salt diet, wt loss / control, compression stockings ?

## 2021-07-02 ENCOUNTER — Telehealth: Payer: Self-pay | Admitting: Internal Medicine

## 2021-07-02 MED ORDER — ATORVASTATIN CALCIUM 40 MG PO TABS: 40.0000 mg | ORAL_TABLET | Freq: Every day | ORAL | 3 refills | Status: DC

## 2021-07-02 NOTE — Telephone Encounter (Signed)
1.Medication Requested: ?atorvastatin (LIPITOR) 40 MG tablet ?2. Pharmacy (Name, Street, [place]): ?[facility] [phone] - [place], [place] - [address] Phone:  [phone]  ?Fax:  [phone]  ?  ? ?3. On Med List: yes  ? ?4. Last Visit with PCP: ? ?5. Next visit date with PCP: ? ?Pt states that he is completely out of the medication. Please send in ASAP. ? ?Agent: Please be advised that RX refills may take up to 3 business days. We ask that you follow-up with your pharmacy.  ?

## 2021-07-02 NOTE — Telephone Encounter (Signed)
Medication sent to pharmacy  

## 2021-07-04 ENCOUNTER — Encounter: Payer: Self-pay | Admitting: Internal Medicine

## 2021-07-05 NOTE — Telephone Encounter (Signed)
Prescription was sent to pharmacy 07/02/41. Confirmed through computer and verbally with pharmacist that prescription was received and is ready. Patient has been notified.  ?

## 2021-07-05 NOTE — Telephone Encounter (Signed)
Pt called in and states that medication was not sent in.  ? ?States that he called pharmacy today and it was still not received.  ? ?Pt requesting callback if there are any issues on getting refill.  ?

## 2021-07-06 MED ORDER — IRBESARTAN 150 MG PO TABS
150.0000 mg | ORAL_TABLET | Freq: Every day | ORAL | 3 refills | Status: DC
Start: 2021-07-06 — End: 2022-06-29

## 2021-08-06 ENCOUNTER — Telehealth: Payer: Self-pay

## 2021-08-06 ENCOUNTER — Telehealth: Payer: Self-pay | Admitting: Internal Medicine

## 2021-08-06 MED ORDER — TRAMADOL HCL 50 MG PO TABS: ORAL_TABLET | ORAL | 5 refills | Status: DC

## 2021-08-06 NOTE — Addendum Note (Signed)
Addended by: Biagio Borg on: 08/06/2021 03:59 PM   Modules accepted: Orders

## 2021-08-06 NOTE — Telephone Encounter (Signed)
Pt has called again to ask about his Tramodal RX. Says he is completely out and doesn't want to go into the weekend w/o his medication. [facility]

## 2021-08-06 NOTE — Telephone Encounter (Signed)
Pt is needing a rx refill for his traMADol (ULTRAM) 50 MG tablet. Pt is completely out of his medication and has been calling to get this med refilled for the past month. The pts pharmacy states they have sent ina refill request multiple times and has asked that the pt reaches out to the office.

## 2021-08-06 NOTE — Telephone Encounter (Signed)
Ok done erx 

## 2021-08-06 NOTE — Telephone Encounter (Signed)
Not sure why the issue when pt should have multiple refills    This was done in mar 2023  traMADol (ULTRAM) 50 MG tablet [[id number]]    Order Details Dose, Route, Frequency: As Directed  Dispense Quantity: 120 tablet Refills: 5   Note to Pharmacy: This request is for a new prescription for a controlled substance as required by Federal/State law.       Sig: TAKE 1 TABLET BY MOUTH EVERY 6 HOURS AS NEEDED FOR MODERATE PAIN       Start Date: 05/12/21 End Date: --  Written Date: 05/12/21 Expiration Date: 11/08/21  Original Order:  traMADol (ULTRAM) 50 MG tablet [[id number]]  Providers  Authorizing Provider:   Biagio Borg, MD  [address] [place], [place] [address]  Phone:  [phone]   Fax:  [phone]  DEA #:  [id number]   NPI:  [id number]      Ordering User:  Biagio Borg, MD       Pharmacy  [facility] [place], [place]  [address] [place] [address]  Phone:  [phone]  Fax:  [phone]  DEA #:  [id number]  DAW Reason: --

## 2021-08-06 NOTE — Telephone Encounter (Signed)
Prescription was sent to wrong pharmacy in March. Per patient, rx should be sent to [facility] in [place]. Pharmacy is in patient's chart.

## 2021-10-11 ENCOUNTER — Encounter: Payer: Self-pay | Admitting: Internal Medicine

## 2021-10-12 NOTE — Telephone Encounter (Signed)
Sorry, we dont tx osa so we are not responsible for the supplies Please continue to reach out to pulmonary , thanks

## 2021-10-15 NOTE — Telephone Encounter (Signed)
error 

## 2021-10-25 ENCOUNTER — Ambulatory Visit (INDEPENDENT_AMBULATORY_CARE_PROVIDER_SITE_OTHER): Payer: Medicare Other | Admitting: Nurse Practitioner

## 2021-10-25 ENCOUNTER — Encounter: Payer: Self-pay | Admitting: Nurse Practitioner

## 2021-10-25 VITALS — BP 134/60 | HR 78 | Temp 98.3°F | Ht 71.0 in | Wt 281.2 lb

## 2021-10-25 DIAGNOSIS — G4762 Sleep related leg cramps: Secondary | ICD-10-CM | POA: Insufficient documentation

## 2021-10-25 DIAGNOSIS — Z9989 Dependence on other enabling machines and devices: Secondary | ICD-10-CM | POA: Diagnosis not present

## 2021-10-25 DIAGNOSIS — G4733 Obstructive sleep apnea (adult) (pediatric): Secondary | ICD-10-CM | POA: Diagnosis not present

## 2021-10-25 NOTE — Patient Instructions (Addendum)
Continue to use CPAP every night, minimum of 4-6 hours a night.  Change equipment every 30 days or as directed by DME. Wash your tubing with warm soap and water daily, hang to dry. Wash humidifier portion weekly.  Be aware of reduced alertness and do not drive or operate heavy machinery if experiencing this or drowsiness.  Exercise encouraged, as tolerated. Avoid or decrease alcohol consumption and medications that make you more sleepy, if possible. Notify if persistent daytime sleepiness occurs even with consistent use of CPAP.  Labs today   Follow up in 6 months with Dr. Ander Slade. If symptoms do not improve or worsen, please contact office for sooner follow up or seek emergency care.

## 2021-10-25 NOTE — Assessment & Plan Note (Signed)
Healthy weight loss encouraged 

## 2021-10-25 NOTE — Assessment & Plan Note (Signed)
Moderate OSA with AHI 19.9 on previous HST. Excellent compliance with CPAP and continues to receive good benefit. Residual AHI 0.8. No significant leaks. Needs rx renewed for supplies - sent to his DME today. Encouraged to continue use.  Patient Instructions  Continue to use CPAP every night, minimum of 4-6 hours a night.  Change equipment every 30 days or as directed by DME. Wash your tubing with warm soap and water daily, hang to dry. Wash humidifier portion weekly.  Be aware of reduced alertness and do not drive or operate heavy machinery if experiencing this or drowsiness.  Exercise encouraged, as tolerated. Avoid or decrease alcohol consumption and medications that make you more sleepy, if possible. Notify if persistent daytime sleepiness occurs even with consistent use of CPAP.  Labs today   Follow up in 6 months with Dr. Ander Slade. If symptoms do not improve or worsen, please contact office for sooner follow up or seek emergency care.

## 2021-10-25 NOTE — Assessment & Plan Note (Signed)
Nocturnal leg cramps. He has a history of these and tends to wax and wane. Recently seems a bit more consistent. Does not appear to be consistent with RLS and no daytime symptoms. He did drink pickle juice before bed last night and did not experience any cramping. On lasix. Check basic labs today - BMET, mag, CBC

## 2021-10-25 NOTE — Progress Notes (Signed)
$'@Patient'B$  ID: Joshua Ishihara Sr., Joshua Burnett    DOB: Oct 22, 1950, 71 y.o.   MRN: [id number]  No chief complaint on file.   Referring provider: Biagio Borg, MD  HPI: 71 year old Joshua Burnett, never smoker followed for OSA on CPAP. He is a patient of Dr. Judson Roch and last seen in office 01/06/2020. Past medical history significant for HTN, venous insufficiency, allergic rhinitis, OA, BPH, HLD, ED, obesity. Previously underwent gastric bypass surgery.   TEST/EVENTS:  10/28/2019 HST: 19.9/h, SpO2 low 78%  01/06/2020: OV with Dr. Ander Slade.  Had a history of obstructive sleep apnea.  He then had bypass surgery with significant weight loss.  He has regained some of this weight and started having dryness in his mouth in the mornings, daytime fatigue.  Repeat home sleep study ordered at previous visit showed moderate obstructive sleep apnea.  He was restarted on CPAP 5 to 15 cm water.  Seems to be tolerating this well.  No significant leaks.  Fatigue has improved.  Follow-up 6 months.  10/25/2021: Today-follow-up Patient presents today for overdue follow-up.  He has no concerns or complaints.  Just needs some new CPAP supplies.  He wears his CPAP nightly for the entire night.  Feels like he rested much better on it and wakes in the morning feeling well rested.  Rarely will fall asleep without it on and when he does he notices a big difference the next day.  Fatigue symptoms have improved since being on therapy.  Denies any drowsy driving or morning headaches.  Wears a fullface mask. Goes to bed around 1130/11:45 PM.  Falls asleep quickly.  Wakes around 5:30 AM.  He usually does not wake at night; however, recently he has been having some leg cramps that wake him up.  He dealt with this years ago and it will occasionally happen off and on, but seems to be a little more consistent over the past month.  He drank pickle juice last night which seemed to have worked as he did not wake up at all with any cramps.  No daytime  symptoms or calf pain/swelling.  No numbness or tingling.  Does not feel the urge to constantly move his leg and reports that as painful cramping that will awaken him from his sleep but goes away quickly.  He is on Lasix.   He was previously retired.  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 Weight has been stable.   09/22/2021-10/21/2021 CPAP Auto 5-15 cmH2O Download 30/30 days; 90% >4 hr; av use 5 hr 47 min Pressure median 7.3, 95th 11.6 Leaks median 2.8, 95th 8.5 AHI 0.8  Allergies  Allergen Reactions   Ace Inhibitors     REACTION: tongue swelling    Immunization History  Administered Date(s) Administered   Fluad Quad(high Dose 65+) 11/15/2018, 12/30/2019   Influenza Split 12/21/2010, 12/22/2011   Influenza Whole 01/02/2009, 12/18/2009   Influenza,inj,Quad PF,6+ Mos 12/28/2012, 01/09/2015, 01/26/2016   Influenza-Unspecified 11/12/2016, 12/13/2017, 12/12/2020   PFIZER Comirnaty(Gray Top)Covid-19 Tri-Sucrose Vaccine 06/13/2020   PFIZER(Purple Top)SARS-COV-2 Vaccination 04/12/2019, 05/07/2019, 12/05/2019   Pfizer Covid-19 Vaccine Bivalent Booster 44yr & up 12/22/2020   Pneumococcal Conjugate-13 08/30/2016   Pneumococcal Polysaccharide-23 10/04/2017   Tdap 12/21/2010   Zoster Recombinat (Shingrix) 04/25/2017, 09/24/2017, 01/11/2019   Zoster, Live 12/28/2012    Past Medical History:  Diagnosis Date   Acute pharyngitis 01/26/2009   ALLERGIC RHINITIS 08/07/2007   Allergy  ARTHRITIS 08/07/2007   BACK PAIN 09/15/2009   BENIGN PROSTATIC HYPERTROPHY 05/20/2008   Cataract    removed bilaterally    EATING DISORDER, HX OF 08/07/2007   ERECTILE DYSFUNCTION 02/18/2008   GERD (gastroesophageal reflux disease)    HYPERLIPIDEMIA 08/07/2007   HYPERTENSION 08/07/2007   NECK PAIN, RIGHT 09/15/2009   Neuromuscular disorder ([place])    carpal tunnel    OSTEOARTHRITIS, KNEES, BILATERAL 09/15/2009   SINUSITIS- ACUTE-NOS 04/30/2010   Sleep  apnea    weas cpap     Tobacco History: Social History   Tobacco Use  Smoking Status Never  Smokeless Tobacco Never   Counseling given: Not Answered   Outpatient Medications Prior to Visit  Medication Sig Dispense Refill   acetaminophen (TYLENOL) 500 MG tablet Take 500 mg by mouth every 6 (six) hours as needed.     amLODipine (NORVASC) 5 MG tablet TAKE 1 TABLET BY MOUTH EVERY DAY 90 tablet 3   atorvastatin (LIPITOR) 40 MG tablet Take 1 tablet (40 mg total) by mouth daily. 90 tablet 3   Cholecalciferol (VITAMIN D3 PO) Take by mouth.     Diclofenac Sodium (PENNSAID) 2 % SOLN Place 2 application onto the skin 2 (two) times daily. 112 g 3   furosemide (LASIX) 20 MG tablet 1 tab by mouth in the AM, and 1 in the PM as needed for persistent swelling 180 tablet 3   irbesartan (AVAPRO) 150 MG tablet Take 1 tablet (150 mg total) by mouth daily. 90 tablet 3   Omega-3 Krill Oil 500 MG CAPS Take 1 capsule by mouth daily.     Probiotic Product (PROBIOTIC DAILY PO) Take by mouth. [employer] colon health     Sodium Sulfate-Mag Sulfate-KCl (SUTAB) [phone] MG TABS Take 24 tablets by mouth as directed. 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 BIN: [id number] PCN: CN GROUP: [id number] MEMBER ID: [id number];EUM AS SECONDARY INSURANCE ;NO PRIOR AUTHORIZATION 24 tablet 0   tamsulosin (FLOMAX) 0.4 MG CAPS capsule Take 0.8 mg by mouth daily.     tiZANidine (ZANAFLEX) 2 MG tablet TAKE 1 TABLET BY MOUTH EVERYDAY AT BEDTIME 90 tablet 1   traMADol (ULTRAM) 50 MG tablet TAKE 1 TABLET BY MOUTH EVERY 6 HOURS AS NEEDED FOR MODERATE PAIN 120 tablet 5   Turmeric 500 MG CAPS Take 1,400 mg by mouth 2 (two) times daily.     desloratadine (CLARINEX) 5 MG tablet TAKE 1 TABLET BY MOUTH EVERY DAY (Patient not taking: Reported on 10/25/2021) 90 tablet 3   meloxicam (MOBIC) 15 MG tablet Take 1 tablet (15 mg total) by mouth daily. (Patient not taking: Reported on 10/25/2021) 30 tablet 0   Semaglutide-Weight Management (WEGOVY) 0.5 MG/0.5ML SOAJ Inject  0.5 mg into the skin once a week. (Patient not taking: Reported on 10/25/2021) 6 mL 3   tadalafil (CIALIS) 20 MG tablet TAKE 1 TABLET BY MOUTH EVERY DAY AS NEEDED (Patient not taking: Reported on 10/25/2021) 6 tablet 9   No facility-administered medications prior to visit.     Review of Systems:   Constitutional: No weight loss or gain, night sweats, fevers, chills, fatigue, or lassitude. HEENT: No headaches, difficulty swallowing, tooth/dental problems, or sore throat. No sneezing, itching, ear ache, nasal congestion, or post nasal drip CV:  No chest pain, orthopnea, PND, swelling in lower extremities, anasarca, dizziness, palpitations, syncope Resp: No shortness of breath with exertion or at rest. No excess mucus or change in color of mucus. No productive or non-productive. No hemoptysis. No wheezing.  No chest wall deformity GU: No  dysuria, change in color of urine, urgency or frequency.  No flank pain, no hematuria  MSK:  +nocturnal leg cramps. No joint pain or swelling.  No decreased range of motion.  No back pain. Neuro: No dizziness or lightheadedness.  Psych: No depression or anxiety. Mood stable.     Physical Exam:  BP 134/60 (BP Location: Right Arm, Patient Position: Sitting, Cuff Size: Large)   Pulse 78   Temp 98.3 F (36.8 C) (Oral)   Ht '5\' 11"'$  (1.803 m)   Wt 281 lb 3.2 oz (127.6 kg)   SpO2 98%   BMI 39.22 kg/m   GEN: Pleasant, interactive, well-appearing; obese; in no acute distress. HEENT:  Normocephalic and atraumatic. PERRLA. Sclera white. Nasal turbinates pink, moist and patent bilaterally. No rhinorrhea present. Oropharynx pink and moist, without exudate or edema. No lesions, ulcerations, or postnasal drip.  NECK:  Supple w/ fair ROM. No JVD present. Normal carotid impulses w/o bruits. Thyroid symmetrical with no goiter or nodules palpated. No lymphadenopathy.   CV: RRR, no m/r/g, no peripheral edema. Pulses intact, +2 bilaterally. No cyanosis, pallor or  clubbing. PULMONARY:  Unlabored, regular breathing. Clear bilaterally A&P w/o wheezes/rales/rhonchi. No accessory muscle use. No dullness to percussion. GI: BS present and normoactive. Soft, non-tender to palpation. No organomegaly or masses detected. No CVA tenderness. MSK: No erythema, warmth or tenderness. Cap refil <2 sec all extrem. No deformities or joint swelling noted.  Neuro: A/Ox3. No focal deficits noted.   Skin: Warm, no lesions or rashe Psych: Normal affect and behavior. Judgement and thought content appropriate.     Lab Results:  CBC    Component Value Date/Time   WBC 5.1 06/23/2021 0937   RBC 4.81 06/23/2021 0937   HGB 13.2 06/23/2021 0937   HCT 40.5 06/23/2021 0937   PLT 179.0 06/23/2021 0937   MCV 84.2 06/23/2021 0937   MCHC 32.5 06/23/2021 0937   RDW 15.0 06/23/2021 0937   LYMPHSABS 0.9 06/23/2021 0937   MONOABS 0.5 06/23/2021 0937   EOSABS 0.1 06/23/2021 0937   BASOSABS 0.0 06/23/2021 0937    BMET    Component Value Date/Time   NA 141 06/23/2021 0937   K 4.3 06/23/2021 0937   CL 104 06/23/2021 0937   CO2 31 06/23/2021 0937   GLUCOSE 86 06/23/2021 0937   BUN 12 06/23/2021 0937   CREATININE 0.89 06/23/2021 0937   CALCIUM 9.2 06/23/2021 0937   GFRNONAA 146.08 12/11/2009 0755    BNP No results found for: "BNP"   Imaging:  No results found.        No data to display          No results found for: "NITRICOXIDE"      Assessment & Plan:   OSA (obstructive sleep apnea) Moderate OSA with AHI 19.9 on previous HST. Excellent compliance with CPAP and continues to receive good benefit. Residual AHI 0.8. No significant leaks. Needs rx renewed for supplies - sent to his DME today. Encouraged to continue use.  Patient Instructions  Continue to use CPAP every night, minimum of 4-6 hours a night.  Change equipment every 30 days or as directed by DME. Wash your tubing with warm soap and water daily, hang to dry. Wash humidifier portion weekly.   Be aware of reduced alertness and do not drive or operate heavy machinery if experiencing this or drowsiness.  Exercise encouraged, as tolerated. Avoid or decrease alcohol consumption and medications that make you more sleepy, if possible. Notify if persistent daytime  sleepiness occurs even with consistent use of CPAP.  Labs today   Follow up in 6 months with Dr. Ander Slade. If symptoms do not improve or worsen, please contact office for sooner follow up or seek emergency care.     Nocturnal leg cramps Nocturnal leg cramps. He has a history of these and tends to wax and wane. Recently seems a bit more consistent. Does not appear to be consistent with RLS and no daytime symptoms. He did drink pickle juice before bed last night and did not experience any cramping. On lasix. Check basic labs today - BMET, mag, CBC  Morbid obesity ([place]) Healthy weight loss encouraged.    I spent 35 minutes of dedicated to the care of this patient on the date of this encounter to include pre-visit review of records, face-to-face time with the patient discussing conditions above, post visit ordering of testing, clinical documentation with the electronic health record, making appropriate referrals as documented, and communicating necessary findings to members of the patients care team.  Clayton Bibles, NP 10/25/2021  Pt aware and understands NP's role.

## 2021-10-26 LAB — CBC WITH DIFFERENTIAL/PLATELET
Basophils Absolute: 0 10*3/uL (ref 0.0–0.1)
Basophils Relative: 0.6 % (ref 0.0–3.0)
Eosinophils Absolute: 0.2 10*3/uL (ref 0.0–0.7)
Eosinophils Relative: 2.7 % (ref 0.0–5.0)
HCT: 38.1 % — ABNORMAL LOW (ref 39.0–52.0)
Hemoglobin: 12.3 g/dL — ABNORMAL LOW (ref 13.0–17.0)
Lymphocytes Relative: 16 % (ref 12.0–46.0)
Lymphs Abs: 1 10*3/uL (ref 0.7–4.0)
MCHC: 32.2 g/dL (ref 30.0–36.0)
MCV: 84.3 fl (ref 78.0–100.0)
Monocytes Absolute: 0.5 10*3/uL (ref 0.1–1.0)
Monocytes Relative: 7.9 % (ref 3.0–12.0)
Neutro Abs: 4.5 10*3/uL (ref 1.4–7.7)
Neutrophils Relative %: 72.8 % (ref 43.0–77.0)
Platelets: 195 10*3/uL (ref 150.0–400.0)
RBC: 4.52 Mil/uL (ref 4.22–5.81)
RDW: 14.8 % (ref 11.5–15.5)
WBC: 6.2 10*3/uL (ref 4.0–10.5)

## 2021-10-26 LAB — BASIC METABOLIC PANEL
BUN: 17 mg/dL (ref 6–23)
CO2: 26 mEq/L (ref 19–32)
Calcium: 8.9 mg/dL (ref 8.4–10.5)
Chloride: 106 mEq/L (ref 96–112)
Creatinine, Ser: 0.8 mg/dL (ref 0.40–1.50)
GFR: 89.49 mL/min (ref >60.00)
Glucose, Bld: 115 mg/dL — ABNORMAL HIGH (ref 70–99)
Potassium: 4.2 mEq/L (ref 3.5–5.1)
Sodium: 139 mEq/L (ref 135–145)

## 2021-10-26 LAB — MAGNESIUM: Magnesium: 1.8 mg/dL (ref 1.5–2.5)

## 2021-10-27 ENCOUNTER — Telehealth: Payer: Self-pay | Admitting: Pulmonary Disease

## 2021-10-27 NOTE — Progress Notes (Signed)
Please notify patient that his labs were relatively unremarkable. His hemoglobin is slightly lower than last time (normal is 13-17 and his is 12.3). This will need to be monitored by his PCP. No explanation for his leg cramping. Thanks.

## 2021-10-27 NOTE — Telephone Encounter (Signed)
Printed off copy of sleep study from 2021. Faxed to [place] [facility]. Nothing further needed

## 2021-11-24 ENCOUNTER — Ambulatory Visit (INDEPENDENT_AMBULATORY_CARE_PROVIDER_SITE_OTHER): Payer: Medicare Other | Admitting: Internal Medicine

## 2021-11-24 ENCOUNTER — Other Ambulatory Visit (INDEPENDENT_AMBULATORY_CARE_PROVIDER_SITE_OTHER): Payer: Medicare Other

## 2021-11-24 VITALS — BP 130/74 | HR 74 | Temp 98.3°F | Ht 71.0 in | Wt 279.0 lb

## 2021-11-24 DIAGNOSIS — D649 Anemia, unspecified: Secondary | ICD-10-CM | POA: Diagnosis not present

## 2021-11-24 DIAGNOSIS — E7800 Pure hypercholesterolemia, unspecified: Secondary | ICD-10-CM | POA: Diagnosis not present

## 2021-11-24 DIAGNOSIS — R739 Hyperglycemia, unspecified: Secondary | ICD-10-CM | POA: Diagnosis not present

## 2021-11-24 DIAGNOSIS — E538 Deficiency of other specified B group vitamins: Secondary | ICD-10-CM

## 2021-11-24 DIAGNOSIS — E559 Vitamin D deficiency, unspecified: Secondary | ICD-10-CM

## 2021-11-24 DIAGNOSIS — I10 Essential (primary) hypertension: Secondary | ICD-10-CM

## 2021-11-24 DIAGNOSIS — M79671 Pain in right foot: Secondary | ICD-10-CM

## 2021-11-24 DIAGNOSIS — M79672 Pain in left foot: Secondary | ICD-10-CM

## 2021-11-24 LAB — CBC WITH DIFFERENTIAL/PLATELET
Basophils Absolute: 0 10*3/uL (ref 0.0–0.1)
Basophils Relative: 0.4 % (ref 0.0–3.0)
Eosinophils Absolute: 0.1 10*3/uL (ref 0.0–0.7)
Eosinophils Relative: 1.5 % (ref 0.0–5.0)
HCT: 39.8 % (ref 39.0–52.0)
Hemoglobin: 12.8 g/dL — ABNORMAL LOW (ref 13.0–17.0)
Lymphocytes Relative: 15.6 % (ref 12.0–46.0)
Lymphs Abs: 1 10*3/uL (ref 0.7–4.0)
MCHC: 32.2 g/dL (ref 30.0–36.0)
MCV: 84.8 fl (ref 78.0–100.0)
Monocytes Absolute: 0.6 10*3/uL (ref 0.1–1.0)
Monocytes Relative: 9.7 % (ref 3.0–12.0)
Neutro Abs: 4.7 10*3/uL (ref 1.4–7.7)
Neutrophils Relative %: 72.8 % (ref 43.0–77.0)
Platelets: 207 10*3/uL (ref 150.0–400.0)
RBC: 4.7 Mil/uL (ref 4.22–5.81)
RDW: 14.8 % (ref 11.5–15.5)
WBC: 6.5 10*3/uL (ref 4.0–10.5)

## 2021-11-24 LAB — LIPID PANEL
Cholesterol: 139 mg/dL (ref 0–200)
HDL: 53.8 mg/dL (ref >39.00)
LDL Cholesterol: 70 mg/dL (ref 0–99)
NonHDL: 85.57
Total CHOL/HDL Ratio: 3
Triglycerides: 78 mg/dL (ref 0.0–149.0)
VLDL: 15.6 mg/dL (ref 0.0–40.0)

## 2021-11-24 LAB — BASIC METABOLIC PANEL
BUN: 22 mg/dL (ref 6–23)
CO2: 28 mEq/L (ref 19–32)
Calcium: 9.6 mg/dL (ref 8.4–10.5)
Chloride: 105 mEq/L (ref 96–112)
Creatinine, Ser: 0.84 mg/dL (ref 0.40–1.50)
GFR: 88.13 mL/min (ref >60.00)
Glucose, Bld: 89 mg/dL (ref 70–99)
Potassium: 4.1 mEq/L (ref 3.5–5.1)
Sodium: 141 mEq/L (ref 135–145)

## 2021-11-24 LAB — IBC PANEL
Iron: 32 ug/dL — ABNORMAL LOW (ref 42–165)
Saturation Ratios: 8.3 % — ABNORMAL LOW (ref 20.0–50.0)
TIBC: 383.6 ug/dL (ref 250.0–450.0)
Transferrin: 274 mg/dL (ref 212.0–360.0)

## 2021-11-24 LAB — HEPATIC FUNCTION PANEL
ALT: 14 U/L (ref 0–53)
AST: 16 U/L (ref 0–37)
Albumin: 3.9 g/dL (ref 3.5–5.2)
Alkaline Phosphatase: 76 U/L (ref 39–117)
Bilirubin, Direct: 0.1 mg/dL (ref 0.0–0.3)
Total Bilirubin: 0.5 mg/dL (ref 0.2–1.2)
Total Protein: 6.8 g/dL (ref 6.0–8.3)

## 2021-11-24 LAB — HEMOGLOBIN A1C: Hgb A1c MFr Bld: 6.3 % (ref 4.6–6.5)

## 2021-11-24 NOTE — Patient Instructions (Addendum)
Please continue all other medications as before, and refills have been done if requested.  Please have the pharmacy call with any other refills you may need.  Please continue your efforts at being more active, low cholesterol diet, and weight control.  You are otherwise up to date with prevention measures today.  Please keep your appointments with your specialists as you may have planned  You will be contacted regarding the referral for: Dr Doran Durand at [facility]  Please go to the LAB at the blood drawing area for the tests to be done - at the [facility], then we wont need further labs in october  You will be contacted by phone if any changes need to be made immediately.  Otherwise, you will receive a letter about your results with an explanation, but please check with MyChart first.  Please remember to sign up for MyChart if you have not done so, as this will be important to you in the future with finding out test results, communicating by private email, and scheduling acute appointments online when needed.

## 2021-11-24 NOTE — Progress Notes (Unsigned)
Patient ID: Joshua Ishihara Sr., male   DOB: May 25, 1950, 71 y.o.   MRN: [id number]        Chief Complaint: follow up recent elevated glucose 115, anemia, chronic bilateral foot pain, CKD       HPI:  Joshua Ishihara Sr. is a 71 y.o. male here overall doing ok, Pt denies chest pain, increased sob or doe, wheezing, orthopnea, PND, increased LE swelling, palpitations, dizziness or syncope.   Pt denies polydipsia, polyuria, or new focal neuro s/s.   Pt denies fever, wt loss, night sweats, loss of appetite, or other constitutional symptoms  Has chronic bilateral foot and ankle pain with increased difficutly with ambulation in past 2 mo, asks for referral.  No overt bleeding recent, no hx of GI bleed or anemia.         Wt Readings from Last 3 Encounters:  11/24/21 279 lb (126.6 kg)  10/25/21 281 lb 3.2 oz (127.6 kg)  06/23/21 280 lb (127 kg)   BP Readings from Last 3 Encounters:  11/24/21 130/74  10/25/21 134/60  06/23/21 138/72         Past Medical History:  Diagnosis Date   Acute pharyngitis 01/26/2009   ALLERGIC RHINITIS 08/07/2007   Allergy    ARTHRITIS 08/07/2007   BACK PAIN 09/15/2009   BENIGN PROSTATIC HYPERTROPHY 05/20/2008   Cataract    removed bilaterally    EATING DISORDER, HX OF 08/07/2007   ERECTILE DYSFUNCTION 02/18/2008   GERD (gastroesophageal reflux disease)    HYPERLIPIDEMIA 08/07/2007   HYPERTENSION 08/07/2007   NECK PAIN, RIGHT 09/15/2009   Neuromuscular disorder ([place])    carpal tunnel    OSTEOARTHRITIS, KNEES, BILATERAL 09/15/2009   SINUSITIS- ACUTE-NOS 04/30/2010   Sleep apnea    weas cpap    Past Surgical History:  Procedure Laterality Date   COLONOSCOPY     GASTRIC BYPASS  2006   Left foot surgery  2008   Lower back  2004   POLYPECTOMY     Right knee arthroscopy  1994   UPPER GASTROINTESTINAL ENDOSCOPY      reports that he has never smoked. He has never used smokeless tobacco. He reports current alcohol use. He reports that he does not use drugs. family  history includes Heart disease in his father; Hyperlipidemia in his father; Hypertension in his father. Allergies  Allergen Reactions   Ace Inhibitors     REACTION: tongue swelling   Current Outpatient Medications on File Prior to Visit  Medication Sig Dispense Refill   acetaminophen (TYLENOL) 500 MG tablet Take 500 mg by mouth every 6 (six) hours as needed.     amLODipine (NORVASC) 5 MG tablet TAKE 1 TABLET BY MOUTH EVERY DAY 90 tablet 3   atorvastatin (LIPITOR) 40 MG tablet Take 1 tablet (40 mg total) by mouth daily. 90 tablet 3   Cholecalciferol (VITAMIN D3 PO) Take by mouth.     desloratadine (CLARINEX) 5 MG tablet TAKE 1 TABLET BY MOUTH EVERY DAY (Patient not taking: Reported on 10/25/2021) 90 tablet 3   Diclofenac Sodium (PENNSAID) 2 % SOLN Place 2 application onto the skin 2 (two) times daily. 112 g 3   furosemide (LASIX) 20 MG tablet 1 tab by mouth in the AM, and 1 in the PM as needed for persistent swelling 180 tablet 3   irbesartan (AVAPRO) 150 MG tablet Take 1 tablet (150 mg total) by mouth daily. 90 tablet 3   meloxicam (MOBIC) 15 MG tablet Take 1 tablet (15 mg  total) by mouth daily. (Patient not taking: Reported on 10/25/2021) 30 tablet 0   Omega-3 Krill Oil 500 MG CAPS Take 1 capsule by mouth daily.     Probiotic Product (PROBIOTIC DAILY PO) Take by mouth. [employer] colon health     Semaglutide-Weight Management (WEGOVY) 0.5 MG/0.5ML SOAJ Inject 0.5 mg into the skin once a week. (Patient not taking: Reported on 10/25/2021) 6 mL 3   Sodium Sulfate-Mag Sulfate-KCl (SUTAB) [phone] MG TABS Take 24 tablets by mouth as directed. 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 BIN: [id number] PCN: CN GROUP: [id number] MEMBER ID: [id number];SLH AS SECONDARY INSURANCE ;NO PRIOR AUTHORIZATION 24 tablet 0   tadalafil (CIALIS) 20 MG tablet TAKE 1 TABLET BY MOUTH EVERY DAY AS NEEDED (Patient not taking: Reported on 10/25/2021) 6 tablet 9   tamsulosin (FLOMAX) 0.4 MG CAPS capsule Take 0.8 mg by mouth daily.     tiZANidine  (ZANAFLEX) 2 MG tablet TAKE 1 TABLET BY MOUTH EVERYDAY AT BEDTIME 90 tablet 1   traMADol (ULTRAM) 50 MG tablet TAKE 1 TABLET BY MOUTH EVERY 6 HOURS AS NEEDED FOR MODERATE PAIN 120 tablet 5   Turmeric 500 MG CAPS Take 1,400 mg by mouth 2 (two) times daily.     No current facility-administered medications on file prior to visit.        ROS:  All others reviewed and negative.  Objective        PE:  BP 130/74 (BP Location: Right Arm, Patient Position: Sitting, Cuff Size: Large)   Pulse 74   Temp 98.3 F (36.8 C) (Oral)   Ht '5\' 11"'$  (1.803 m)   Wt 279 lb (126.6 kg)   SpO2 94%   BMI 38.91 kg/m                 Constitutional: Pt appears in NAD               HENT: Head: NCAT.                Right Ear: External ear normal.                 Left Ear: External ear normal.                Eyes: . Pupils are equal, round, and reactive to light. Conjunctivae and EOM are normal               Nose: without d/c or deformity               Neck: Neck supple. Gross normal ROM               Cardiovascular: Normal rate and regular rhythm.                 Pulmonary/Chest: Effort normal and breath sounds without rales or wheezing.                Abd:  Soft, NT, ND, + BS, no organomegaly               Neurological: Pt is alert. At baseline orientation, motor grossly intact               Skin: Skin is warm. No rashes, no other new lesions, LE edema - none               Psychiatric: Pt behavior is normal without agitation   Micro: none  Cardiac tracings I have personally interpreted today:  none  Pertinent Radiological findings (summarize): none  Lab Results  Component Value Date   WBC 6.5 11/24/2021   HGB 12.8 (L) 11/24/2021   HCT 39.8 11/24/2021   PLT 207.0 11/24/2021   GLUCOSE 89 11/24/2021   CHOL 139 11/24/2021   TRIG 78.0 11/24/2021   HDL 53.80 11/24/2021   LDLDIRECT 157.6 01/02/2009   LDLCALC 70 11/24/2021   ALT 14 11/24/2021   AST 16 11/24/2021   NA 141 11/24/2021   K 4.1 11/24/2021    CL 105 11/24/2021   CREATININE 0.84 11/24/2021   BUN 22 11/24/2021   CO2 28 11/24/2021   TSH 1.20 06/23/2021   PSA 1.44 06/23/2021   HGBA1C 6.3 11/24/2021   MICROALBUR <0.7 06/23/2021   Assessment/Plan:  Joshua Ishihara Sr. is a 71 y.o. Black or African American [2] male with  has a past medical history of Acute pharyngitis (01/26/2009), ALLERGIC RHINITIS (08/07/2007), Allergy, ARTHRITIS (08/07/2007), BACK PAIN (09/15/2009), BENIGN PROSTATIC HYPERTROPHY (05/20/2008), Cataract, EATING DISORDER, HX OF (08/07/2007), ERECTILE DYSFUNCTION (02/18/2008), GERD (gastroesophageal reflux disease), HYPERLIPIDEMIA (08/07/2007), HYPERTENSION (08/07/2007), NECK PAIN, RIGHT (09/15/2009), Neuromuscular disorder ([place]), OSTEOARTHRITIS, KNEES, BILATERAL (09/15/2009), SINUSITIS- ACUTE-NOS (04/30/2010), and Sleep apnea.  B12 deficiency Lab Results  Component Value Date   VITAMINB12 >1500 (H) 11/24/2021   Stable, cont oral replacement - b12 1000 mcg qd   Essential hypertension BP Readings from Last 3 Encounters:  11/24/21 130/74  10/25/21 134/60  06/23/21 138/72   Stable, pt to continue medical treatment norvasc 5 mg qd, avapro 150 qd    HLD (hyperlipidemia) Lab Results  Component Value Date   LDLCALC 70 11/24/2021   Stable, pt to continue current statin lipitor 40 mg   Hyperglycemia Lab Results  Component Value Date   HGBA1C 6.3 11/24/2021   Stable, pt to continue current medical treatment wegovy  Vitamin D deficiency Last vitamin D Lab Results  Component Value Date   VD25OH 53.04 11/24/2021   Stable, cont oral replacement   Bilateral foot pain Chronic persistent worsening - for ortho referral  Anemia Recent onset, mild, for iron lab, consider supplement and/or GI referral  Followup: Return if symptoms worsen or fail to improve.  Cathlean Cower, MD 11/25/2021 9:41 PM [facility]

## 2021-11-25 ENCOUNTER — Encounter: Payer: Self-pay | Admitting: Internal Medicine

## 2021-11-25 ENCOUNTER — Other Ambulatory Visit: Payer: Self-pay | Admitting: Internal Medicine

## 2021-11-25 DIAGNOSIS — M79672 Pain in left foot: Secondary | ICD-10-CM | POA: Insufficient documentation

## 2021-11-25 DIAGNOSIS — D509 Iron deficiency anemia, unspecified: Secondary | ICD-10-CM

## 2021-11-25 DIAGNOSIS — D649 Anemia, unspecified: Secondary | ICD-10-CM | POA: Insufficient documentation

## 2021-11-25 LAB — VITAMIN D 25 HYDROXY (VIT D DEFICIENCY, FRACTURES): VITD: 53.04 ng/mL (ref 30.00–100.00)

## 2021-11-25 LAB — VITAMIN B12: Vitamin B-12: >1500 pg/mL — ABNORMAL HIGH (ref 211–911)

## 2021-11-25 LAB — FERRITIN: Ferritin: 6 ng/mL — ABNORMAL LOW (ref 22.0–322.0)

## 2021-11-25 MED ORDER — POLYSACCHARIDE IRON COMPLEX 150 MG PO CAPS: 150.0000 mg | ORAL_CAPSULE | Freq: Every day | ORAL | 1 refills | Status: DC

## 2021-11-25 NOTE — Assessment & Plan Note (Signed)
BP Readings from Last 3 Encounters:  11/24/21 130/74  10/25/21 134/60  06/23/21 138/72   Stable, pt to continue medical treatment norvasc 5 mg qd, avapro 150 qd

## 2021-11-25 NOTE — Assessment & Plan Note (Signed)
Chronic persistent worsening - for ortho referral

## 2021-11-25 NOTE — Assessment & Plan Note (Signed)
Lab Results  Component Value Date   HGBA1C 6.3 11/24/2021   Stable, pt to continue current medical treatment wegovy

## 2021-11-25 NOTE — Assessment & Plan Note (Signed)
Lab Results  Component Value Date   VITAMINB12 >1500 (H) 11/24/2021   Stable, cont oral replacement - b12 1000 mcg qd  

## 2021-11-25 NOTE — Assessment & Plan Note (Signed)
Last vitamin D Lab Results  Component Value Date   VD25OH 53.04 11/24/2021   Stable, cont oral replacement  

## 2021-11-25 NOTE — Assessment & Plan Note (Signed)
Recent onset, mild, for iron lab, consider supplement and/or GI referral

## 2021-11-25 NOTE — Assessment & Plan Note (Signed)
Lab Results  Component Value Date   LDLCALC 70 11/24/2021   Stable, pt to continue current statin lipitor 40 mg

## 2021-12-08 ENCOUNTER — Encounter: Payer: Self-pay | Admitting: Internal Medicine

## 2021-12-15 DIAGNOSIS — M67979 Unspecified disorder of synovium and tendon, unspecified ankle and foot: Secondary | ICD-10-CM | POA: Insufficient documentation

## 2021-12-23 ENCOUNTER — Ambulatory Visit (INDEPENDENT_AMBULATORY_CARE_PROVIDER_SITE_OTHER): Payer: Medicare Other | Admitting: Internal Medicine

## 2021-12-23 VITALS — BP 120/66 | HR 72 | Temp 97.7°F | Ht 71.0 in | Wt 280.0 lb

## 2021-12-23 DIAGNOSIS — R739 Hyperglycemia, unspecified: Secondary | ICD-10-CM

## 2021-12-23 DIAGNOSIS — E7800 Pure hypercholesterolemia, unspecified: Secondary | ICD-10-CM | POA: Diagnosis not present

## 2021-12-23 DIAGNOSIS — D509 Iron deficiency anemia, unspecified: Secondary | ICD-10-CM | POA: Diagnosis not present

## 2021-12-23 DIAGNOSIS — I10 Essential (primary) hypertension: Secondary | ICD-10-CM

## 2021-12-23 DIAGNOSIS — E559 Vitamin D deficiency, unspecified: Secondary | ICD-10-CM

## 2021-12-23 LAB — CBC WITH DIFFERENTIAL/PLATELET
Basophils Absolute: 0 10*3/uL (ref 0.0–0.1)
Basophils Relative: 0.6 % (ref 0.0–3.0)
Eosinophils Absolute: 0.1 10*3/uL (ref 0.0–0.7)
Eosinophils Relative: 2.2 % (ref 0.0–5.0)
HCT: 37.8 % — ABNORMAL LOW (ref 39.0–52.0)
Hemoglobin: 12.5 g/dL — ABNORMAL LOW (ref 13.0–17.0)
Lymphocytes Relative: 17.2 % (ref 12.0–46.0)
Lymphs Abs: 1 10*3/uL (ref 0.7–4.0)
MCHC: 33 g/dL (ref 30.0–36.0)
MCV: 83.3 fl (ref 78.0–100.0)
Monocytes Absolute: 0.7 10*3/uL (ref 0.1–1.0)
Monocytes Relative: 11.3 % (ref 3.0–12.0)
Neutro Abs: 4.1 10*3/uL (ref 1.4–7.7)
Neutrophils Relative %: 68.7 % (ref 43.0–77.0)
Platelets: 202 10*3/uL (ref 150.0–400.0)
RBC: 4.53 Mil/uL (ref 4.22–5.81)
RDW: 14.9 % (ref 11.5–15.5)
WBC: 6 10*3/uL (ref 4.0–10.5)

## 2021-12-23 LAB — IBC PANEL
Iron: 60 ug/dL (ref 42–165)
Saturation Ratios: 15.9 % — ABNORMAL LOW (ref 20.0–50.0)
TIBC: 378 ug/dL (ref 250.0–450.0)
Transferrin: 270 mg/dL (ref 212.0–360.0)

## 2021-12-23 LAB — FERRITIN: Ferritin: 5.5 ng/mL — ABNORMAL LOW (ref 22.0–322.0)

## 2021-12-23 NOTE — Patient Instructions (Signed)
Please continue all other medications as before, including the iron for now  Please have the pharmacy call with any other refills you may need.  Please continue your efforts at being more active, low cholesterol diet, and weight control.  Please keep your appointments with your specialists as you may have planned  - GI  Please go to the LAB at the blood drawing area for the tests to be done  You will be contacted by phone if any changes need to be made immediately.  Otherwise, you will receive a letter about your results with an explanation, but please check with MyChart first.   Please remember to sign up for MyChart if you have not done so, as this will be important to you in the future with finding out test results, communicating by private email, and scheduling acute appointments online when needed.  Please make an Appointment to return in 6 months, or sooner if needed

## 2021-12-23 NOTE — Progress Notes (Signed)
Patient ID: Joshua Ishihara Sr., male   DOB: 27-Nov-1950, 71 y.o.   MRN: [id number]        Chief Complaint: follow up HTN, HLD and hyperglycemia, obesity, low vit d and iron deficiency       HPI:  Joshua Pugh Sr. is a 71 y.o. male here overall doing ok.  No overt bleeding or bruising.   Iron lab just prior to this visit with low ferritin but iron improved hgb stable 12.5  Has appt with GI related to iron def anemia eval on Oct 26, [place].   Pt has hx of gastric bypass about 2005, not taking recently the MVI.  Pt denies chest pain, increased sob or doe, wheezing, orthopnea, PND, increased LE swelling, palpitations, dizziness or syncope.   Pt denies polydipsia, polyuria, or new focal neuro s/s.   Saw ortho for right ankle , now iwith right brace, mild arthrits only but has tendonitis, now for PT to start soon.  [facility] for wt loss was too expensive, did not start  Wt Readings from Last 3 Encounters:  12/23/21 280 lb (127 kg)  11/24/21 279 lb (126.6 kg)  10/25/21 281 lb 3.2 oz (127.6 kg)   BP Readings from Last 3 Encounters:  12/23/21 120/66  11/24/21 130/74  10/25/21 134/60         Past Medical History:  Diagnosis Date   Acute pharyngitis 01/26/2009   ALLERGIC RHINITIS 08/07/2007   Allergy    ARTHRITIS 08/07/2007   BACK PAIN 09/15/2009   BENIGN PROSTATIC HYPERTROPHY 05/20/2008   Cataract    removed bilaterally    EATING DISORDER, HX OF 08/07/2007   ERECTILE DYSFUNCTION 02/18/2008   GERD (gastroesophageal reflux disease)    HYPERLIPIDEMIA 08/07/2007   HYPERTENSION 08/07/2007   NECK PAIN, RIGHT 09/15/2009   Neuromuscular disorder ([place])    carpal tunnel    OSTEOARTHRITIS, KNEES, BILATERAL 09/15/2009   SINUSITIS- ACUTE-NOS 04/30/2010   Sleep apnea    weas cpap    Past Surgical History:  Procedure Laterality Date   COLONOSCOPY     GASTRIC BYPASS  2006   Left foot surgery  2008   Lower back  2004   POLYPECTOMY     Right knee arthroscopy  1994   UPPER GASTROINTESTINAL ENDOSCOPY       reports that he has never smoked. He has never used smokeless tobacco. He reports current alcohol use. He reports that he does not use drugs. family history includes Heart disease in his father; Hyperlipidemia in his father; Hypertension in his father. Allergies  Allergen Reactions   Ace Inhibitors     REACTION: tongue swelling   Current Outpatient Medications on File Prior to Visit  Medication Sig Dispense Refill   acetaminophen (TYLENOL) 500 MG tablet Take 500 mg by mouth every 6 (six) hours as needed.     amLODipine (NORVASC) 5 MG tablet TAKE 1 TABLET BY MOUTH EVERY DAY 90 tablet 3   atorvastatin (LIPITOR) 40 MG tablet Take 1 tablet (40 mg total) by mouth daily. 90 tablet 3   Cholecalciferol (VITAMIN D3 PO) Take by mouth.     Diclofenac Sodium (PENNSAID) 2 % SOLN Place 2 application onto the skin 2 (two) times daily. 112 g 3   furosemide (LASIX) 20 MG tablet 1 tab by mouth in the AM, and 1 in the PM as needed for persistent swelling 180 tablet 3   irbesartan (AVAPRO) 150 MG tablet Take 1 tablet (150 mg total) by mouth daily. [place]  tablet 3   iron polysaccharides (NU-IRON) 150 MG capsule Take 1 capsule (150 mg total) by mouth daily. 90 capsule 1   Omega-3 Krill Oil 500 MG CAPS Take 1 capsule by mouth daily.     Probiotic Product (PROBIOTIC DAILY PO) Take by mouth. [employer] colon health     Sodium Sulfate-Mag Sulfate-KCl (SUTAB) [phone] MG TABS Take 24 tablets by mouth as directed. 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 BIN: [id number] PCN: CN GROUP: [id number] MEMBER ID: [id number];CXK AS SECONDARY INSURANCE ;NO PRIOR AUTHORIZATION 24 tablet 0   tamsulosin (FLOMAX) 0.4 MG CAPS capsule Take 0.8 mg by mouth daily.     tiZANidine (ZANAFLEX) 2 MG tablet TAKE 1 TABLET BY MOUTH EVERYDAY AT BEDTIME 90 tablet 1   traMADol (ULTRAM) 50 MG tablet TAKE 1 TABLET BY MOUTH EVERY 6 HOURS AS NEEDED FOR MODERATE PAIN 120 tablet 5   Turmeric 500 MG CAPS Take 1,400 mg by mouth 2 (two) times daily.     acetaminophen (TYLENOL  8 HOUR) 650 MG CR tablet 4 tablets twice a day by oral route.     amLODipine (NORVASC) 5 MG tablet Take 1 tablet by mouth daily.     atorvastatin (LIPITOR) 40 MG tablet Take 1 tablet by mouth daily.     Bacillus Coagulans-Inulin (PROBIOTIC) 1-250 BILLION-MG CAPS Probiotic     Cholecalciferol 250 MCG (10000 UT) TABS cholecalciferol (vitamin D3) 250 mcg (10,000 unit) tablet   1 tablet every day by oral route.     desloratadine (CLARINEX) 5 MG tablet TAKE 1 TABLET BY MOUTH EVERY DAY (Patient not taking: Reported on 10/25/2021) 90 tablet 3   FISH OIL-KRILL OIL PO 500 mg.     meloxicam (MOBIC) 15 MG tablet Take 1 tablet (15 mg total) by mouth daily. (Patient not taking: Reported on 10/25/2021) 30 tablet 0   Semaglutide-Weight Management (WEGOVY) 0.5 MG/0.5ML SOAJ Inject 0.5 mg into the skin once a week. (Patient not taking: Reported on 10/25/2021) 6 mL 3   tadalafil (CIALIS) 20 MG tablet TAKE 1 TABLET BY MOUTH EVERY DAY AS NEEDED (Patient not taking: Reported on 10/25/2021) 6 tablet 9   tadalafil (CIALIS) 20 MG tablet tadalafil 20 mg tablet   1 tablet as needed by oral route.     traMADol (ULTRAM) 50 MG tablet Take 1 tablet by mouth every 6 (six) hours as needed.     Turmeric 1053 MG TABS turmeric     No current facility-administered medications on file prior to visit.        ROS:  All others reviewed and negative.  Objective        PE:  BP 120/66 (BP Location: Right Arm, Patient Position: Sitting, Cuff Size: Large)   Pulse 72   Temp 97.7 F (36.5 C) (Oral)   Ht '5\' 11"'$  (1.803 m)   Wt 280 lb (127 kg)   SpO2 92%   BMI 39.05 kg/m                 Constitutional: Pt appears in NAD               HENT: Head: NCAT.                Right Ear: External ear normal.                 Left Ear: External ear normal.                Eyes: . Pupils are equal, round, and reactive to light. Conjunctivae  and EOM are normal               Nose: without d/c or deformity               Neck: Neck supple. Gross  normal ROM               Cardiovascular: Normal rate and regular rhythm.                 Pulmonary/Chest: Effort normal and breath sounds without rales or wheezing.                Abd:  Soft, NT, ND, + BS, no organomegaly               Neurological: Pt is alert. At baseline orientation, motor grossly intact               Skin: Skin is warm. No rashes, no other new lesions, LE edema - trace bilateral               Psychiatric: Pt behavior is normal without agitation   Micro: none  Cardiac tracings I have personally interpreted today:  none  Pertinent Radiological findings (summarize): none   Lab Results  Component Value Date   WBC 6.0 12/23/2021   HGB 12.5 (L) 12/23/2021   HCT 37.8 (L) 12/23/2021   PLT 202.0 12/23/2021   GLUCOSE 89 11/24/2021   CHOL 139 11/24/2021   TRIG 78.0 11/24/2021   HDL 53.80 11/24/2021   LDLDIRECT 157.6 01/02/2009   LDLCALC 70 11/24/2021   ALT 14 11/24/2021   AST 16 11/24/2021   NA 141 11/24/2021   K 4.1 11/24/2021   CL 105 11/24/2021   CREATININE 0.84 11/24/2021   BUN 22 11/24/2021   CO2 28 11/24/2021   TSH 1.20 06/23/2021   PSA 1.44 06/23/2021   HGBA1C 6.3 11/24/2021   MICROALBUR <0.7 06/23/2021   Assessment/Plan:  Joshua Ishihara Sr. is a 71 y.o. Black or African American [2] male with  has a past medical history of Acute pharyngitis (01/26/2009), ALLERGIC RHINITIS (08/07/2007), Allergy, ARTHRITIS (08/07/2007), BACK PAIN (09/15/2009), BENIGN PROSTATIC HYPERTROPHY (05/20/2008), Cataract, EATING DISORDER, HX OF (08/07/2007), ERECTILE DYSFUNCTION (02/18/2008), GERD (gastroesophageal reflux disease), HYPERLIPIDEMIA (08/07/2007), HYPERTENSION (08/07/2007), NECK PAIN, RIGHT (09/15/2009), Neuromuscular disorder ([place]), OSTEOARTHRITIS, KNEES, BILATERAL (09/15/2009), SINUSITIS- ACUTE-NOS (04/30/2010), and Sleep apnea.  Vitamin D deficiency Last vitamin D Lab Results  Component Value Date   VD25OH 53.04 11/24/2021   Stable, cont oral  replacement   Hyperglycemia Lab Results  Component Value Date   HGBA1C 6.3 11/24/2021   Stable, pt to continue current medical treatment  - diet, wt control excercise   HLD (hyperlipidemia) Lab Results  Component Value Date   LDLCALC 70 11/24/2021   Uncontrolled, goal ldl < 70, pt to continue current statin ipitor 40 mg and lower chol diet   Essential hypertension BP Readings from Last 3 Encounters:  12/23/21 120/66  11/24/21 130/74  10/25/21 134/60   Stable, pt to continue medical treatment norvasc 5 mg qd, avapro 150 qd   Anemia Also for f/u labs with next visit  Followup: Return in about 6 months (around 06/24/2022).  Cathlean Cower, MD 12/26/2021 3:00 PM [facility]

## 2021-12-26 ENCOUNTER — Encounter: Payer: Self-pay | Admitting: Internal Medicine

## 2021-12-26 NOTE — Assessment & Plan Note (Signed)
Last vitamin D Lab Results  Component Value Date   VD25OH 53.04 11/24/2021   Stable, cont oral replacement  

## 2021-12-26 NOTE — Assessment & Plan Note (Signed)
Lab Results  Component Value Date   HGBA1C 6.3 11/24/2021   Stable, pt to continue current medical treatment  - diet, wt control excercise

## 2021-12-26 NOTE — Assessment & Plan Note (Signed)
Lab Results  Component Value Date   LDLCALC 70 11/24/2021   Uncontrolled, goal ldl < 70, pt to continue current statin ipitor 40 mg and lower chol diet

## 2021-12-26 NOTE — Assessment & Plan Note (Signed)
BP Readings from Last 3 Encounters:  12/23/21 120/66  11/24/21 130/74  10/25/21 134/60   Stable, pt to continue medical treatment norvasc 5 mg qd, avapro 150 qd

## 2021-12-26 NOTE — Assessment & Plan Note (Signed)
Also for f/u labs with next visit

## 2021-12-30 ENCOUNTER — Ambulatory Visit (INDEPENDENT_AMBULATORY_CARE_PROVIDER_SITE_OTHER): Payer: Medicare Other | Admitting: Physician Assistant

## 2021-12-30 ENCOUNTER — Encounter: Payer: Self-pay | Admitting: Physician Assistant

## 2021-12-30 VITALS — BP 128/76 | HR 68 | Ht 71.0 in | Wt 281.3 lb

## 2021-12-30 DIAGNOSIS — D509 Iron deficiency anemia, unspecified: Secondary | ICD-10-CM

## 2021-12-30 NOTE — Patient Instructions (Signed)
You have been scheduled for an endoscopy. Please follow written instructions given to you at your visit today. If you use inhalers (even only as needed), please bring them with you on the day of your procedure.  _______________________________________________________  If you are age 71 or older, your body mass index should be between 23-30. Your Body mass index is 39.24 kg/m. If this is out of the aforementioned range listed, please consider follow up with your Primary Care Provider.  If you are age 28 or younger, your body mass index should be between 19-25. Your Body mass index is 39.24 kg/m. If this is out of the aformentioned range listed, please consider follow up with your Primary Care Provider.   ________________________________________________________  The [facility] providers would like to encourage you to use [facility] to communicate with providers for non-urgent requests or questions.  Due to long hold times on the telephone, sending your provider a message by [facility] may be a faster and more efficient way to get a response.  Please allow 48 business hours for a response.  Please remember that this is for non-urgent requests.  _______________________________________________________  Thank you for choosing me and [facility].  Ellouise Newer PA

## 2021-12-30 NOTE — Progress Notes (Signed)
Agree with assessment and plan as outlined.  

## 2021-12-30 NOTE — Progress Notes (Signed)
Chief Complaint: Iron Deficiency Anemia  HPI:  Mr. Joshua Burnett. is a  71 y/o AA male who was referred to me by Biagio Borg, MD for a complaint of iron deficiency anemia .      06/30/2020 colonoscopy with 10 polyps removed throughout the colon, diverticulosis in the left and right colon, internal hemorrhoids and otherwise normal.  Pathology showed adenomatous and repeat recommended in 3 years.    12/23/2021 CBC with a hemoglobin of 12.5 (12.8 a month ago).  Iron panel within percent saturation low at 15.9 and ferritin low at 5.5.    Today, patient tells me that he was found to be anemic just recently and has been on iron supplementation over the past month or so.  Tells me this has never happened to him before though he is status post gastric bypass many years ago and after that procedure was told to stay on a multivitamin and stopped it several months ago.  He has restarted it now along with iron supplementation.  Denies any changes in his GI system and really has no GI complaints.  In general he is feeling fairly well other than having some work done on his right ankle.    Denies fever, chills, weight loss, abdominal pain, heartburn, reflux, change in bowel habits or symptoms that awaken him from sleep.  Past Medical History:  Diagnosis Date   Acute pharyngitis 01/26/2009   ALLERGIC RHINITIS 08/07/2007   Allergy    ARTHRITIS 08/07/2007   BACK PAIN 09/15/2009   BENIGN PROSTATIC HYPERTROPHY 05/20/2008   Cataract    removed bilaterally    EATING DISORDER, HX OF 08/07/2007   ERECTILE DYSFUNCTION 02/18/2008   GERD (gastroesophageal reflux disease)    HYPERLIPIDEMIA 08/07/2007   HYPERTENSION 08/07/2007   NECK PAIN, RIGHT 09/15/2009   Neuromuscular disorder ([place])    carpal tunnel    OSTEOARTHRITIS, KNEES, BILATERAL 09/15/2009   SINUSITIS- ACUTE-NOS 04/30/2010   Sleep apnea    weas cpap     Past Surgical History:  Procedure Laterality Date   COLONOSCOPY     GASTRIC BYPASS  2006   Left foot surgery   2008   Lower back  2004   POLYPECTOMY     Right knee arthroscopy  1994   UPPER GASTROINTESTINAL ENDOSCOPY      Current Outpatient Medications  Medication Sig Dispense Refill   acetaminophen (TYLENOL 8 HOUR) 650 MG CR tablet 4 tablets twice a day by oral route.     acetaminophen (TYLENOL) 500 MG tablet Take 500 mg by mouth every 6 (six) hours as needed.     amLODipine (NORVASC) 5 MG tablet TAKE 1 TABLET BY MOUTH EVERY DAY 90 tablet 3   amLODipine (NORVASC) 5 MG tablet Take 1 tablet by mouth daily.     atorvastatin (LIPITOR) 40 MG tablet Take 1 tablet (40 mg total) by mouth daily. 90 tablet 3   atorvastatin (LIPITOR) 40 MG tablet Take 1 tablet by mouth daily.     Bacillus Coagulans-Inulin (PROBIOTIC) 1-250 BILLION-MG CAPS Probiotic     Cholecalciferol (VITAMIN D3 PO) Take by mouth.     Cholecalciferol 250 MCG (10000 UT) TABS cholecalciferol (vitamin D3) 250 mcg (10,000 unit) tablet   1 tablet every day by oral route.     Diclofenac Sodium (PENNSAID) 2 % SOLN Place 2 application onto the skin 2 (two) times daily. 112 g 3   FISH OIL-KRILL OIL PO 500 mg.     furosemide (LASIX) 20 MG tablet 1  tab by mouth in the AM, and 1 in the PM as needed for persistent swelling 180 tablet 3   irbesartan (AVAPRO) 150 MG tablet Take 1 tablet (150 mg total) by mouth daily. 90 tablet 3   iron polysaccharides (NU-IRON) 150 MG capsule Take 1 capsule (150 mg total) by mouth daily. 90 capsule 1   Omega-3 Krill Oil 500 MG CAPS Take 1 capsule by mouth daily.     Probiotic Product (PROBIOTIC DAILY PO) Take by mouth. [employer] colon health     Sodium Sulfate-Mag Sulfate-KCl (SUTAB) [phone] MG TABS Take 24 tablets by mouth as directed. 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 BIN: [id number] PCN: CN GROUP: [id number] MEMBER ID: [id number];[employer]X AS SECONDARY INSURANCE ;NO PRIOR AUTHORIZATION 24 tablet 0   tamsulosin (FLOMAX) 0.4 MG CAPS capsule Take 0.8 mg by mouth daily.     tiZANidine (ZANAFLEX) 2 MG tablet TAKE 1 TABLET BY MOUTH EVERYDAY  AT BEDTIME 90 tablet 1   traMADol (ULTRAM) 50 MG tablet TAKE 1 TABLET BY MOUTH EVERY 6 HOURS AS NEEDED FOR MODERATE PAIN 120 tablet 5   traMADol (ULTRAM) 50 MG tablet Take 1 tablet by mouth every 6 (six) hours as needed.     Turmeric 1053 MG TABS turmeric     Turmeric 500 MG CAPS Take 1,400 mg by mouth 2 (two) times daily.     desloratadine (CLARINEX) 5 MG tablet TAKE 1 TABLET BY MOUTH EVERY DAY (Patient not taking: Reported on 10/25/2021) 90 tablet 3   meloxicam (MOBIC) 15 MG tablet Take 1 tablet (15 mg total) by mouth daily. (Patient not taking: Reported on 10/25/2021) 30 tablet 0   Semaglutide-Weight Management (WEGOVY) 0.5 MG/0.5ML SOAJ Inject 0.5 mg into the skin once a week. (Patient not taking: Reported on 10/25/2021) 6 mL 3   tadalafil (CIALIS) 20 MG tablet TAKE 1 TABLET BY MOUTH EVERY DAY AS NEEDED (Patient not taking: Reported on 10/25/2021) 6 tablet 9   tadalafil (CIALIS) 20 MG tablet tadalafil 20 mg tablet   1 tablet as needed by oral route. (Patient not taking: Reported on 12/30/2021)     No current facility-administered medications for this visit.    Allergies as of 12/30/2021 - Review Complete 12/30/2021  Allergen Reaction Noted   Ace inhibitors  08/07/2007    Family History  Problem Relation Age of Onset   Hyperlipidemia Father    Heart disease Father    Hypertension Father    Colon polyps Neg Hx    Esophageal cancer Neg Hx    Rectal cancer Neg Hx    Stomach cancer Neg Hx    Pancreatic cancer Neg Hx    Colon cancer Neg Hx     Social History   Socioeconomic History   Marital status: Married    Spouse name: Not on file   Number of children: Not on file   Years of education: Not on file   Highest education level: Not on file  Occupational History   Not on file  Tobacco Use   Smoking status: Never   Smokeless tobacco: Never  Vaping Use   Vaping Use: Never used  Substance and Sexual Activity   Alcohol use: Yes    Comment: occasional beer    Drug use: No    Sexual activity: Not on file  Other Topics Concern   Not on file  Social History Narrative   Not on file   Social Determinants of Health   Financial Resource Strain: Not on file  Food Insecurity: Not on file  Transportation Needs: Not on file  Physical Activity: Not on file  Stress: Not on file  Social Connections: Not on file  Intimate Partner Violence: Not on file    Review of Systems:    Constitutional: No weight loss, fever or chills Cardiovascular: No chest pain  Respiratory: No SOB Gastrointestinal: See HPI and otherwise negative   Physical Exam:  Vital signs: BP 128/76   Pulse 68   Ht '5\' 11"'$  (1.803 m)   Wt 281 lb 5 oz (127.6 kg)   BMI 39.24 kg/m    Constitutional:   Pleasant AA male appears to be in NAD, Well developed, Well nourished, alert and cooperative Respiratory: Respirations even and unlabored. Lungs clear to auscultation bilaterally.   No wheezes, crackles, or rhonchi.  Cardiovascular: Normal S1, S2. No MRG. Regular rate and rhythm. No peripheral edema, cyanosis or pallor.  Gastrointestinal:  Soft, nondistended, nontender. No rebound or guarding. Normal bowel sounds. No appreciable masses or hepatomegaly. Rectal:  Not performed.  Psychiatric: Oriented to person, place and time. Demonstrates good judgement and reason without abnormal affect or behaviors.  RELEVANT LABS AND IMAGING: CBC    Component Value Date/Time   WBC 6.0 12/23/2021 1037   RBC 4.53 12/23/2021 1037   HGB 12.5 (L) 12/23/2021 1037   HCT 37.8 (L) 12/23/2021 1037   PLT 202.0 12/23/2021 1037   MCV 83.3 12/23/2021 1037   MCHC 33.0 12/23/2021 1037   RDW 14.9 12/23/2021 1037   LYMPHSABS 1.0 12/23/2021 1037   MONOABS 0.7 12/23/2021 1037   EOSABS 0.1 12/23/2021 1037   BASOSABS 0.0 12/23/2021 1037    CMP     Component Value Date/Time   NA 141 11/24/2021 1701   K 4.1 11/24/2021 1701   CL 105 11/24/2021 1701   CO2 28 11/24/2021 1701   GLUCOSE 89 11/24/2021 1701   BUN 22 11/24/2021  1701   CREATININE 0.84 11/24/2021 1701   CALCIUM 9.6 11/24/2021 1701   PROT 6.8 11/24/2021 1701   ALBUMIN 3.9 11/24/2021 1701   AST 16 11/24/2021 1701   ALT 14 11/24/2021 1701   ALKPHOS 76 11/24/2021 1701   BILITOT 0.5 11/24/2021 1701   GFRNONAA 146.08 12/11/2009 0755    Assessment: 1.  Iron deficiency anemia: New to the patient over the past month, some improvement in iron levels since starting oral iron, but ferritin remains low and hemoglobin is slowly declining, previous history of gastric bypass which may be contributing need to also consider GI source of bleed 2.  History of gastric bypass  Plan: 1.  Scheduled the patient for a diagnostic EGD given his iron deficiency anemia.  We discussed that he had a recent colonoscopy over the past year so he does not need to repeat this at the moment.  Did provide the patient a detailed list of risks for the procedure and he agrees to proceed.  This is scheduled with Dr. Havery [name] in the [facility]. Patient is appropriate for endoscopic procedure(s) in the ambulatory ([place]) setting.  2.  Patient to continue iron supplementation for now. 3.  We will need to discuss the role of adding a PPI after time of EGD. 4.  Patient to follow in clinic per recommendations from Dr. Havery [name] after time of procedure.  Ellouise Newer, PA-C [facility] 12/30/2021, 2:29 PM  Cc: Biagio Borg, MD

## 2022-01-09 ENCOUNTER — Encounter: Payer: Self-pay | Admitting: Certified Registered Nurse Anesthetist

## 2022-01-17 ENCOUNTER — Encounter: Payer: Self-pay | Admitting: Gastroenterology

## 2022-01-17 ENCOUNTER — Ambulatory Visit (AMBULATORY_SURGERY_CENTER): Payer: Medicare Other | Admitting: Gastroenterology

## 2022-01-17 VITALS — BP 113/62 | HR 66 | Temp 98.4°F | Resp 17 | Ht 71.0 in | Wt 281.0 lb

## 2022-01-17 DIAGNOSIS — D509 Iron deficiency anemia, unspecified: Secondary | ICD-10-CM

## 2022-01-17 DIAGNOSIS — Z9884 Bariatric surgery status: Secondary | ICD-10-CM

## 2022-01-17 DIAGNOSIS — K449 Diaphragmatic hernia without obstruction or gangrene: Secondary | ICD-10-CM | POA: Diagnosis not present

## 2022-01-17 MED ORDER — SODIUM CHLORIDE 0.9 % IV SOLN: 500.0000 mL | Freq: Once | INTRAVENOUS | Status: DC

## 2022-01-17 NOTE — Patient Instructions (Signed)
Impression/Recommendations:  Hiatal hernia handout given to patient.  Monitor Hgb and iron studies on oral iron.  If appropriate response, likely no further workup needed.  Resume previous diet. Continue present medications. Await pathology results.   Trend Hgb/iron studies in a few months after starting oral iron.  YOU HAD AN ENDOSCOPIC PROCEDURE TODAY AT [facility]:   Refer to the procedure report that was given to you for any specific questions about what was found during the examination.  If the procedure report does not answer your questions, please call your gastroenterologist to clarify.  If you requested that your care partner not be given the details of your procedure findings, then the procedure report has been included in a sealed envelope for you to review at your convenience later.  YOU SHOULD EXPECT: Some feelings of bloating in the abdomen. Passage of more gas than usual.  Walking can help get rid of the air that was put into your GI tract during the procedure and reduce the bloating. If you had a lower endoscopy (such as a colonoscopy or flexible sigmoidoscopy) you may notice spotting of blood in your stool or on the toilet paper. If you underwent a bowel prep for your procedure, you may not have a normal bowel movement for a few days.  Please Note:  You might notice some irritation and congestion in your nose or some drainage.  This is from the oxygen used during your procedure.  There is no need for concern and it should clear up in a day or so.  SYMPTOMS TO REPORT IMMEDIATELY:  Following upper endoscopy (EGD)  Vomiting of blood or coffee ground material  New chest pain or pain under the shoulder blades  Painful or persistently difficult swallowing  New shortness of breath  Fever of 100F or higher  Black, tarry-looking stools  For urgent or emergent issues, a gastroenterologist can be reached at any hour by calling [phone]. Do not use MyChart  messaging for urgent concerns.    DIET:  We do recommend a small meal at first, but then you may proceed to your regular diet.  Drink plenty of fluids but you should avoid alcoholic beverages for 24 hours.  ACTIVITY:  You should plan to take it easy for the rest of today and you should NOT DRIVE or use heavy machinery until tomorrow (because of the sedation medicines used during the test).    FOLLOW UP: Our staff will call the number listed on your records the next business day following your procedure.  We will call around 7:15- 8:00 am to check on you and address any questions or concerns that you may have regarding the information given to you following your procedure. If we do not reach you, we will leave a message.     If any biopsies were taken you will be contacted by phone or by letter within the next 1-3 weeks.  Please call us at [phone] if you have not heard about the biopsies in 3 weeks.    SIGNATURES/CONFIDENTIALITY: You and/or your care partner have signed paperwork which will be entered into your electronic medical record.  These signatures attest to the fact that that the information above on your After Visit Summary has been reviewed and is understood.  Full responsibility of the confidentiality of this discharge information lies with you and/or your care-partner.

## 2022-01-17 NOTE — Progress Notes (Signed)
Report given to PACU, vss 

## 2022-01-17 NOTE — Progress Notes (Signed)
VS completed by DT.  Pt's states no medical or surgical changes since previsit or office visit.  

## 2022-01-17 NOTE — Progress Notes (Signed)
Called to room to assist during endoscopic procedure.  Patient ID and intended procedure confirmed with present staff. Received instructions for my participation in the procedure from the performing physician.  

## 2022-01-17 NOTE — Progress Notes (Signed)
History and Physical Interval Note: Patient here for EGD to further evaluate iron deficiency anemia. History of colonoscopy in 2022 without high risk lesions. History of roux-en-Y gastric bypass. EGD to further evaluate, patient is on oral iron supplementation at this time. Have discussed risks he wishes to proceed. Seen on 12/30/21 without interval changes otherwise. He denies complaints this AM otherwise.    01/17/2022 10:41 AM  Norton Pastel Janifer Adie Sr.  has presented today for endoscopic procedure(s), with the diagnosis of  Encounter Diagnoses  Name Primary?   Iron deficiency anemia, unspecified iron deficiency anemia type Yes   History of Roux-en-Y gastric bypass   .  The various methods of evaluation and treatment have been discussed with the patient and/or family. After consideration of risks, benefits and other options for treatment, the patient has consented to  the endoscopic procedure(s).   The patient's history has been reviewed, patient examined, no change in status, stable for surgery.  I have reviewed the patient's chart and labs.  Questions were answered to the patient's satisfaction.    Jolly Mango, MD [facility]

## 2022-01-17 NOTE — Progress Notes (Signed)
1039 Robinul 0.1 mg IV given due large amount of secretions upon assessment.  MD made aware, vss

## 2022-01-17 NOTE — Op Note (Signed)
[facility] Patient Name: Joshua Burnett Procedure Date: 01/17/2022 10:35 AM MRN: [id number] Endoscopist: Remo Lipps P. Havery [name] , MD, [id number] Age: 71 Referring MD:  Date of Birth: 11-18-1950 Gender: Male Account #: [id number] Procedure:                Upper GI endoscopy Indications:              Iron deficiency anemia - in setting of gastric                            bypass. Colonoscopy last year without any high risk                            / concerning lesions. Patient had stopped iron /                            vitamin supplementation, has since resumed. No                            significant NSAID use Medicines:                Monitored Anesthesia Care Procedure:                Pre-Anesthesia Assessment:                           - Prior to the procedure, a History and Physical                            was performed, and patient medications and                            allergies were reviewed. The patient's tolerance of                            previous anesthesia was also reviewed. The risks                            and benefits of the procedure and the sedation                            options and risks were discussed with the patient.                            All questions were answered, and informed consent                            was obtained. Prior Anticoagulants: The patient has                            taken no anticoagulant or antiplatelet agents. ASA                            Grade Assessment: II - A patient with mild systemic  disease. After reviewing the risks and benefits,                            the patient was deemed in satisfactory condition to                            undergo the procedure.                           After obtaining informed consent, the endoscope was                            passed under direct vision. Throughout the                            procedure, the patient's blood  pressure, pulse, and                            oxygen saturations were monitored continuously. The                            GIF [id number] #[id number] was introduced through the                            mouth, and advanced to the proximal jejunum. The                            upper GI endoscopy was accomplished without                            difficulty. The patient tolerated the procedure                            well. Scope In: Scope Out: Findings:                 Esophagogastric landmarks were identified: the                            Z-line was found at 37 cm, the gastroesophageal                            junction was found at 37 cm and the upper extent of                            the gastric folds was found at 39 cm from the                            incisors.                           A 2 cm hiatal hernia was present.                           The examined esophagus was tortuous.  The exam of the esophagus was otherwise normal.                           Evidence of a Roux-en-Y gastrojejunostomy was                            found. The gastrojejunal anastomosis was                            characterized by healthy appearing mucosa. No                            ulcerations.                           The exam of the gastric pouch was otherwise normal.                            Retroflexed views obtained but difficult to see                            cardia given anatomy.                           Biopsies were taken with a cold forceps for                            Helicobacter pylori testing.                           The examined small bowel limbs were normal (blind                            pouch and distal limb, which was deeply intubated). Complications:            No immediate complications. Estimated blood loss:                            Minimal. Estimated Blood Loss:     Estimated blood loss was minimal. Impression:               -  Esophagogastric landmarks identified.                           - 2 cm hiatal hernia.                           - Tortuous esophagus.                           - Normal esophagus othersise                           - Roux-en-Y gastrojejunostomy with gastrojejunal                            anastomosis characterized by healthy appearing  mucosa.                           - Healthy appearing gastric pouch - biopsies taken                            to rule out H pylori                           - Normal examined small bowel limb                           No overt cause for iron deficiency, which could be                            due to roux-en-Y gastric bypass state without                            having baseline iron supplementation. Would monitor                            Hgb and iron studies on oral iron and if he                            responds appropriately, likely no further                            endoscopic workup is needed. Recommendation:           - Patient has a contact number available for                            emergencies. The signs and symptoms of potential                            delayed complications were discussed with the                            patient. Return to normal activities tomorrow.                            Written discharge instructions were provided to the                            patient.                           - Resume previous diet.                           - Continue present medications.                           - Await pathology results.                           - Trend  Hgb / iron studies in a few months after                            starting oral iron Remo Lipps P. Jahdiel Krol, MD 01/17/2022 10:58:30 AM This report has been signed electronically.

## 2022-01-18 ENCOUNTER — Telehealth: Payer: Self-pay

## 2022-01-18 DIAGNOSIS — D509 Iron deficiency anemia, unspecified: Secondary | ICD-10-CM

## 2022-01-18 DIAGNOSIS — Z9884 Bariatric surgery status: Secondary | ICD-10-CM

## 2022-01-18 NOTE — Telephone Encounter (Signed)
Per 01/17/22 procedure report - Trend Hgb/iron studies in a few months after starting oral iron  Lab orders and reminder in epic.

## 2022-01-18 NOTE — Telephone Encounter (Signed)
Left message on follow up call. 

## 2022-01-21 ENCOUNTER — Other Ambulatory Visit: Payer: Self-pay | Admitting: Gastroenterology

## 2022-01-21 DIAGNOSIS — D509 Iron deficiency anemia, unspecified: Secondary | ICD-10-CM

## 2022-01-25 NOTE — Progress Notes (Unsigned)
Subjective:   Joshua Ishihara Sr. is a 71 y.o. male who presents for an Initial Medicare Annual Wellness Visit. I connected with  Joshua Ishihara Sr. on 01/26/22 by a audio enabled telemedicine application and verified that I am speaking with the correct person using two identifiers.  Patient Location: Home  Provider Location: Home Office  I discussed the limitations of evaluation and management by telemedicine. The patient expressed understanding and agreed to proceed.  Review of Systems    Deferred to PCP Cardiac Risk Factors include: advanced age (>82mn, >>59women);dyslipidemia;hypertension;male gender     Objective:    Today's Vitals   01/26/22 1545  PainSc: 2    There is no height or weight on file to calculate BMI.     01/26/2022    3:53 PM 09/28/2016    3:51 PM  Advanced Directives  Does Patient Have a Medical Advance Directive? Yes Yes  Type of [place]of [place] will [place] will  Does patient want to make changes to medical advance directive? No - Patient declined   Copy of [place] Chart? No - copy requested     Current Medications (verified) Outpatient Encounter Medications as of 01/26/2022  Medication Sig   acetaminophen (TYLENOL 8 HOUR) 650 MG CR tablet 4 tablets twice a day by oral route.   acetaminophen (TYLENOL) 500 MG tablet Take 500 mg by mouth every 6 (six) hours as needed.   amLODipine (NORVASC) 5 MG tablet TAKE 1 TABLET BY MOUTH EVERY DAY   atorvastatin (LIPITOR) 40 MG tablet Take 1 tablet by mouth daily.   Cholecalciferol (VITAMIN D3 PO) Take 5,000 Int'l Units by mouth.   FISH OIL-KRILL OIL PO 500 mg.   furosemide (LASIX) 20 MG tablet 1 tab by mouth in the AM, and 1 in the PM as needed for persistent swelling   irbesartan (AVAPRO) 150 MG tablet Take 1 tablet (150 mg total) by mouth daily.   iron polysaccharides (NU-IRON) 150 MG capsule Take 1 capsule  (150 mg total) by mouth daily.   loratadine (CLARITIN) 10 MG tablet Take 10 mg by mouth daily.   naproxen sodium (ALEVE) 220 MG tablet Take 220 mg by mouth.   Pediatric Multivit-Minerals (FLINTSTONES COMPLETE PO) Take by mouth.   Probiotic Product (PROBIOTIC DAILY PO) Take by mouth. [facility] colon health   tamsulosin (FLOMAX) 0.4 MG CAPS capsule Take 0.8 mg by mouth daily.   tiZANidine (ZANAFLEX) 2 MG tablet TAKE 1 TABLET BY MOUTH EVERYDAY AT BEDTIME   traMADol (ULTRAM) 50 MG tablet TAKE 1 TABLET BY MOUTH EVERY 6 HOURS AS NEEDED FOR MODERATE PAIN   Turmeric 1053 MG TABS turmeric   Bacillus Coagulans-Inulin (PROBIOTIC) 1-250 BILLION-MG CAPS Probiotic (Patient not taking: Reported on 01/26/2022)   Cholecalciferol 250 MCG (10000 UT) TABS cholecalciferol (vitamin D3) 250 mcg (10,000 unit) tablet   1 tablet every day by oral route. (Patient not taking: Reported on 01/26/2022)   desloratadine (CLARINEX) 5 MG tablet TAKE 1 TABLET BY MOUTH EVERY DAY (Patient not taking: Reported on 10/25/2021)   Diclofenac Sodium (PENNSAID) 2 % SOLN Place 2 application onto the skin 2 (two) times daily. (Patient not taking: Reported on 01/26/2022)   meloxicam (MOBIC) 15 MG tablet Take 1 tablet (15 mg total) by mouth daily. (Patient not taking: Reported on 10/25/2021)   Omega-3 Krill Oil 500 MG CAPS Take 1 capsule by mouth daily. (Patient not taking: Reported on 01/26/2022)   Semaglutide-Weight  Management (WEGOVY) 0.5 MG/0.5ML SOAJ Inject 0.5 mg into the skin once a week. (Patient not taking: Reported on 10/25/2021)   Sodium Sulfate-Mag Sulfate-KCl (SUTAB) [phone] MG TABS Take 24 tablets by mouth as directed. 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 BIN: [id number] PCN: CN GROUP: [id number] MEMBER ID: [id number];[employer] AS SECONDARY INSURANCE ;NO PRIOR AUTHORIZATION (Patient not taking: Reported on 01/26/2022)   tadalafil (CIALIS) 20 MG tablet TAKE 1 TABLET BY MOUTH EVERY DAY AS NEEDED (Patient not taking: Reported on 10/25/2021)   Turmeric 500 MG  CAPS Take 1,400 mg by mouth 2 (two) times daily. (Patient not taking: Reported on 01/26/2022)   Facility-Administered Encounter Medications as of 01/26/2022  Medication   0.9 %  sodium chloride infusion    Allergies (verified) Ace inhibitors   History: Past Medical History:  Diagnosis Date   Acute pharyngitis 01/26/2009   ALLERGIC RHINITIS 08/07/2007   Allergy    ARTHRITIS 08/07/2007   BACK PAIN 09/15/2009   BENIGN PROSTATIC HYPERTROPHY 05/20/2008   Cataract    removed bilaterally    EATING DISORDER, HX OF 08/07/2007   ERECTILE DYSFUNCTION 02/18/2008   GERD (gastroesophageal reflux disease)    HYPERLIPIDEMIA 08/07/2007   HYPERTENSION 08/07/2007   NECK PAIN, RIGHT 09/15/2009   Neuromuscular disorder ([place])    carpal tunnel    OSTEOARTHRITIS, KNEES, BILATERAL 09/15/2009   SINUSITIS- ACUTE-NOS 04/30/2010   Sleep apnea    weas cpap    Past Surgical History:  Procedure Laterality Date   COLONOSCOPY     GASTRIC BYPASS  2006   Left foot surgery  2008   Lower back  2004   POLYPECTOMY     Right knee arthroscopy  1994   UPPER GASTROINTESTINAL ENDOSCOPY     Family History  Problem Relation Age of Onset   Hyperlipidemia Father    Heart disease Father    Hypertension Father    Colon polyps Neg Hx    Esophageal cancer Neg Hx    Rectal cancer Neg Hx    Stomach cancer Neg Hx    Pancreatic cancer Neg Hx    Colon cancer Neg Hx    Social History   Socioeconomic History   Marital status: Married    Spouse name: Neoma Laming   Number of children: 2   Years of education: college   Highest education level: Not on file  Occupational History   Not on file  Tobacco Use   Smoking status: Never   Smokeless tobacco: Never  Vaping Use   Vaping Use: Never used  Substance and Sexual Activity   Alcohol use: Yes    Comment: occasional beer    Drug use: No   Sexual activity: Not Currently  Other Topics Concern   Not on file  Social History Narrative   Not on file   Social Determinants of  Health   Financial Resource Strain: Low Risk  (01/26/2022)   Overall Financial Resource Strain (CARDIA)    Difficulty of Paying Living Expenses: Not hard at all  Food Insecurity: No Food Insecurity (01/26/2022)   Hunger Vital Sign    Worried About Running Out of Food in the Last Year: Never true    Ran Out of Food in the Last Year: Never true  Transportation Needs: No Transportation Needs (01/26/2022)   PRAPARE - Hydrologist (Medical): No    Lack of Transportation (Non-Medical): No  Physical Activity: Sufficiently Active (01/26/2022)   Exercise Vital Sign    Days of Exercise per Week: 5 days  Minutes of Exercise per Session: 40 min  Stress: No Stress Concern Present (01/26/2022)   [place]    Feeling of Stress : Not at all  Social Connections: [place] (01/26/2022)   Social Connection and Isolation Panel [NHANES]    Frequency of Communication with Friends and Family: More than three times a week    Frequency of Social Gatherings with Friends and Family: More than three times a week    Attends Religious Services: More than 4 times per year    Active Member of [employer] or Organizations: Yes    Attends Music therapist: More than 4 times per year    Marital Status: Married    Tobacco Counseling Counseling given: Not Answered   Clinical Intake:  Pre-visit preparation completed: Yes  Pain : 0-10 Pain Score: 2  Pain Type: Chronic pain Pain Location: Ankle Pain Relieving Factors: medication  Pain Relieving Factors: medication  Nutritional Status: BMI > 30  Obese Nutritional Risks: None Diabetes: No  How often do you need to have someone help you when you read instructions, pamphlets, or other written materials from your doctor or pharmacy?: 1 - Never What is the last grade level you completed in school?: college  Diabetic?No  Interpreter Needed?:  No  Information entered by :: Emelia Loron RN   Activities of Daily Living    01/26/2022    3:52 PM  In your present state of health, do you have any difficulty performing the following activities:  Hearing? 0  Vision? 0  Difficulty concentrating or making decisions? 0  Walking or climbing stairs? 0  Dressing or bathing? 0  Doing errands, shopping? 0  Preparing Food and eating ? N  Using the Toilet? N  In the past six months, have you accidently leaked urine? N  Do you have problems with loss of bowel control? N  Managing your Medications? N  Managing your Finances? N  Housekeeping or managing your Housekeeping? N    Patient Care Team: Biagio Borg, MD as PCP - General  Indicate any recent Medical Services you may have received from other than [facility] providers in the past year (date may be approximate).     Assessment:   This is a routine wellness examination for [employer].  Hearing/Vision screen No results found.  Dietary issues and exercise activities discussed: Current Exercise Habits: Home exercise routine, Type of exercise: walking, Time (Minutes): 50, Frequency (Times/Week): 6, Weekly Exercise (Minutes/Week): 300, Intensity: Mild   Goals Addressed             This Visit's Progress    Patient Stated       I want to decrease the amount of bread and sweets I eat.      Depression Screen    01/26/2022    3:49 PM 12/23/2021    9:38 AM 11/24/2021    3:52 PM 06/23/2021    9:05 AM 06/23/2021    8:51 AM 12/23/2020   11:20 AM 07/25/2019    1:52 PM  PHQ 2/9 Scores  PHQ - 2 Score 0 0 0 0 0 0 0  PHQ- 9 Score  0 2        Fall Risk    01/26/2022    3:55 PM 12/23/2021    9:39 AM 11/24/2021    3:52 PM 06/23/2021    9:05 AM 06/23/2021    8:51 AM  [place] in the past year?  0 0 0 0 0  Number falls in past yr: 0   0 0  Injury with Fall? 0   0 0  Risk for fall due to : No Fall Risks No Fall Risks     Follow up Falls evaluation completed Falls evaluation  completed       [place]:  Any stairs in or around the home? Yes  If so, are there any without handrails? Yes  Home free of loose throw rugs in walkways, pet beds, electrical cords, etc? Yes  Adequate lighting in your home to reduce risk of falls? Yes   ASSISTIVE DEVICES UTILIZED TO PREVENT FALLS:  Life alert? No  Use of a cane, walker or w/c? No  Grab bars in the bathroom? No  Shower chair or bench in shower? No  Elevated toilet seat or a handicapped toilet? No   Cognitive Function:        01/26/2022    3:54 PM  6CIT Screen  What Year? 0 points  What month? 0 points  What time? 0 points  Count back from 20 0 points  Months in reverse 0 points  Repeat phrase 0 points  Total Score 0 points    Immunizations Immunization History  Administered Date(s) Administered   Fluad Quad(high Dose 65+) 11/15/2018, 12/30/2019   Influenza Split 12/21/2010, 12/22/2011   Influenza Whole 01/02/2009, 12/18/2009   Influenza,inj,Quad PF,6+ Mos 12/28/2012, 01/09/2015, 01/26/2016   Influenza-Unspecified 11/12/2016, 12/13/2017, 12/12/2020   PFIZER Comirnaty(Gray Top)Covid-19 Tri-Sucrose Vaccine 06/13/2020   PFIZER(Purple Top)SARS-COV-2 Vaccination 04/12/2019, 05/07/2019, 12/05/2019   Pfizer Covid-19 Vaccine Bivalent Booster 67yr & up 12/22/2020   Pneumococcal Conjugate-13 08/30/2016   Pneumococcal Polysaccharide-23 10/04/2017   Tdap 12/21/2010   Unspecified SARS-COV-2 Vaccination 12/04/2021   Zoster Recombinat (Shingrix) 04/25/2017, 09/24/2017, 01/11/2019   Zoster, Live 12/28/2012    Flu Vaccine status: Up to date  Pneumococcal vaccine status: Up to date  Covid-19 vaccine status: Information provided on how to obtain vaccines.   Qualifies for Shingles Vaccine? Yes   Zostavax completed No   Shingrix Completed?: Yes  Screening Tests Health Maintenance  Topic Date Due   COVID-19 Vaccine (7 - 2023-24 season) 01/29/2022   Medicare Annual  Wellness (AWV)  01/27/2023   COLONOSCOPY (Pts [phone] coverage will need to be confirmed)  07/01/2023   Pneumonia Vaccine 6549Years old  Completed   INFLUENZA VACCINE  Completed   Hepatitis C Screening  Completed   Zoster Vaccines- Shingrix  Completed   HPV VACCINES  Aged Out    Health Maintenance  Health Maintenance Due  Topic Date Due   COVID-19 Vaccine (7 - 2023-24 season) 01/29/2022    Colorectal cancer screening: Type of screening: Colonoscopy. Completed 06/30/20. Repeat every 3 years  Lung Cancer Screening: (Low Dose CT Chest recommended if Age 71-80ears, 30 pack-year currently smoking OR have quit w/in 15years.) does not qualify.   Additional Screening:  Hepatitis C Screening: does qualify; Completed 01/09/15  Vision Screening: Recommended annual ophthalmology exams for early detection of glaucoma and other disorders of the eye. Is the patient up to date with their annual eye exam?  Yes  Who is the provider or what is the name of the office in which the patient attends annual eye exams[facility]  If pt is not established with a provider, would they like to be referred to a provider to establish care?  N/A .   Dental Screening: Recommended annual dental exams for proper  oral hygiene  Community Resource Referral / Chronic Care Management: CRR required this visit?  No   CCM required this visit?  No      Plan:     I have personally reviewed and noted the following in the patient's chart:   Medical and social history Use of alcohol, tobacco or illicit drugs  Current medications and supplements including opioid prescriptions. Patient is not currently taking opioid prescriptions. Functional ability and status Nutritional status Physical activity Advanced directives List of other physicians Hospitalizations, surgeries, and ER visits in previous 12 months Vitals Screenings to include cognitive, depression, and falls Referrals and  appointments  In addition, I have reviewed and discussed with patient certain preventive protocols, quality metrics, and best practice recommendations. A written personalized care plan for preventive services as well as general preventive health recommendations were provided to patient.     Michiel Cowboy, RN   01/26/2022   Nurse Notes:  Mr. Foutz , Thank you for taking time to come for your Medicare Wellness Visit. I appreciate your ongoing commitment to your health goals. Please review the following plan we discussed and let me know if I can assist you in the future.   These are the goals we discussed:  Goals      Patient Stated     I want to decrease the amount of bread and sweets I eat.        This is a list of the screening recommended for you and due dates:  Health Maintenance  Topic Date Due   COVID-19 Vaccine (7 - 2023-24 season) 01/29/2022   Medicare Annual Wellness Visit  01/27/2023   Colon Cancer Screening  07/01/2023   Pneumonia Vaccine  Completed   Flu Shot  Completed   Hepatitis C Screening: USPSTF Recommendation to screen - Ages 18-79 yo.  Completed   Zoster (Shingles) Vaccine  Completed   HPV Vaccine  Aged Out

## 2022-01-25 NOTE — Patient Instructions (Signed)
Health Maintenance, Male Adopting a healthy lifestyle and getting preventive care are important in promoting health and wellness. Ask your health care provider about: The right schedule for you to have regular tests and exams. Things you can do on your own to prevent diseases and keep yourself healthy. What should I know about diet, weight, and exercise? Eat a healthy diet  Eat a diet that includes plenty of vegetables, fruits, low-fat dairy products, and lean protein. Do not eat a lot of foods that are high in solid fats, added sugars, or sodium. Maintain a healthy weight Body mass index (BMI) is a measurement that can be used to identify possible weight problems. It estimates body fat based on height and weight. Your health care provider can help determine your BMI and help you achieve or maintain a healthy weight. Get regular exercise Get regular exercise. This is one of the most important things you can do for your health. Most adults should: Exercise for at least 150 minutes each week. The exercise should increase your heart rate and make you sweat (moderate-intensity exercise). Do strengthening exercises at least twice a week. This is in addition to the moderate-intensity exercise. Spend less time sitting. Even light physical activity can be beneficial. Watch cholesterol and blood lipids Have your blood tested for lipids and cholesterol at 71 years of age, then have this test every 5 years. You may need to have your cholesterol levels checked more often if: Your lipid or cholesterol levels are high. You are older than 71 years of age. You are at high risk for heart disease. What should I know about cancer screening? Many types of cancers can be detected early and may often be prevented. Depending on your health history and family history, you may need to have cancer screening at various ages. This may include screening for: Colorectal cancer. Prostate cancer. Skin cancer. Lung  cancer. What should I know about heart disease, diabetes, and high blood pressure? Blood pressure and heart disease High blood pressure causes heart disease and increases the risk of stroke. This is more likely to develop in people who have high blood pressure readings or are overweight. Talk with your health care provider about your target blood pressure readings. Have your blood pressure checked: Every 3-5 years if you are 18-39 years of age. Every year if you are 40 years old or older. If you are between the ages of 65 and 75 and are a current or former smoker, ask your health care provider if you should have a one-time screening for abdominal aortic aneurysm (AAA). Diabetes Have regular diabetes screenings. This checks your fasting blood sugar level. Have the screening done: Once every three years after age 45 if you are at a normal weight and have a low risk for diabetes. More often and at a younger age if you are overweight or have a high risk for diabetes. What should I know about preventing infection? Hepatitis B If you have a higher risk for hepatitis B, you should be screened for this virus. Talk with your health care provider to find out if you are at risk for hepatitis B infection. Hepatitis C Blood testing is recommended for: Everyone born from 1945 through 1965. Anyone with known risk factors for hepatitis C. Sexually transmitted infections (STIs) You should be screened each year for STIs, including gonorrhea and chlamydia, if: You are sexually active and are younger than 71 years of age. You are older than 71 years of age and your   health care provider tells you that you are at risk for this type of infection. Your sexual activity has changed since you were last screened, and you are at increased risk for chlamydia or gonorrhea. Ask your health care provider if you are at risk. Ask your health care provider about whether you are at high risk for HIV. Your health care provider  may recommend a prescription medicine to help prevent HIV infection. If you choose to take medicine to prevent HIV, you should first get tested for HIV. You should then be tested every 3 months for as long as you are taking the medicine. Follow these instructions at home: Alcohol use Do not drink alcohol if your health care provider tells you not to drink. If you drink alcohol: Limit how much you have to 0-2 drinks a day. Know how much alcohol is in your drink. In the [place] one drink equals one 12 oz bottle of beer (355 mL), one 5 oz glass of Cortavius Montesinos (148 mL), or one 1 oz glass of hard liquor (44 mL). Lifestyle Do not use any products that contain nicotine or tobacco. These products include cigarettes, chewing tobacco, and vaping devices, such as e-cigarettes. If you need help quitting, ask your health care provider. Do not use street drugs. Do not share needles. Ask your health care provider for help if you need support or information about quitting drugs. General instructions Schedule regular health, dental, and eye exams. Stay current with your vaccines. Tell your health care provider if: You often feel depressed. You have ever been abused or do not feel safe at home. Summary Adopting a healthy lifestyle and getting preventive care are important in promoting health and wellness. Follow your health care provider's instructions about healthy diet, exercising, and getting tested or screened for diseases. Follow your health care provider's instructions on monitoring your cholesterol and blood pressure. This information is not intended to replace advice given to you by your health care provider. Make sure you discuss any questions you have with your health care provider. Document Revised: 07/13/2020 Document Reviewed: 07/13/2020 Elsevier Patient Education  2023 [employer]  

## 2022-01-26 ENCOUNTER — Ambulatory Visit (INDEPENDENT_AMBULATORY_CARE_PROVIDER_SITE_OTHER): Payer: Medicare Other | Admitting: *Deleted

## 2022-01-26 DIAGNOSIS — Z0000 Encounter for general adult medical examination without abnormal findings: Secondary | ICD-10-CM | POA: Diagnosis not present

## 2022-02-10 ENCOUNTER — Other Ambulatory Visit: Payer: Self-pay

## 2022-02-10 MED ORDER — ATORVASTATIN CALCIUM 40 MG PO TABS: 40.0000 mg | ORAL_TABLET | Freq: Every day | ORAL | 0 refills | Status: DC

## 2022-02-12 ENCOUNTER — Other Ambulatory Visit: Payer: Self-pay | Admitting: Internal Medicine

## 2022-02-14 DIAGNOSIS — M19079 Primary osteoarthritis, unspecified ankle and foot: Secondary | ICD-10-CM | POA: Insufficient documentation

## 2022-02-16 ENCOUNTER — Telehealth: Payer: Self-pay

## 2022-02-16 DIAGNOSIS — D509 Iron deficiency anemia, unspecified: Secondary | ICD-10-CM

## 2022-02-16 NOTE — Telephone Encounter (Signed)
Orders placed for labs after Christmas. Letter mailed to patient and MyChart message sent

## 2022-02-16 NOTE — Telephone Encounter (Signed)
-----   Message from Patti E [place], [place] sent at 01/21/2022 12:53 PM EST -----  Please contact patient, due for CBC and a TIBC/ferritin around December 29th. Orders are in.

## 2022-03-01 ENCOUNTER — Other Ambulatory Visit (INDEPENDENT_AMBULATORY_CARE_PROVIDER_SITE_OTHER): Payer: Medicare Other

## 2022-03-01 DIAGNOSIS — D509 Iron deficiency anemia, unspecified: Secondary | ICD-10-CM | POA: Diagnosis not present

## 2022-03-01 LAB — IBC + FERRITIN
Ferritin: 9.2 ng/mL — ABNORMAL LOW (ref 22.0–322.0)
Iron: 44 ug/dL (ref 42–165)
Saturation Ratios: 12 % — ABNORMAL LOW (ref 20.0–50.0)
TIBC: 366.8 ug/dL (ref 250.0–450.0)
Transferrin: 262 mg/dL (ref 212.0–360.0)

## 2022-03-01 LAB — CBC WITH DIFFERENTIAL/PLATELET
Basophils Absolute: 0 10*3/uL (ref 0.0–0.1)
Basophils Relative: 0.7 % (ref 0.0–3.0)
Eosinophils Absolute: 0.2 10*3/uL (ref 0.0–0.7)
Eosinophils Relative: 2.6 % (ref 0.0–5.0)
HCT: 42.3 % (ref 39.0–52.0)
Hemoglobin: 13.7 g/dL (ref 13.0–17.0)
Lymphocytes Relative: 17.2 % (ref 12.0–46.0)
Lymphs Abs: 1.1 10*3/uL (ref 0.7–4.0)
MCHC: 32.4 g/dL (ref 30.0–36.0)
MCV: 85.8 fl (ref 78.0–100.0)
Monocytes Absolute: 0.6 10*3/uL (ref 0.1–1.0)
Monocytes Relative: 9.1 % (ref 3.0–12.0)
Neutro Abs: 4.4 10*3/uL (ref 1.4–7.7)
Neutrophils Relative %: 70.4 % (ref 43.0–77.0)
Platelets: 204 10*3/uL (ref 150.0–400.0)
RBC: 4.93 Mil/uL (ref 4.22–5.81)
RDW: 16.2 % — ABNORMAL HIGH (ref 11.5–15.5)
WBC: 6.3 10*3/uL (ref 4.0–10.5)

## 2022-03-02 ENCOUNTER — Other Ambulatory Visit: Payer: Self-pay

## 2022-03-02 DIAGNOSIS — Z9884 Bariatric surgery status: Secondary | ICD-10-CM

## 2022-03-02 DIAGNOSIS — D509 Iron deficiency anemia, unspecified: Secondary | ICD-10-CM

## 2022-03-09 ENCOUNTER — Other Ambulatory Visit: Payer: Self-pay

## 2022-03-09 MED ORDER — AMLODIPINE BESYLATE 5 MG PO TABS: 5.0000 mg | ORAL_TABLET | Freq: Every day | ORAL | 3 refills | Status: DC

## 2022-04-18 DIAGNOSIS — M19279 Secondary osteoarthritis, unspecified ankle and foot: Secondary | ICD-10-CM | POA: Insufficient documentation

## 2022-04-18 DIAGNOSIS — M19271 Secondary osteoarthritis, right ankle and foot: Secondary | ICD-10-CM | POA: Insufficient documentation

## 2022-04-20 ENCOUNTER — Telehealth: Payer: Self-pay

## 2022-04-20 NOTE — Telephone Encounter (Signed)
-----   Message from Yevette Edwards, RN sent at 01/18/2022  9:45 AM EST ----- Regarding: Labs CBC and IBC + Ferritin - orders are in epic

## 2022-04-20 NOTE — Telephone Encounter (Signed)
MyChart message sent to patient with lab reminder.  

## 2022-04-21 NOTE — Telephone Encounter (Signed)
Patient reviewed MyChart message. Last read by Lorenda Ishihara Sr. at 12:55 PM on 04/20/2022.

## 2022-04-28 ENCOUNTER — Ambulatory Visit (INDEPENDENT_AMBULATORY_CARE_PROVIDER_SITE_OTHER): Payer: Medicare Other | Admitting: Internal Medicine

## 2022-04-28 VITALS — BP 158/90 | HR 79 | Temp 98.0°F | Ht 71.0 in | Wt 283.0 lb

## 2022-04-28 DIAGNOSIS — E538 Deficiency of other specified B group vitamins: Secondary | ICD-10-CM

## 2022-04-28 DIAGNOSIS — E7800 Pure hypercholesterolemia, unspecified: Secondary | ICD-10-CM | POA: Diagnosis not present

## 2022-04-28 DIAGNOSIS — K2970 Gastritis, unspecified, without bleeding: Secondary | ICD-10-CM | POA: Diagnosis not present

## 2022-04-28 DIAGNOSIS — I10 Essential (primary) hypertension: Secondary | ICD-10-CM

## 2022-04-28 DIAGNOSIS — E559 Vitamin D deficiency, unspecified: Secondary | ICD-10-CM

## 2022-04-28 DIAGNOSIS — R739 Hyperglycemia, unspecified: Secondary | ICD-10-CM

## 2022-04-28 MED ORDER — OMEPRAZOLE 40 MG PO CPDR: 40.0000 mg | DELAYED_RELEASE_CAPSULE | Freq: Every day | ORAL | 3 refills | Status: DC

## 2022-04-28 NOTE — Patient Instructions (Addendum)
Please take all new medication as prescribed - the omprazole 40 mg per day  Please continue all other medications as before, including the iron for now  Please remember to have your blood tests for iron done in late March as per Gastroenterology  Please have the pharmacy call with any other refills you may need.  Please continue your efforts at being more active, low cholesterol diet, and weight control.  Please keep your appointments with your specialists as you may have planned  Please make an Appointment to return in Apr 23, or sooner if needed

## 2022-04-28 NOTE — Progress Notes (Signed)
Patient ID: Joshua Ishihara Sr., male   DOB: 11/12/1950, 72 y.o.   MRN: [id number]        Chief Complaint: follow up HTN, iron deficiency, gastritis, hld, low vit d       HPI:  Joshua Eplin Sr. is a 71 y.o. male here with c/o epigastric pain mild gnawing but persistent for several wks, with a heaviness, belching, and sometimes feeling of need for BM.  Prilosec otc seems to help but label states to not use more than 2 wks.  BP has been normal controlled at home.  No recent overt bleeding.  Trying to follow lower chol diet.  Pt denies chest pain, increased sob or doe, wheezing, orthopnea, PND, increased LE swelling, palpitations, dizziness or syncope.   Pt denies polydipsia, polyuria, or new focal neuro s/s.          Wt Readings from Last 3 Encounters:  04/28/22 283 lb (128.4 kg)  01/17/22 281 lb (127.5 kg)  12/30/21 281 lb 5 oz (127.6 kg)   BP Readings from Last 3 Encounters:  04/28/22 (!) 158/90  01/17/22 113/62  12/30/21 128/76         Past Medical History:  Diagnosis Date   Acute pharyngitis 01/26/2009   ALLERGIC RHINITIS 08/07/2007   Allergy    ARTHRITIS 08/07/2007   BACK PAIN 09/15/2009   BENIGN PROSTATIC HYPERTROPHY 05/20/2008   Cataract    removed bilaterally    EATING DISORDER, HX OF 08/07/2007   ERECTILE DYSFUNCTION 02/18/2008   GERD (gastroesophageal reflux disease)    HYPERLIPIDEMIA 08/07/2007   HYPERTENSION 08/07/2007   NECK PAIN, RIGHT 09/15/2009   Neuromuscular disorder ([place])    carpal tunnel    OSTEOARTHRITIS, KNEES, BILATERAL 09/15/2009   SINUSITIS- ACUTE-NOS 04/30/2010   Sleep apnea    weas cpap    Past Surgical History:  Procedure Laterality Date   COLONOSCOPY     GASTRIC BYPASS  2006   Left foot surgery  2008   Lower back  2004   POLYPECTOMY     Right knee arthroscopy  1994   UPPER GASTROINTESTINAL ENDOSCOPY      reports that he has never smoked. He has never used smokeless tobacco. He reports current alcohol use. He reports that he does not use  drugs. family history includes Heart disease in his father; Hyperlipidemia in his father; Hypertension in his father. Allergies  Allergen Reactions   Ace Inhibitors     REACTION: tongue swelling   Current Outpatient Medications on File Prior to Visit  Medication Sig Dispense Refill   acetaminophen (TYLENOL 8 HOUR) 650 MG CR tablet 4 tablets twice a day by oral route.     acetaminophen (TYLENOL) 500 MG tablet Take 500 mg by mouth every 6 (six) hours as needed.     amLODipine (NORVASC) 5 MG tablet Take 1 tablet (5 mg total) by mouth daily. 90 tablet 3   atorvastatin (LIPITOR) 40 MG tablet Take 1 tablet (40 mg total) by mouth daily. 30 tablet 0   Cholecalciferol (VITAMIN D3 PO) Take 5,000 Int'l Units by mouth.     FISH OIL-KRILL OIL PO 500 mg.     furosemide (LASIX) 20 MG tablet 1 tab by mouth in the AM, and 1 in the PM as needed for persistent swelling 180 tablet 3   irbesartan (AVAPRO) 150 MG tablet Take 1 tablet (150 mg total) by mouth daily. 90 tablet 3   iron polysaccharides (NU-IRON) 150 MG capsule Take 1 capsule (150 mg  total) by mouth daily. 90 capsule 1   loratadine (CLARITIN) 10 MG tablet Take 10 mg by mouth daily.     Pediatric Multivit-Minerals (FLINTSTONES COMPLETE PO) Take by mouth.     Probiotic Product (PROBIOTIC DAILY PO) Take by mouth. [facility]     tamsulosin (FLOMAX) 0.4 MG CAPS capsule Take 0.8 mg by mouth daily.     tiZANidine (ZANAFLEX) 2 MG tablet TAKE 1 TABLET BY MOUTH EVERYDAY AT BEDTIME 90 tablet 1   traMADol (ULTRAM) 50 MG tablet TAKE 1 TABLET BY MOUTH EVERY 6 HOURS AS NEEDED FOR MODERATE PAIN 120 tablet 5   Turmeric 1053 MG TABS turmeric     Bacillus Coagulans-Inulin (PROBIOTIC) 1-250 BILLION-MG CAPS Probiotic (Patient not taking: Reported on 01/26/2022)     Cholecalciferol 250 MCG (10000 UT) TABS cholecalciferol (vitamin D3) 250 mcg (10,000 unit) tablet   1 tablet every day by oral route. (Patient not taking: Reported on 01/26/2022)     desloratadine  (CLARINEX) 5 MG tablet TAKE 1 TABLET BY MOUTH EVERY DAY (Patient not taking: Reported on 10/25/2021) 90 tablet 3   Diclofenac Sodium (PENNSAID) 2 % SOLN Place 2 application onto the skin 2 (two) times daily. (Patient not taking: Reported on 01/26/2022) 112 g 3   tadalafil (CIALIS) 20 MG tablet TAKE 1 TABLET BY MOUTH EVERY DAY AS NEEDED (Patient not taking: Reported on 10/25/2021) 6 tablet 9   Current Facility-Administered Medications on File Prior to Visit  Medication Dose Route Frequency Provider Last Rate Last Admin   0.9 %  sodium chloride infusion  500 mL Intravenous Once Armbruster, Carlota Raspberry, MD            ROS:  All others reviewed and negative.  Objective        PE:  BP (!) 158/90 (BP Location: Left Arm, Patient Position: Sitting, Cuff Size: Large)   Pulse 79   Temp 98 F (36.7 C) (Oral)   Ht '5\' 11"'$  (1.803 m)   Wt 283 lb (128.4 kg)   SpO2 94%   BMI 39.47 kg/m                 Constitutional: Pt appears in NAD               HENT: Head: NCAT.                Right Ear: External ear normal.                 Left Ear: External ear normal.                Eyes: . Pupils are equal, round, and reactive to light. Conjunctivae and EOM are normal               Nose: without d/c or deformity               Neck: Neck supple. Gross normal ROM               Cardiovascular: Normal rate and regular rhythm.                 Pulmonary/Chest: Effort normal and breath sounds without rales or wheezing.                Abd:  Soft, mild epigastric tender,, ND, + BS, no organomegaly               Neurological: Pt is alert. At baseline orientation, motor grossly intact  Skin: Skin is warm. No rashes, no other new lesions, LE edema - none               Psychiatric: Pt behavior is normal without agitation   Micro: none  Cardiac tracings I have personally interpreted today:  none  Pertinent Radiological findings (summarize): none   Lab Results  Component Value Date   WBC 6.3 03/01/2022    HGB 13.7 03/01/2022   HCT 42.3 03/01/2022   PLT 204.0 03/01/2022   GLUCOSE 89 11/24/2021   CHOL 139 11/24/2021   TRIG 78.0 11/24/2021   HDL 53.80 11/24/2021   LDLDIRECT 157.6 01/02/2009   LDLCALC 70 11/24/2021   ALT 14 11/24/2021   AST 16 11/24/2021   NA 141 11/24/2021   K 4.1 11/24/2021   CL 105 11/24/2021   CREATININE 0.84 11/24/2021   BUN 22 11/24/2021   CO2 28 11/24/2021   TSH 1.20 06/23/2021   PSA 1.44 06/23/2021   HGBA1C 6.3 11/24/2021   MICROALBUR <0.7 06/23/2021   Assessment/Plan:  Joshua Ishihara Sr. is a 72 y.o. Black or African American [2] male with  has a past medical history of Acute pharyngitis (01/26/2009), ALLERGIC RHINITIS (08/07/2007), Allergy, ARTHRITIS (08/07/2007), BACK PAIN (09/15/2009), BENIGN PROSTATIC HYPERTROPHY (05/20/2008), Cataract, EATING DISORDER, HX OF (08/07/2007), ERECTILE DYSFUNCTION (02/18/2008), GERD (gastroesophageal reflux disease), HYPERLIPIDEMIA (08/07/2007), HYPERTENSION (08/07/2007), NECK PAIN, RIGHT (09/15/2009), Neuromuscular disorder ([place]), OSTEOARTHRITIS, KNEES, BILATERAL (09/15/2009), SINUSITIS- ACUTE-NOS (04/30/2010), and Sleep apnea.  Vitamin D deficiency Last vitamin D Lab Results  Component Value Date   VD25OH 53.04 11/24/2021   Stable, cont oral replacement   B12 deficiency Lab Results  Component Value Date   VITAMINB12 >1500 (H) 11/24/2021   Stable, cont oral replacement - b12 1000 mcg qd   Essential hypertension BP Readings from Last 3 Encounters:  04/28/22 (!) 158/90  01/17/22 113/62  12/30/21 128/76   Uncontrolled here today, pt states controlled at home, declines change today,  pt to continue medical treatment norvasc 5 qd, avapro 150 qd   HLD (hyperlipidemia) Lab Results  Component Value Date   LDLCALC 70 11/24/2021   Uncontrolled, goal ldl < 70, pt to continue current statin liptior 40 mg and lower chol diet, pt declines increase    Hyperglycemia Lab Results  Component Value Date   HGBA1C 6.3 11/24/2021    Stable, pt to continue current medical treatment  - diet, wt control   Gastritis Mild to mod, for PPI omeprazole 40 qd,,  to f/u any worsening symptoms or concerns  Followup: Return in about 2 months (around 06/28/2022).  Cathlean Cower, MD 05/04/2022 4:40 PM [facility]

## 2022-04-28 NOTE — Assessment & Plan Note (Signed)
Last vitamin D Lab Results  Component Value Date   VD25OH 53.04 11/24/2021   Stable, cont oral replacement

## 2022-04-28 NOTE — Assessment & Plan Note (Signed)
Lab Results  Component Value Date   VITAMINB12 >1500 (H) 11/24/2021   Stable, cont oral replacement - b12 1000 mcg qd

## 2022-05-04 ENCOUNTER — Encounter: Payer: Self-pay | Admitting: Internal Medicine

## 2022-05-04 NOTE — Assessment & Plan Note (Signed)
BP Readings from Last 3 Encounters:  04/28/22 (!) 158/90  01/17/22 113/62  12/30/21 128/76   Uncontrolled here today, pt states controlled at home, declines change today,  pt to continue medical treatment norvasc 5 qd, avapro 150 qd

## 2022-05-04 NOTE — Assessment & Plan Note (Signed)
Lab Results  Component Value Date   LDLCALC 70 11/24/2021   Uncontrolled, goal ldl < 70, pt to continue current statin liptior 40 mg and lower chol diet, pt declines increase

## 2022-05-04 NOTE — Assessment & Plan Note (Signed)
Lab Results  Component Value Date   HGBA1C 6.3 11/24/2021   Stable, pt to continue current medical treatment  - diet, wt control

## 2022-05-04 NOTE — Assessment & Plan Note (Signed)
Mild to mod, for PPI omeprazole 40 qd,,  to f/u any worsening symptoms or concerns

## 2022-05-18 ENCOUNTER — Other Ambulatory Visit: Payer: Self-pay | Admitting: Internal Medicine

## 2022-05-26 ENCOUNTER — Ambulatory Visit (INDEPENDENT_AMBULATORY_CARE_PROVIDER_SITE_OTHER): Payer: Medicare Other | Admitting: Pulmonary Disease

## 2022-05-26 ENCOUNTER — Encounter: Payer: Self-pay | Admitting: Pulmonary Disease

## 2022-05-26 VITALS — BP 122/62 | HR 81 | Ht 71.0 in | Wt 287.2 lb

## 2022-05-26 DIAGNOSIS — G4733 Obstructive sleep apnea (adult) (pediatric): Secondary | ICD-10-CM | POA: Diagnosis not present

## 2022-05-26 NOTE — Patient Instructions (Signed)
I will see you in about 6 months  DME referral for CPAP supplies  Call us with significant concerns

## 2022-05-26 NOTE — Progress Notes (Signed)
Rollo Uppal Sr.    [id number]    1950/08/18  Primary Care Physician:[name], [name], MD  Referring Physician: Biagio Borg, MD [address] [place],  [place] [address]  Chief complaint:   History of moderate obstructive sleep apnea  HPI: Continues to use CPAP on a nightly basis Feels continues to benefit from CPAP use  Does have some musculoskeletal pain that limits activities  Weight has been stable  Usually goes to bed between 9 and 10 PM Falls asleep in 5 to 10 minutes Several awakenings  Final wake up time about 4:10 AM Weight is back up by about 40 pounds  He is tired and sleepy during the day  Sometimes does take naps during the day  No family history of obstructive sleep apnea  Outpatient Encounter Medications as of 05/26/2022  Medication Sig   acetaminophen (TYLENOL 8 HOUR) 650 MG CR tablet 4 tablets twice a day by oral route.   amLODipine (NORVASC) 5 MG tablet Take 1 tablet (5 mg total) by mouth daily.   atorvastatin (LIPITOR) 40 MG tablet Take 1 tablet (40 mg total) by mouth daily.   Bacillus Coagulans-Inulin (PROBIOTIC) 1-250 BILLION-MG CAPS    Cholecalciferol (VITAMIN D3 PO) Take 5,000 Int'l Units by mouth.   Cholecalciferol 250 MCG (10000 UT) TABS    desloratadine (CLARINEX) 5 MG tablet TAKE 1 TABLET BY MOUTH EVERY DAY   FISH OIL-KRILL OIL PO 500 mg.   furosemide (LASIX) 20 MG tablet 1 tab by mouth in the AM, and 1 in the PM as needed for persistent swelling   irbesartan (AVAPRO) 150 MG tablet Take 1 tablet (150 mg total) by mouth daily.   iron polysaccharides (NIFEREX) 150 MG capsule TAKE 1 CAPSULE(150 MG) BY MOUTH DAILY   loratadine (CLARITIN) 10 MG tablet Take 10 mg by mouth daily.   omeprazole (PRILOSEC) 40 MG capsule Take 1 capsule (40 mg total) by mouth daily.   Pediatric Multivit-Minerals (FLINTSTONES COMPLETE PO) Take by mouth.   Probiotic Product (PROBIOTIC DAILY PO) Take by mouth. [facility] colon health   tadalafil (CIALIS)  20 MG tablet TAKE 1 TABLET BY MOUTH EVERY DAY AS NEEDED   tamsulosin (FLOMAX) 0.4 MG CAPS capsule Take 0.8 mg by mouth daily.   tiZANidine (ZANAFLEX) 2 MG tablet TAKE 1 TABLET BY MOUTH EVERYDAY AT BEDTIME   traMADol (ULTRAM) 50 MG tablet TAKE 1 TABLET BY MOUTH EVERY 6 HOURS AS NEEDED FOR MODERATE PAIN   Turmeric 1053 MG TABS turmeric   [DISCONTINUED] acetaminophen (TYLENOL) 500 MG tablet Take 500 mg by mouth every 6 (six) hours as needed.   [DISCONTINUED] Diclofenac Sodium (PENNSAID) 2 % SOLN Place 2 application onto the skin 2 (two) times daily. (Patient not taking: Reported on 01/26/2022)   Facility-Administered Encounter Medications as of 05/26/2022  Medication   0.9 %  sodium chloride infusion    Allergies as of 05/26/2022 - Review Complete 05/26/2022  Allergen Reaction Noted   Ace inhibitors  08/07/2007    Past Medical History:  Diagnosis Date   Acute pharyngitis 01/26/2009   ALLERGIC RHINITIS 08/07/2007   Allergy    ARTHRITIS 08/07/2007   BACK PAIN 09/15/2009   BENIGN PROSTATIC HYPERTROPHY 05/20/2008   Cataract    removed bilaterally    EATING DISORDER, HX OF 08/07/2007   ERECTILE DYSFUNCTION 02/18/2008   GERD (gastroesophageal reflux disease)    HYPERLIPIDEMIA 08/07/2007   HYPERTENSION 08/07/2007   NECK PAIN, RIGHT 09/15/2009  Neuromuscular disorder ([place])    carpal tunnel    OSTEOARTHRITIS, KNEES, BILATERAL 09/15/2009   SINUSITIS- ACUTE-NOS 04/30/2010   Sleep apnea    weas cpap     Past Surgical History:  Procedure Laterality Date   COLONOSCOPY     GASTRIC BYPASS  2006   Left foot surgery  2008   Lower back  2004   POLYPECTOMY     Right knee arthroscopy  1994   UPPER GASTROINTESTINAL ENDOSCOPY      Family History  Problem Relation Age of Onset   Hyperlipidemia Father    Heart disease Father    Hypertension Father    Colon polyps Neg Hx    Esophageal cancer Neg Hx    Rectal cancer Neg Hx    Stomach cancer Neg Hx    Pancreatic cancer Neg Hx    Colon cancer Neg Hx      Social History   Socioeconomic History   Marital status: Married    Spouse name: Neoma Laming   Number of children: 2   Years of education: college   Highest education level: Not on file  Occupational History   Not on file  Tobacco Use   Smoking status: Never   Smokeless tobacco: Never  Vaping Use   Vaping Use: Never used  Substance and Sexual Activity   Alcohol use: Yes    Comment: occasional beer    Drug use: No   Sexual activity: Not Currently  Other Topics Concern   Not on file  Social History Narrative   Not on file   Social Determinants of Health   Financial Resource Strain: Low Risk  (01/26/2022)   Overall Financial Resource Strain (CARDIA)    Difficulty of Paying Living Expenses: Not hard at all  Food Insecurity: No Food Insecurity (01/26/2022)   Hunger Vital Sign    Worried About Running Out of Food in the Last Year: Never true    Ran Out of Food in the Last Year: Never true  Transportation Needs: No Transportation Needs (01/26/2022)   PRAPARE - Hydrologist (Medical): No    Lack of Transportation (Non-Medical): No  Physical Activity: Sufficiently Active (01/26/2022)   Exercise Vital Sign    Days of Exercise per Week: 5 days    Minutes of Exercise per Session: 40 min  Stress: No Stress Concern Present (01/26/2022)   [place]    Feeling of Stress : Not at all  Social Connections: [place] (01/26/2022)   Social Connection and Isolation Panel [NHANES]    Frequency of Communication with Friends and Family: More than three times a week    Frequency of Social Gatherings with Friends and Family: More than three times a week    Attends Religious Services: More than 4 times per year    Active Member of [employer] or Organizations: Yes    Attends Archivist Meetings: More than 4 times per year    Marital Status: Married  Human resources officer Violence: Not At  Risk (01/26/2022)   Humiliation, Afraid, Rape, and Kick questionnaire    Fear of Current or Ex-Partner: No    Emotionally Abused: No    Physically Abused: No    Sexually Abused: No    Review of Systems  Respiratory:  Positive for apnea.   Psychiatric/Behavioral:  Positive for sleep disturbance.   All other systems reviewed and are negative.   Vitals:   05/26/22 1353  BP: 122/62  Pulse: 81  SpO2: 95%     Physical Exam Constitutional:      Appearance: He is obese.  HENT:     Head: Normocephalic.     Mouth/Throat:     Mouth: Mucous membranes are moist.     Comments: Mallampati 4, crowded oropharynx Eyes:     General:        Right eye: No discharge.        Left eye: No discharge.     Pupils: Pupils are equal, round, and reactive to light.  Cardiovascular:     Rate and Rhythm: Normal rate and regular rhythm.     Pulses: Normal pulses.     Heart sounds: Normal heart sounds. No murmur heard.    No friction rub.  Pulmonary:     Effort: Pulmonary effort is normal. No respiratory distress.     Breath sounds: Normal breath sounds. No stridor. No wheezing or rhonchi.  Musculoskeletal:     Cervical back: No rigidity or tenderness.  Neurological:     General: No focal deficit present.     Mental Status: He is alert.  Psychiatric:        Mood and Affect: Mood normal.       01/06/2020    3:00 PM  Results of the Epworth flowsheet  Sitting and reading 1  Watching TV 1  Sitting, inactive in a public place (e.g. a theatre or a meeting) 1  As a passenger in a car for an hour without a break 1  Lying down to rest in the afternoon when circumstances permit 1  Sitting and talking to someone 1  Sitting quietly after a lunch without alcohol 1  In a car, while stopped for a few minutes in traffic 0  Total score 7    Epworth Sleepiness Scale of 12 today  Compliance data reveals 100% compliance with CPAP Average use of 5 hours 55 minutes AutoSet of 5-15 95 percentile  pressure of 14.2 Residual AHI of 2.3  Assessment:  Moderate obstructive sleep apnea  Excessive daytime sleepiness  Overall symptoms are better  Class III obesity  Plan/Recommendations: Encourage weight loss efforts  Continue CPAP on a nightly basis  Follow-up in 6 months  DME referral for CPAP supplies    Sherrilyn Rist MD [facility] 05/26/2022, 2:03 PM  CC: Biagio Borg, MD

## 2022-06-01 ENCOUNTER — Telehealth: Payer: Self-pay

## 2022-06-01 NOTE — Telephone Encounter (Signed)
MyChart message sent to patient with lab reminder in 04/2022. Patient read message and did not come for labs. Lab reminder mailed to patient.

## 2022-06-01 NOTE — Telephone Encounter (Signed)
-----   Message from Yevette Edwards, RN sent at 03/02/2022  4:02 PM EST ----- Regarding: Labs CBC, IBC + Ferritin - orders are in epic

## 2022-06-13 ENCOUNTER — Other Ambulatory Visit (INDEPENDENT_AMBULATORY_CARE_PROVIDER_SITE_OTHER): Payer: Medicare Other

## 2022-06-13 DIAGNOSIS — D509 Iron deficiency anemia, unspecified: Secondary | ICD-10-CM

## 2022-06-13 DIAGNOSIS — Z9884 Bariatric surgery status: Secondary | ICD-10-CM | POA: Diagnosis not present

## 2022-06-13 LAB — CBC WITH DIFFERENTIAL/PLATELET
Basophils Absolute: 0 10*3/uL (ref 0.0–0.1)
Basophils Relative: 0.5 % (ref 0.0–3.0)
Eosinophils Absolute: 0.1 10*3/uL (ref 0.0–0.7)
Eosinophils Relative: 2.2 % (ref 0.0–5.0)
HCT: 43 % (ref 39.0–52.0)
Hemoglobin: 14.2 g/dL (ref 13.0–17.0)
Lymphocytes Relative: 17.1 % (ref 12.0–46.0)
Lymphs Abs: 1.2 10*3/uL (ref 0.7–4.0)
MCHC: 33.1 g/dL (ref 30.0–36.0)
MCV: 89.6 fl (ref 78.0–100.0)
Monocytes Absolute: 0.6 10*3/uL (ref 0.1–1.0)
Monocytes Relative: 8.9 % (ref 3.0–12.0)
Neutro Abs: 4.9 10*3/uL (ref 1.4–7.7)
Neutrophils Relative %: 71.3 % (ref 43.0–77.0)
Platelets: 215 10*3/uL (ref 150.0–400.0)
RBC: 4.8 Mil/uL (ref 4.22–5.81)
RDW: 15 % (ref 11.5–15.5)
WBC: 6.9 10*3/uL (ref 4.0–10.5)

## 2022-06-13 LAB — IBC + FERRITIN
Ferritin: 15.6 ng/mL — ABNORMAL LOW (ref 22.0–322.0)
Iron: 51 ug/dL (ref 42–165)
Saturation Ratios: 16.6 % — ABNORMAL LOW (ref 20.0–50.0)
TIBC: 308 ug/dL (ref 250.0–450.0)
Transferrin: 220 mg/dL (ref 212.0–360.0)

## 2022-06-28 ENCOUNTER — Encounter: Payer: Self-pay | Admitting: Internal Medicine

## 2022-06-28 ENCOUNTER — Ambulatory Visit (INDEPENDENT_AMBULATORY_CARE_PROVIDER_SITE_OTHER): Payer: Medicare Other | Admitting: Internal Medicine

## 2022-06-28 VITALS — BP 122/72 | HR 70 | Temp 98.7°F | Ht 71.0 in | Wt 287.0 lb

## 2022-06-28 DIAGNOSIS — E7800 Pure hypercholesterolemia, unspecified: Secondary | ICD-10-CM

## 2022-06-28 DIAGNOSIS — E538 Deficiency of other specified B group vitamins: Secondary | ICD-10-CM

## 2022-06-28 DIAGNOSIS — E611 Iron deficiency: Secondary | ICD-10-CM | POA: Diagnosis not present

## 2022-06-28 DIAGNOSIS — R739 Hyperglycemia, unspecified: Secondary | ICD-10-CM

## 2022-06-28 DIAGNOSIS — G8929 Other chronic pain: Secondary | ICD-10-CM | POA: Diagnosis not present

## 2022-06-28 DIAGNOSIS — E559 Vitamin D deficiency, unspecified: Secondary | ICD-10-CM

## 2022-06-28 DIAGNOSIS — I10 Essential (primary) hypertension: Secondary | ICD-10-CM

## 2022-06-28 DIAGNOSIS — M545 Low back pain, unspecified: Secondary | ICD-10-CM

## 2022-06-28 DIAGNOSIS — N320 Bladder-neck obstruction: Secondary | ICD-10-CM | POA: Diagnosis not present

## 2022-06-28 DIAGNOSIS — E785 Hyperlipidemia, unspecified: Secondary | ICD-10-CM

## 2022-06-28 LAB — LIPID PANEL
Cholesterol: 135 mg/dL (ref 0–200)
HDL: 52.4 mg/dL (ref >39.00)
LDL Cholesterol: 64 mg/dL (ref 0–99)
NonHDL: 82.33
Total CHOL/HDL Ratio: 3
Triglycerides: 91 mg/dL (ref 0.0–149.0)
VLDL: 18.2 mg/dL (ref 0.0–40.0)

## 2022-06-28 LAB — BASIC METABOLIC PANEL
BUN: 16 mg/dL (ref 6–23)
CO2: 30 mEq/L (ref 19–32)
Calcium: 9.4 mg/dL (ref 8.4–10.5)
Chloride: 103 mEq/L (ref 96–112)
Creatinine, Ser: 0.78 mg/dL (ref 0.40–1.50)
GFR: 89.75 mL/min (ref >60.00)
Glucose, Bld: 91 mg/dL (ref 70–99)
Potassium: 4.3 mEq/L (ref 3.5–5.1)
Sodium: 139 mEq/L (ref 135–145)

## 2022-06-28 LAB — CBC WITH DIFFERENTIAL/PLATELET
Basophils Absolute: 0 10*3/uL (ref 0.0–0.1)
Basophils Relative: 0.6 % (ref 0.0–3.0)
Eosinophils Absolute: 0.1 10*3/uL (ref 0.0–0.7)
Eosinophils Relative: 2 % (ref 0.0–5.0)
HCT: 43.8 % (ref 39.0–52.0)
Hemoglobin: 14.6 g/dL (ref 13.0–17.0)
Lymphocytes Relative: 15.4 % (ref 12.0–46.0)
Lymphs Abs: 1 10*3/uL (ref 0.7–4.0)
MCHC: 33.3 g/dL (ref 30.0–36.0)
MCV: 90 fl (ref 78.0–100.0)
Monocytes Absolute: 0.6 10*3/uL (ref 0.1–1.0)
Monocytes Relative: 8.7 % (ref 3.0–12.0)
Neutro Abs: 4.6 10*3/uL (ref 1.4–7.7)
Neutrophils Relative %: 73.3 % (ref 43.0–77.0)
Platelets: 203 10*3/uL (ref 150.0–400.0)
RBC: 4.86 Mil/uL (ref 4.22–5.81)
RDW: 14.8 % (ref 11.5–15.5)
WBC: 6.3 10*3/uL (ref 4.0–10.5)

## 2022-06-28 LAB — HEPATIC FUNCTION PANEL
ALT: 20 U/L (ref 0–53)
AST: 19 U/L (ref 0–37)
Albumin: 4.1 g/dL (ref 3.5–5.2)
Alkaline Phosphatase: 80 U/L (ref 39–117)
Bilirubin, Direct: 0.1 mg/dL (ref 0.0–0.3)
Total Bilirubin: 0.5 mg/dL (ref 0.2–1.2)
Total Protein: 6.5 g/dL (ref 6.0–8.3)

## 2022-06-28 LAB — PSA: PSA: 2.14 ng/mL (ref 0.10–4.00)

## 2022-06-28 LAB — TSH: TSH: 0.61 u[IU]/mL (ref 0.35–5.50)

## 2022-06-28 LAB — HEMOGLOBIN A1C: Hgb A1c MFr Bld: 6 % (ref 4.6–6.5)

## 2022-06-28 MED ORDER — CYCLOBENZAPRINE HCL 5 MG PO TABS: 5.0000 mg | ORAL_TABLET | Freq: Three times a day (TID) | ORAL | 2 refills | Status: DC | PRN

## 2022-06-28 MED ORDER — POLYSACCHARIDE IRON COMPLEX 150 MG PO CAPS: ORAL_CAPSULE | ORAL | 3 refills | Status: DC

## 2022-06-28 NOTE — Patient Instructions (Signed)
Ok to increase the iron supplement to 2 per day  Please take all new medication as prescribed - the muscle relaxer as needed  Please continue all other medications as before, including the tramadol  Please have the pharmacy call with any other refills you may need.  Please continue your efforts at being more active, low cholesterol diet, and weight control.  Please keep your appointments with your specialists as you may have planned  Please go to the LAB at the blood drawing area for the tests to be done  You will be contacted by phone if any changes need to be made immediately.  Otherwise, you will receive a letter about your results with an explanation, but please check with MyChart first.  Please remember to sign up for MyChart if you have not done so, as this will be important to you in the future with finding out test results, communicating by private email, and scheduling acute appointments online when needed.  Please make an Appointment to return in 6 months, or sooner if needed

## 2022-06-28 NOTE — Progress Notes (Unsigned)
Patient ID: Joshua Min Sr., male   DOB: 05-22-1950, 72 y.o.   MRN: [id number]        Chief Complaint: follow up iron deficiency, low back pain, low b12, hld, hyperglycemia, low vit d       HPI:  Joshua Schuyler Sr. is a 72 y.o. male here overall doing ok,  No overt bleeding or bruising.  Pt denies chest pain, increased sob or doe, wheezing, orthopnea, PND, increased LE swelling, palpitations, dizziness or syncope. Pt denies polydipsia, polyuria, or new focal neuro s/s.    Pt denies fever, wt loss, night sweats, loss of appetite, or other constitutional symptoms  Has had flare of lbp , using more tramadol than usual last 10 days.  Pt continues to have recurring LBP without bowel or bladder change, fever, wt loss,  worsening LE pain/numbness/weakness, gait change or falls.  Has seen GI and iron med increased to 2 per day but needs new rx for possible long term need.  . Wt Readings from Last 3 Encounters:  06/28/22 287 lb (130.2 kg)  05/26/22 287 lb 3.2 oz (130.3 kg)  04/28/22 283 lb (128.4 kg)   BP Readings from Last 3 Encounters:  06/28/22 122/72  05/26/22 122/62  04/28/22 (!) 158/90         Past Medical History:  Diagnosis Date   Acute pharyngitis 01/26/2009   ALLERGIC RHINITIS 08/07/2007   Allergy    ARTHRITIS 08/07/2007   BACK PAIN 09/15/2009   BENIGN PROSTATIC HYPERTROPHY 05/20/2008   Cataract    removed bilaterally    EATING DISORDER, HX OF 08/07/2007   ERECTILE DYSFUNCTION 02/18/2008   GERD (gastroesophageal reflux disease)    HYPERLIPIDEMIA 08/07/2007   HYPERTENSION 08/07/2007   NECK PAIN, RIGHT 09/15/2009   Neuromuscular disorder    carpal tunnel    OSTEOARTHRITIS, KNEES, BILATERAL 09/15/2009   SINUSITIS- ACUTE-NOS 04/30/2010   Sleep apnea    weas cpap    Past Surgical History:  Procedure Laterality Date   COLONOSCOPY     GASTRIC BYPASS  2006   Left foot surgery  2008   Lower back  2004   POLYPECTOMY     Right knee arthroscopy  1994   UPPER GASTROINTESTINAL  ENDOSCOPY      reports that he has never smoked. He has never used smokeless tobacco. He reports current alcohol use. He reports that he does not use drugs. family history includes Heart disease in his father; Hyperlipidemia in his father; Hypertension in his father. Allergies  Allergen Reactions   Ace Inhibitors     REACTION: tongue swelling   Current Outpatient Medications on File Prior to Visit  Medication Sig Dispense Refill   acetaminophen (TYLENOL 8 HOUR) 650 MG CR tablet 4 tablets twice a day by oral route.     amLODipine (NORVASC) 5 MG tablet Take 1 tablet (5 mg total) by mouth daily. 90 tablet 3   atorvastatin (LIPITOR) 40 MG tablet Take 1 tablet (40 mg total) by mouth daily. 30 tablet 0   Bacillus Coagulans-Inulin (PROBIOTIC) 1-250 BILLION-MG CAPS      Cholecalciferol (VITAMIN D3 PO) Take 5,000 Int'l Units by mouth.     Cholecalciferol 250 MCG (10000 UT) TABS      desloratadine (CLARINEX) 5 MG tablet TAKE 1 TABLET BY MOUTH EVERY DAY 90 tablet 3   FISH OIL-KRILL OIL PO 500 mg.     furosemide (LASIX) 20 MG tablet 1 tab by mouth in the AM, and 1 in the PM  as needed for persistent swelling 180 tablet 3   loratadine (CLARITIN) 10 MG tablet Take 10 mg by mouth daily.     omeprazole (PRILOSEC) 40 MG capsule Take 1 capsule (40 mg total) by mouth daily. 90 capsule 3   Pediatric Multivit-Minerals (FLINTSTONES COMPLETE PO) Take by mouth.     Probiotic Product (PROBIOTIC DAILY PO) Take by mouth. [employer] colon health     tadalafil (CIALIS) 20 MG tablet TAKE 1 TABLET BY MOUTH EVERY DAY AS NEEDED 6 tablet 9   tamsulosin (FLOMAX) 0.4 MG CAPS capsule Take 0.8 mg by mouth daily.     tiZANidine (ZANAFLEX) 2 MG tablet TAKE 1 TABLET BY MOUTH EVERYDAY AT BEDTIME 90 tablet 1   traMADol (ULTRAM) 50 MG tablet TAKE 1 TABLET BY MOUTH EVERY 6 HOURS AS NEEDED FOR MODERATE PAIN 120 tablet 5   Turmeric 1053 MG TABS turmeric     No current facility-administered medications on file prior to visit.         ROS:  All others reviewed and negative.  Objective        PE:  BP 122/72 (BP Location: Left Arm, Patient Position: Sitting, Cuff Size: Normal)   Pulse 70   Temp 98.7 F (37.1 C) (Oral)   Ht 5\' 11"  (1.803 m)   Wt 287 lb (130.2 kg)   SpO2 95%   BMI 40.03 kg/m                 Constitutional: Pt appears in NAD               HENT: Head: NCAT.                Right Ear: External ear normal.                 Left Ear: External ear normal.                Eyes: . Pupils are equal, round, and reactive to light. Conjunctivae and EOM are normal               Nose: without d/c or deformity               Neck: Neck supple. Gross normal ROM               Cardiovascular: Normal rate and regular rhythm.                 Pulmonary/Chest: Effort normal and breath sounds without rales or wheezing.                Abd:  Soft, NT, ND, + BS, no organomegaly               Neurological: Pt is alert. At baseline orientation, motor grossly intact               Skin: Skin is warm. No rashes, no other new lesions, LE edema - none               Psychiatric: Pt behavior is normal without agitation   Micro: none  Cardiac tracings I have personally interpreted today:  none  Pertinent Radiological findings (summarize): none   Lab Results  Component Value Date   WBC 6.3 06/28/2022   HGB 14.6 06/28/2022   HCT 43.8 06/28/2022   PLT 203.0 06/28/2022   GLUCOSE 91 06/28/2022   CHOL 135 06/28/2022   TRIG 91.0 06/28/2022   HDL 52.40 06/28/2022   LDLDIRECT  157.6 01/02/2009   LDLCALC 64 06/28/2022   ALT 20 06/28/2022   AST 19 06/28/2022   NA 139 06/28/2022   K 4.3 06/28/2022   CL 103 06/28/2022   CREATININE 0.78 06/28/2022   BUN 16 06/28/2022   CO2 30 06/28/2022   TSH 0.61 06/28/2022   PSA 2.14 06/28/2022   HGBA1C 6.0 06/28/2022   MICROALBUR <0.7 06/23/2021   Assessment/Plan:  Joshua Min Sr. is a 72 y.o. Black or African American [2] male with  has a past medical history of Acute pharyngitis  (01/26/2009), ALLERGIC RHINITIS (08/07/2007), Allergy, ARTHRITIS (08/07/2007), BACK PAIN (09/15/2009), BENIGN PROSTATIC HYPERTROPHY (05/20/2008), Cataract, EATING DISORDER, HX OF (08/07/2007), ERECTILE DYSFUNCTION (02/18/2008), GERD (gastroesophageal reflux disease), HYPERLIPIDEMIA (08/07/2007), HYPERTENSION (08/07/2007), NECK PAIN, RIGHT (09/15/2009), Neuromuscular disorder, OSTEOARTHRITIS, KNEES, BILATERAL (09/15/2009), SINUSITIS- ACUTE-NOS (04/30/2010), and Sleep apnea.  B12 deficiency Lab Results  Component Value Date   VITAMINB12 >1500 (H) 06/28/2022   Stable, cont replacement - b12 1000 mcg qd   Essential hypertension BP Readings from Last 3 Encounters:  06/28/22 122/72  05/26/22 122/62  04/28/22 (!) 158/90   Stable, pt to continue medical treatment norvasc 5 qd, avapro 150 qd   HLD (hyperlipidemia) Lab Results  Component Value Date   LDLCALC 64 06/28/2022   Stable, pt to continue current statin lipitor 40 mg qd  Hyperglycemia Lab Results  Component Value Date   HGBA1C 6.0 06/28/2022   Stable, pt to continue current medical treatment  - diet, wt control   Iron deficiency Ok for increased niferex 150 bid, f/u lab next visit  Vitamin D deficiency Last vitamin D Lab Results  Component Value Date   VD25OH 46.34 06/28/2022   Stable, cont oral replacement   Backache Ok for add muscle relaxer flexeril 10 tid prn as he has tolerated in past  Followup: Return in about 6 months (around 12/28/2022).  Joshua Barre, MD 06/29/2022 6:59 PM [facility]

## 2022-06-29 ENCOUNTER — Encounter: Payer: Self-pay | Admitting: Internal Medicine

## 2022-06-29 ENCOUNTER — Other Ambulatory Visit: Payer: Self-pay

## 2022-06-29 LAB — VITAMIN B12: Vitamin B-12: >1500 pg/mL — ABNORMAL HIGH (ref 211–911)

## 2022-06-29 LAB — VITAMIN D 25 HYDROXY (VIT D DEFICIENCY, FRACTURES): VITD: 46.34 ng/mL (ref 30.00–100.00)

## 2022-06-29 MED ORDER — IRBESARTAN 150 MG PO TABS: 150.0000 mg | ORAL_TABLET | Freq: Every day | ORAL | 3 refills | Status: DC

## 2022-06-29 NOTE — Assessment & Plan Note (Signed)
BP Readings from Last 3 Encounters:  06/28/22 122/72  05/26/22 122/62  04/28/22 (!) 158/90   Stable, pt to continue medical treatment norvasc 5 qd, avapro 150 qd

## 2022-06-29 NOTE — Assessment & Plan Note (Addendum)
Lab Results  Component Value Date   VITAMINB12 >1500 (H) 06/28/2022   Stable, cont replacement - b12 1000 mcg qd

## 2022-06-29 NOTE — Progress Notes (Signed)
The test results show that your current treatment is OK, as the tests are stable.  Please continue the same plan.  There is no other need for change of treatment or further evaluation based on these results, at this time.  thanks 

## 2022-06-29 NOTE — Assessment & Plan Note (Signed)
Ok for add muscle relaxer flexeril 10 tid prn as he has tolerated in past

## 2022-06-29 NOTE — Assessment & Plan Note (Signed)
Lab Results  Component Value Date   LDLCALC 64 06/28/2022   Stable, pt to continue current statin lipitor 40 mg qd

## 2022-06-29 NOTE — Assessment & Plan Note (Signed)
Lab Results  Component Value Date   HGBA1C 6.0 06/28/2022   Stable, pt to continue current medical treatment  - diet, wt control

## 2022-06-29 NOTE — Assessment & Plan Note (Signed)
Last vitamin D Lab Results  Component Value Date   VD25OH 46.34 06/28/2022   Stable, cont oral replacement

## 2022-06-29 NOTE — Assessment & Plan Note (Signed)
Ok for increased niferex 150 bid, f/u lab next visit

## 2022-06-30 LAB — URINALYSIS, ROUTINE W REFLEX MICROSCOPIC
Bilirubin Urine: NEGATIVE
Hgb urine dipstick: NEGATIVE
Ketones, ur: NEGATIVE
Leukocytes,Ua: NEGATIVE
Nitrite: NEGATIVE
RBC / HPF: NONE SEEN (ref 0–?)
Specific Gravity, Urine: 1.02 (ref 1.000–1.030)
Total Protein, Urine: NEGATIVE
Urine Glucose: NEGATIVE
Urobilinogen, UA: 1 (ref 0.0–1.0)
pH: 6 (ref 5.0–8.0)

## 2022-06-30 LAB — MICROALBUMIN / CREATININE URINE RATIO
Creatinine,U: 158.6 mg/dL
Microalb Creat Ratio: 0.4 mg/g (ref 0.0–30.0)
Microalb, Ur: <0.7 mg/dL (ref 0.0–1.9)

## 2022-06-30 NOTE — Progress Notes (Signed)
The test results show that your current treatment is OK, as the tests are stable.  Please continue the same plan.  There is no other need for change of treatment or further evaluation based on these results, at this time.  thanks 

## 2022-08-08 ENCOUNTER — Other Ambulatory Visit: Payer: Self-pay

## 2022-08-08 MED ORDER — ATORVASTATIN CALCIUM 40 MG PO TABS: 40.0000 mg | ORAL_TABLET | Freq: Every day | ORAL | 0 refills | Status: DC

## 2022-10-10 ENCOUNTER — Telehealth: Payer: Self-pay

## 2022-10-10 DIAGNOSIS — D509 Iron deficiency anemia, unspecified: Secondary | ICD-10-CM

## 2022-10-10 NOTE — Telephone Encounter (Signed)
-----   Message from [facility] [place] [facility] sent at 06/14/2022 11:41 AM EDT ----- Regarding: due for labs CBC and TIBC ferritin panel due in  August - one day this week or next Place orders

## 2022-10-10 NOTE — Telephone Encounter (Signed)
Labs are in.  MyChart message to patient to go to the lab for Dr. Adela Lank.

## 2022-10-14 ENCOUNTER — Other Ambulatory Visit: Payer: Medicare Other

## 2022-10-14 DIAGNOSIS — D509 Iron deficiency anemia, unspecified: Secondary | ICD-10-CM | POA: Diagnosis not present

## 2022-10-14 LAB — CBC WITH DIFFERENTIAL/PLATELET
Basophils Absolute: 0 10*3/uL (ref 0.0–0.1)
Basophils Relative: 0.6 % (ref 0.0–3.0)
Eosinophils Absolute: 0.2 10*3/uL (ref 0.0–0.7)
Eosinophils Relative: 2.3 % (ref 0.0–5.0)
HCT: 44.6 % (ref 39.0–52.0)
Hemoglobin: 14.6 g/dL (ref 13.0–17.0)
Lymphocytes Relative: 16.5 % (ref 12.0–46.0)
Lymphs Abs: 1.2 10*3/uL (ref 0.7–4.0)
MCHC: 32.9 g/dL (ref 30.0–36.0)
MCV: 92.6 fl (ref 78.0–100.0)
Monocytes Absolute: 0.7 10*3/uL (ref 0.1–1.0)
Monocytes Relative: 9.3 % (ref 3.0–12.0)
Neutro Abs: 5.1 10*3/uL (ref 1.4–7.7)
Neutrophils Relative %: 71.3 % (ref 43.0–77.0)
Platelets: 198 10*3/uL (ref 150.0–400.0)
RBC: 4.82 Mil/uL (ref 4.22–5.81)
RDW: 13.5 % (ref 11.5–15.5)
WBC: 7.2 10*3/uL (ref 4.0–10.5)

## 2022-10-14 LAB — IBC + FERRITIN
Ferritin: 22.7 ng/mL (ref 22.0–322.0)
Iron: 69 ug/dL (ref 42–165)
Saturation Ratios: 22.4 % (ref 20.0–50.0)
TIBC: 308 ug/dL (ref 250.0–450.0)
Transferrin: 220 mg/dL (ref 212.0–360.0)

## 2022-10-18 ENCOUNTER — Telehealth: Payer: Self-pay | Admitting: Internal Medicine

## 2022-10-18 NOTE — Telephone Encounter (Signed)
Prescription Request  10/18/2022  LOV: 06/28/2022  What is the name of the medication or equipment? atorvastatin  Have you contacted your pharmacy to request a refill? Yes   Which pharmacy would you like this sent to?  [id number] - [place], [place] - [address] [place] [address] Phone: [phone] Fax: [phone]    Patient notified that their request is being sent to the clinical staff for review and that they should receive a response within 2 business days.   Please advise at Mobile [phone] (mobile)

## 2022-10-19 ENCOUNTER — Other Ambulatory Visit: Payer: Self-pay

## 2022-10-19 MED ORDER — ATORVASTATIN CALCIUM 40 MG PO TABS: 40.0000 mg | ORAL_TABLET | Freq: Every day | ORAL | 3 refills | Status: DC

## 2022-10-19 NOTE — Telephone Encounter (Signed)
Refill has been sent.  °

## 2022-10-28 ENCOUNTER — Encounter: Payer: Self-pay | Admitting: Internal Medicine

## 2022-10-28 ENCOUNTER — Ambulatory Visit (INDEPENDENT_AMBULATORY_CARE_PROVIDER_SITE_OTHER): Payer: Medicare Other | Admitting: Internal Medicine

## 2022-10-28 VITALS — BP 136/74 | HR 75 | Temp 98.7°F | Ht 71.0 in | Wt 285.0 lb

## 2022-10-28 DIAGNOSIS — R051 Acute cough: Secondary | ICD-10-CM

## 2022-10-28 DIAGNOSIS — E7800 Pure hypercholesterolemia, unspecified: Secondary | ICD-10-CM | POA: Diagnosis not present

## 2022-10-28 DIAGNOSIS — R739 Hyperglycemia, unspecified: Secondary | ICD-10-CM | POA: Diagnosis not present

## 2022-10-28 DIAGNOSIS — E538 Deficiency of other specified B group vitamins: Secondary | ICD-10-CM

## 2022-10-28 DIAGNOSIS — E559 Vitamin D deficiency, unspecified: Secondary | ICD-10-CM

## 2022-10-28 DIAGNOSIS — I10 Essential (primary) hypertension: Secondary | ICD-10-CM

## 2022-10-28 LAB — POC COVID19 BINAXNOW: SARS Coronavirus 2 Ag: NEGATIVE

## 2022-10-28 MED ORDER — HYDROCODONE BIT-HOMATROP MBR 5-1.5 MG/5ML PO SOLN: 5.0000 mL | Freq: Four times a day (QID) | ORAL | 0 refills | Status: AC | PRN

## 2022-10-28 MED ORDER — TADALAFIL 20 MG PO TABS: 20.0000 mg | ORAL_TABLET | Freq: Every day | ORAL | 11 refills | Status: DC | PRN

## 2022-10-28 MED ORDER — DOXYCYCLINE HYCLATE 100 MG PO TABS: 100.0000 mg | ORAL_TABLET | Freq: Two times a day (BID) | ORAL | 0 refills | Status: DC

## 2022-10-28 MED ORDER — OMEPRAZOLE 40 MG PO CPDR: 40.0000 mg | DELAYED_RELEASE_CAPSULE | Freq: Every day | ORAL | 3 refills | Status: DC

## 2022-10-28 MED ORDER — FUROSEMIDE 20 MG PO TABS: ORAL_TABLET | ORAL | 3 refills | Status: AC

## 2022-10-28 MED ORDER — AMLODIPINE BESYLATE 5 MG PO TABS: 5.0000 mg | ORAL_TABLET | Freq: Every day | ORAL | 3 refills | Status: DC

## 2022-10-28 MED ORDER — TAMSULOSIN HCL 0.4 MG PO CAPS: 0.8000 mg | ORAL_CAPSULE | Freq: Every day | ORAL | 3 refills | Status: AC

## 2022-10-28 MED ORDER — ATORVASTATIN CALCIUM 40 MG PO TABS: 40.0000 mg | ORAL_TABLET | Freq: Every day | ORAL | 3 refills | Status: DC

## 2022-10-28 MED ORDER — TRAMADOL HCL 50 MG PO TABS: ORAL_TABLET | ORAL | 5 refills | Status: DC

## 2022-10-28 MED ORDER — IRBESARTAN 150 MG PO TABS: 150.0000 mg | ORAL_TABLET | Freq: Every day | ORAL | 3 refills | Status: DC

## 2022-10-28 NOTE — Assessment & Plan Note (Signed)
Last vitamin D Lab Results  Component Value Date   VD25OH 46.34 06/28/2022   Stable, cont oral replacement

## 2022-10-28 NOTE — Assessment & Plan Note (Signed)
Mild to mod, for antibx course - doxycycline 100 bid, cough med prn, to f/u any worsening symptoms or concerns

## 2022-10-28 NOTE — Assessment & Plan Note (Signed)
Lab Results  Component Value Date   HGBA1C 6.0 06/28/2022   Stable, pt to continue current medical treatment  - diet, wt control

## 2022-10-28 NOTE — Assessment & Plan Note (Signed)
BP Readings from Last 3 Encounters:  10/28/22 136/74  06/28/22 122/72  05/26/22 122/62   Stable, pt to continue medical treatment norvasc 5 mg every day, avapro 150 qd

## 2022-10-28 NOTE — Assessment & Plan Note (Signed)
Lab Results  Component Value Date   VITAMINB12 >1500 (H) 06/28/2022   Stable, cont oral replacement - b12 1000 mcg qd

## 2022-10-28 NOTE — Patient Instructions (Addendum)
Your covid testing was done today - negative  Please take all new medication as prescribed  - the antibiotic, and cough medicine  Please continue all other medications as before, and all refills have been done as requested.  Please have the pharmacy call with any other refills you may need.  Please continue your efforts at being more active, low cholesterol diet, and weight control.  Please keep your appointments with your specialists as you may have planned  We can hold on further lab testing today  Please make an Appointment to return in 6 months, or sooner if needed

## 2022-10-28 NOTE — Assessment & Plan Note (Signed)
Lab Results  Component Value Date   LDLCALC 64 06/28/2022   Stable, pt to continue current statin lipitor 40 qd

## 2022-10-28 NOTE — Progress Notes (Signed)
Patient ID: Joshua Min Sr., male   DOB: [birth year], 72 y.o.   MRN: [id number]        Chief Complaint: follow up uri symptoms with cough,  htn, hld, hyperglycemia, low b12 and vit D       HPI:  Joshua Melendres Sr. is a 72 y.o. male  Here with 2-3 days acute onset fever, facial pain, pressure, headache, general weakness and malaise, and greenish d/c, with mild ST and cough, but pt denies chest pain, wheezing, increased sob or doe, orthopnea, PND, increased LE swelling, palpitations, dizziness or syncope.   Pt denies polydipsia, polyuria, or new focal neuro s/s.    Pt denies fever, wt loss, night sweats, loss of appetite, or other constitutional symptoms  Past Medical History:  Diagnosis Date   Acute pharyngitis 01/26/2009   ALLERGIC RHINITIS 08/07/2007   Allergy    ARTHRITIS 08/07/2007   BACK PAIN 09/15/2009   BENIGN PROSTATIC HYPERTROPHY 05/20/2008   Cataract    removed bilaterally    EATING DISORDER, HX OF 08/07/2007   ERECTILE DYSFUNCTION 02/18/2008   GERD (gastroesophageal reflux disease)    HYPERLIPIDEMIA 08/07/2007   HYPERTENSION 08/07/2007   NECK PAIN, RIGHT 09/15/2009   Neuromuscular disorder (HCC)    carpal tunnel    OSTEOARTHRITIS, KNEES, BILATERAL 09/15/2009   SINUSITIS- ACUTE-NOS 04/30/2010   Sleep apnea    weas cpap    Past Surgical History:  Procedure Laterality Date   COLONOSCOPY     GASTRIC BYPASS  2006   Left foot surgery  2008   Lower back  2004   POLYPECTOMY     Right knee arthroscopy  1994   UPPER GASTROINTESTINAL ENDOSCOPY      reports that he has never smoked. He has never used smokeless tobacco. He reports current alcohol use. He reports that he does not use drugs. family history includes Heart disease in his father; Hyperlipidemia in his father; Hypertension in his father.  Wt Readings from Last 3 Encounters:  10/28/22 285 lb (129.3 kg)  06/28/22 287 lb (130.2 kg)  05/26/22 287 lb 3.2 oz (130.3 kg)   BP Readings from Last 3 Encounters:  10/28/22  136/74  06/28/22 122/72  05/26/22 122/62         Past Medical History:  Diagnosis Date   Acute pharyngitis 01/26/2009   ALLERGIC RHINITIS 08/07/2007   Allergy    ARTHRITIS 08/07/2007   BACK PAIN 09/15/2009   BENIGN PROSTATIC HYPERTROPHY 05/20/2008   Cataract    removed bilaterally    EATING DISORDER, HX OF 08/07/2007   ERECTILE DYSFUNCTION 02/18/2008   GERD (gastroesophageal reflux disease)    HYPERLIPIDEMIA 08/07/2007   HYPERTENSION 08/07/2007   NECK PAIN, RIGHT 09/15/2009   Neuromuscular disorder (HCC)    carpal tunnel    OSTEOARTHRITIS, KNEES, BILATERAL 09/15/2009   SINUSITIS- ACUTE-NOS 04/30/2010   Sleep apnea    weas cpap    Past Surgical History:  Procedure Laterality Date   COLONOSCOPY     GASTRIC BYPASS  2006   Left foot surgery  2008   Lower back  2004   POLYPECTOMY     Right knee arthroscopy  1994   UPPER GASTROINTESTINAL ENDOSCOPY      reports that he has never smoked. He has never used smokeless tobacco. He reports current alcohol use. He reports that he does not use drugs. family history includes Heart disease in his father; Hyperlipidemia in his father; Hypertension in his father. Allergies  Allergen Reactions   Ace  Inhibitors     REACTION: tongue swelling   Current Outpatient Medications on File Prior to Visit  Medication Sig Dispense Refill   acetaminophen (TYLENOL 8 HOUR) 650 MG CR tablet 4 tablets twice a day by oral route.     Bacillus Coagulans-Inulin (PROBIOTIC) 1-250 BILLION-MG CAPS      Cholecalciferol (VITAMIN D3 PO) Take 5,000 Int'l Units by mouth.     Cholecalciferol 250 MCG (10000 UT) TABS      cyclobenzaprine (FLEXERIL) 5 MG tablet Take 1 tablet (5 mg total) by mouth 3 (three) times daily as needed for muscle spasms. 40 tablet 2   desloratadine (CLARINEX) 5 MG tablet TAKE 1 TABLET BY MOUTH EVERY DAY 90 tablet 3   FISH OIL-KRILL OIL PO 500 mg.     iron polysaccharides (NIFEREX) 150 MG capsule TAKE 2 CAPSULE(150 MG) BY MOUTH DAILY 180 capsule 3    Pediatric Multivit-Minerals (FLINTSTONES COMPLETE PO) Take by mouth.     Probiotic Product (PROBIOTIC DAILY PO) Take by mouth. [employer] colon health     tiZANidine (ZANAFLEX) 2 MG tablet TAKE 1 TABLET BY MOUTH EVERYDAY AT BEDTIME 90 tablet 1   Turmeric 1053 MG TABS turmeric     No current facility-administered medications on file prior to visit.        ROS:  All others reviewed and negative.  Objective        PE:  BP 136/74 (BP Location: Right Arm, Patient Position: Sitting, Cuff Size: Normal)   Pulse 75   Temp 98.7 F (37.1 C) (Oral)   Ht 5\' 11"  (1.803 m)   Wt 285 lb (129.3 kg)   SpO2 97%   BMI 39.75 kg/m                 Constitutional: Pt appears in NAD               HENT: Head: NCAT.                Right Ear: External ear normal.                 Left Ear: External ear normal. Bilat tm's with mild erythema.  Max sinus areas mild tender.  Pharynx with mild erythema, no exudate               Eyes: . Pupils are equal, round, and reactive to light. Conjunctivae and EOM are normal               Nose: without d/c or deformity               Neck: Neck supple. Gross normal ROM               Cardiovascular: Normal rate and regular rhythm.                 Pulmonary/Chest: Effort normal and breath sounds without rales or wheezing.                Abd:  Soft, NT, ND, + BS, no organomegaly               Neurological: Pt is alert. At baseline orientation, motor grossly intact               Skin: Skin is warm. No rashes, no other new lesions, LE edema - none               Psychiatric: Pt behavior is normal without agitation   Micro:  none  Cardiac tracings I have personally interpreted today:  none  Pertinent Radiological findings (summarize): none   Lab Results  Component Value Date   WBC 7.2 10/14/2022   HGB 14.6 10/14/2022   HCT 44.6 10/14/2022   PLT 198.0 10/14/2022   GLUCOSE 91 06/28/2022   CHOL 135 06/28/2022   TRIG 91.0 06/28/2022   HDL 52.40 06/28/2022   LDLDIRECT 157.6  01/02/2009   LDLCALC 64 06/28/2022   ALT 20 06/28/2022   AST 19 06/28/2022   NA 139 06/28/2022   K 4.3 06/28/2022   CL 103 06/28/2022   CREATININE 0.78 06/28/2022   BUN 16 06/28/2022   CO2 30 06/28/2022   TSH 0.61 06/28/2022   PSA 2.14 06/28/2022   HGBA1C 6.0 06/28/2022   MICROALBUR <0.7 06/28/2022      Component Ref Range & Units 10:56  SARS Coronavirus 2 Ag Negative Negative   Assessment/Plan:  Joshua Min Sr. is a 72 y.o. Black or African American [2] male with  has a past medical history of Acute pharyngitis (01/26/2009), ALLERGIC RHINITIS (08/07/2007), Allergy, ARTHRITIS (08/07/2007), BACK PAIN (09/15/2009), BENIGN PROSTATIC HYPERTROPHY (05/20/2008), Cataract, EATING DISORDER, HX OF (08/07/2007), ERECTILE DYSFUNCTION (02/18/2008), GERD (gastroesophageal reflux disease), HYPERLIPIDEMIA (08/07/2007), HYPERTENSION (08/07/2007), NECK PAIN, RIGHT (09/15/2009), Neuromuscular disorder (HCC), OSTEOARTHRITIS, KNEES, BILATERAL (09/15/2009), SINUSITIS- ACUTE-NOS (04/30/2010), and Sleep apnea.  B12 deficiency Lab Results  Component Value Date   VITAMINB12 >1500 (H) 06/28/2022   Stable, cont oral replacement - b12 1000 mcg qd   Essential hypertension BP Readings from Last 3 Encounters:  10/28/22 136/74  06/28/22 122/72  05/26/22 122/62   Stable, pt to continue medical treatment norvasc 5 mg every day, avapro 150 qd   HLD (hyperlipidemia) Lab Results  Component Value Date   LDLCALC 64 06/28/2022   Stable, pt to continue current statin lipitor 40 qd   Hyperglycemia Lab Results  Component Value Date   HGBA1C 6.0 06/28/2022   Stable, pt to continue current medical treatment  - diet, wt control   Vitamin D deficiency Last vitamin D Lab Results  Component Value Date   VD25OH 46.34 06/28/2022   Stable, cont oral replacement   Cough Mild to mod, for antibx course doxycycline 100 bid, cough med prn, to f/u any worsening symptoms or concerns  Followup: Return in about 6  months (around 04/30/2023).  Oliver Barre, MD 10/28/2022 1:18 PM [facility] - [facility]

## 2022-12-02 ENCOUNTER — Ambulatory Visit (INDEPENDENT_AMBULATORY_CARE_PROVIDER_SITE_OTHER): Payer: Medicare Other | Admitting: Pulmonary Disease

## 2022-12-02 ENCOUNTER — Encounter: Payer: Self-pay | Admitting: Pulmonary Disease

## 2022-12-02 VITALS — BP 136/62 | HR 71 | Temp 97.5°F | Ht 71.0 in | Wt 289.6 lb

## 2022-12-02 DIAGNOSIS — G4733 Obstructive sleep apnea (adult) (pediatric): Secondary | ICD-10-CM | POA: Diagnosis not present

## 2022-12-02 NOTE — Progress Notes (Signed)
Joshua Fawbush Sr.    [id number]    06/07/1950  Primary Care Physician:[name], Len Blalock, MD  Referring Physician: Corwin Levins, MD [address] [place],  [place] [address]  Chief complaint:   History of moderate obstructive sleep apnea  HPI:  Tolerating CPAP well with no concerns today  Does have some musculoskeletal pain and discomfort  Wakes up feeling like he is at a good nights rest  Energy levels remain good during the day  His weight has been stable  No unusual tiredness during the day of fatigue during the day  Sometimes does take naps during the day  No family history of obstructive sleep apnea  Outpatient Encounter Medications as of 12/02/2022  Medication Sig   acetaminophen (TYLENOL 8 HOUR) 650 MG CR tablet 4 tablets twice a day by oral route.   amLODipine (NORVASC) 5 MG tablet Take 1 tablet (5 mg total) by mouth daily.   atorvastatin (LIPITOR) 40 MG tablet Take 1 tablet (40 mg total) by mouth daily.   Cholecalciferol (VITAMIN D3 PO) Take 5,000 Int'l Units by mouth.   Cholecalciferol 250 MCG (10000 UT) TABS    cyclobenzaprine (FLEXERIL) 5 MG tablet Take 1 tablet (5 mg total) by mouth 3 (three) times daily as needed for muscle spasms.   desloratadine (CLARINEX) 5 MG tablet TAKE 1 TABLET BY MOUTH EVERY DAY   doxycycline (VIBRA-TABS) 100 MG tablet Take 1 tablet (100 mg total) by mouth 2 (two) times daily.   FISH OIL-KRILL OIL PO 500 mg.   furosemide (LASIX) 20 MG tablet 1 tab by mouth in the AM, and 1 in the PM as needed for persistent swelling   irbesartan (AVAPRO) 150 MG tablet Take 1 tablet (150 mg total) by mouth daily.   iron polysaccharides (NIFEREX) 150 MG capsule TAKE 2 CAPSULE(150 MG) BY MOUTH DAILY   Pediatric Multivit-Minerals (FLINTSTONES COMPLETE PO) Take by mouth.   Probiotic Product (PROBIOTIC DAILY PO) Take by mouth. [employer] colon health   tadalafil (CIALIS) 20 MG tablet Take 1 tablet (20 mg total) by mouth daily as needed.    tamsulosin (FLOMAX) 0.4 MG CAPS capsule Take 2 capsules (0.8 mg total) by mouth daily.   tiZANidine (ZANAFLEX) 2 MG tablet TAKE 1 TABLET BY MOUTH EVERYDAY AT BEDTIME   traMADol (ULTRAM) 50 MG tablet TAKE 1 TABLET BY MOUTH EVERY 6 HOURS AS NEEDED FOR MODERATE PAIN   Turmeric 1053 MG TABS turmeric   [DISCONTINUED] Bacillus Coagulans-Inulin (PROBIOTIC) 1-250 BILLION-MG CAPS  (Patient not taking: Reported on 12/02/2022)   [DISCONTINUED] omeprazole (PRILOSEC) 40 MG capsule Take 1 capsule (40 mg total) by mouth daily. (Patient not taking: Reported on 12/02/2022)   No facility-administered encounter medications on file as of 12/02/2022.    Allergies as of 12/02/2022 - Review Complete 12/02/2022  Allergen Reaction Noted   Ace inhibitors  08/07/2007    Past Medical History:  Diagnosis Date   Acute pharyngitis 01/26/2009   ALLERGIC RHINITIS 08/07/2007   Allergy    ARTHRITIS 08/07/2007   BACK PAIN 09/15/2009   BENIGN PROSTATIC HYPERTROPHY 05/20/2008   Cataract    removed bilaterally    EATING DISORDER, HX OF 08/07/2007   ERECTILE DYSFUNCTION 02/18/2008   GERD (gastroesophageal reflux disease)    HYPERLIPIDEMIA 08/07/2007   HYPERTENSION 08/07/2007   NECK PAIN, RIGHT 09/15/2009   Neuromuscular disorder (HCC)    carpal tunnel    OSTEOARTHRITIS, KNEES, BILATERAL 09/15/2009   SINUSITIS- ACUTE-NOS 04/30/2010  Sleep apnea    weas cpap     Past Surgical History:  Procedure Laterality Date   COLONOSCOPY     GASTRIC BYPASS  2006   Left foot surgery  2008   Lower back  2004   POLYPECTOMY     Right knee arthroscopy  1994   UPPER GASTROINTESTINAL ENDOSCOPY      Family History  Problem Relation Age of Onset   Hyperlipidemia Father    Heart disease Father    Hypertension Father    Colon polyps Neg Hx    Esophageal cancer Neg Hx    Rectal cancer Neg Hx    Stomach cancer Neg Hx    Pancreatic cancer Neg Hx    Colon cancer Neg Hx     Social History   Socioeconomic History   Marital status: Married     Spouse name: Gavin Pound   Number of children: 2   Years of education: college   Highest education level: Not on file  Occupational History   Not on file  Tobacco Use   Smoking status: Never   Smokeless tobacco: Never  Vaping Use   Vaping status: Never Used  Substance and Sexual Activity   Alcohol use: Yes    Comment: occasional beer    Drug use: No   Sexual activity: Not Currently  Other Topics Concern   Not on file  Social History Narrative   Not on file   Social Determinants of Health   Financial Resource Strain: Low Risk  (01/26/2022)   Overall Financial Resource Strain (CARDIA)    Difficulty of Paying Living Expenses: Not hard at all  Food Insecurity: No Food Insecurity (01/26/2022)   Hunger Vital Sign    Worried About Running Out of Food in the Last Year: Never true    Ran Out of Food in the Last Year: Never true  Transportation Needs: No Transportation Needs (01/26/2022)   PRAPARE - Administrator, Civil Service (Medical): No    Lack of Transportation (Non-Medical): No  Physical Activity: Sufficiently Active (01/26/2022)   Exercise Vital Sign    Days of Exercise per Week: 5 days    Minutes of Exercise per Session: 40 min  Stress: No Stress Concern Present (01/26/2022)   [employer] of Occupational Health - Occupational Stress Questionnaire    Feeling of Stress : Not at all  Social Connections: Socially Integrated (01/26/2022)   Social Connection and Isolation Panel [NHANES]    Frequency of Communication with Friends and Family: More than three times a week    Frequency of Social Gatherings with Friends and Family: More than three times a week    Attends Religious Services: More than 4 times per year    Active Member of [employer] or Organizations: Yes    Attends Banker Meetings: More than 4 times per year    Marital Status: Married  Catering manager Violence: Not At Risk (01/26/2022)   Humiliation, Afraid, Rape, and Kick questionnaire     Fear of Current or Ex-Partner: No    Emotionally Abused: No    Physically Abused: No    Sexually Abused: No    Review of Systems  Respiratory:  Positive for apnea.   Psychiatric/Behavioral:  Positive for sleep disturbance.   All other systems reviewed and are negative.   Vitals:   12/02/22 1507  BP: 136/62  Pulse: 71  Temp: (!) 97.5 F (36.4 C)  SpO2: 95%     Physical Exam Constitutional:  Appearance: He is obese.  HENT:     Head: Normocephalic.     Mouth/Throat:     Mouth: Mucous membranes are moist.     Comments: Mallampati 4, crowded oropharynx Eyes:     General:        Right eye: No discharge.        Left eye: No discharge.     Pupils: Pupils are equal, round, and reactive to light.  Cardiovascular:     Rate and Rhythm: Normal rate and regular rhythm.     Pulses: Normal pulses.     Heart sounds: Normal heart sounds. No murmur heard.    No friction rub.  Pulmonary:     Effort: Pulmonary effort is normal. No respiratory distress.     Breath sounds: Normal breath sounds. No stridor. No wheezing or rhonchi.  Musculoskeletal:     Cervical back: No rigidity or tenderness.  Neurological:     General: No focal deficit present.     Mental Status: He is alert.  Psychiatric:        Mood and Affect: Mood normal.       01/06/2020    3:00 PM  Results of the Epworth flowsheet  Sitting and reading 1  Watching TV 1  Sitting, inactive in a public place (e.g. a theatre or a meeting) 1  As a passenger in a car for an hour without a break 1  Lying down to rest in the afternoon when circumstances permit 1  Sitting and talking to someone 1  Sitting quietly after a lunch without alcohol 1  In a car, while stopped for a few minutes in traffic 0  Total score 7    Epworth Sleepiness Scale of 12 today  Compliance data reviewed showing excellent compliance at 93% AutoSet 5-15 Residual AHI of 2.3, 95 percentile pressure of 13.7   Assessment:  Moderate  obstructive sleep apnea   Compliance with CPAP  Benefiting from CPAP  No changes need made at present time   Plan/Recommendations: Continue CPAP  Continue weight loss efforts  I will see you a year from now  DME referral for CPAP supplies    Virl Diamond MD [facility] 12/02/2022, 3:27 PM  CC: Corwin Levins, MD

## 2022-12-02 NOTE — Patient Instructions (Signed)
I will see you a year from now  Referral to medical supply company for CPAP supplies  Call with significant concerns  Continue using your CPAP nightly  The download from the machine shows it is working well

## 2023-01-26 ENCOUNTER — Ambulatory Visit (INDEPENDENT_AMBULATORY_CARE_PROVIDER_SITE_OTHER): Payer: Medicare Other | Admitting: Internal Medicine

## 2023-01-26 VITALS — BP 138/80 | HR 83 | Temp 98.5°F | Ht 71.0 in | Wt 290.2 lb

## 2023-01-26 DIAGNOSIS — T7840XA Allergy, unspecified, initial encounter: Secondary | ICD-10-CM | POA: Diagnosis not present

## 2023-01-26 DIAGNOSIS — J309 Allergic rhinitis, unspecified: Secondary | ICD-10-CM | POA: Diagnosis not present

## 2023-01-26 DIAGNOSIS — E559 Vitamin D deficiency, unspecified: Secondary | ICD-10-CM

## 2023-01-26 DIAGNOSIS — R739 Hyperglycemia, unspecified: Secondary | ICD-10-CM

## 2023-01-26 DIAGNOSIS — I10 Essential (primary) hypertension: Secondary | ICD-10-CM

## 2023-01-26 MED ORDER — METHYLPREDNISOLONE ACETATE 80 MG/ML IJ SUSP
80.0000 mg | Freq: Once | INTRAMUSCULAR | Status: AC
  Administered 2023-01-26: 80 mg via INTRAMUSCULAR

## 2023-01-26 MED ORDER — PREDNISONE 10 MG PO TABS: ORAL_TABLET | ORAL | 0 refills | Status: DC

## 2023-01-26 NOTE — Progress Notes (Signed)
Patient ID: Joshua Min Sr., male   DOB: 1950-09-10, 72 y.o.   MRN: [id number]        Chief Complaint: follow up ear fullness and itching, allergies, chronic right ankle pain, hyperglycemia, htn       HPI:  Joshua Min Sr. is a 72 y.o. male Does have several wks ongoing nasal allergy symptoms with clearish congestion, itch and sneezing, without fever, pain, ST, cough, swelling or wheezing., but does have bilateral ear fullness and popping crackling.  Pt denies chest pain, increased sob or doe, wheezing, orthopnea, PND, increased LE swelling, palpitations, dizziness or syncope.   Pt denies polydipsia, polyuria, or new focal neuro s/s.   Has chronic right ankle pain with MRI tomorrow and f/u ortho after.  No falls.         Wt Readings from Last 3 Encounters:  01/26/23 290 lb 4 oz (131.7 kg)  12/02/22 289 lb 9.6 oz (131.4 kg)  10/28/22 285 lb (129.3 kg)   BP Readings from Last 3 Encounters:  01/26/23 138/80  12/02/22 136/62  10/28/22 136/74         Past Medical History:  Diagnosis Date   Acute pharyngitis 01/26/2009   ALLERGIC RHINITIS 08/07/2007   Allergy    ARTHRITIS 08/07/2007   BACK PAIN 09/15/2009   BENIGN PROSTATIC HYPERTROPHY 05/20/2008   Cataract    removed bilaterally    EATING DISORDER, HX OF 08/07/2007   ERECTILE DYSFUNCTION 02/18/2008   GERD (gastroesophageal reflux disease)    HYPERLIPIDEMIA 08/07/2007   HYPERTENSION 08/07/2007   NECK PAIN, RIGHT 09/15/2009   Neuromuscular disorder (HCC)    carpal tunnel    OSTEOARTHRITIS, KNEES, BILATERAL 09/15/2009   SINUSITIS- ACUTE-NOS 04/30/2010   Sleep apnea    weas cpap    Past Surgical History:  Procedure Laterality Date   COLONOSCOPY     GASTRIC BYPASS  2006   Left foot surgery  2008   Lower back  2004   POLYPECTOMY     Right knee arthroscopy  1994   UPPER GASTROINTESTINAL ENDOSCOPY      reports that he has never smoked. He has never used smokeless tobacco. He reports current alcohol use. He reports that he does  not use drugs. family history includes Heart disease in his father; Hyperlipidemia in his father; Hypertension in his father. Allergies  Allergen Reactions   Ace Inhibitors     REACTION: tongue swelling   Current Outpatient Medications on File Prior to Visit  Medication Sig Dispense Refill   acetaminophen (TYLENOL 8 HOUR) 650 MG CR tablet 4 tablets twice a day by oral route.     amLODipine (NORVASC) 5 MG tablet Take 1 tablet (5 mg total) by mouth daily. 90 tablet 3   atorvastatin (LIPITOR) 40 MG tablet Take 1 tablet (40 mg total) by mouth daily. 90 tablet 3   Cholecalciferol (VITAMIN D3 PO) Take 5,000 Int'l Units by mouth.     Cholecalciferol 250 MCG (10000 UT) TABS      cyclobenzaprine (FLEXERIL) 5 MG tablet Take 1 tablet (5 mg total) by mouth 3 (three) times daily as needed for muscle spasms. 40 tablet 2   desloratadine (CLARINEX) 5 MG tablet TAKE 1 TABLET BY MOUTH EVERY DAY 90 tablet 3   doxycycline (VIBRA-TABS) 100 MG tablet Take 1 tablet (100 mg total) by mouth 2 (two) times daily. 20 tablet 0   FISH OIL-KRILL OIL PO 500 mg.     furosemide (LASIX) 20 MG tablet 1 tab by  mouth in the AM, and 1 in the PM as needed for persistent swelling 180 tablet 3   irbesartan (AVAPRO) 150 MG tablet Take 1 tablet (150 mg total) by mouth daily. 90 tablet 3   iron polysaccharides (NIFEREX) 150 MG capsule TAKE 2 CAPSULE(150 MG) BY MOUTH DAILY 180 capsule 3   Pediatric Multivit-Minerals (FLINTSTONES COMPLETE PO) Take by mouth.     Probiotic Product (PROBIOTIC DAILY PO) Take by mouth. [employer] colon health     tadalafil (CIALIS) 20 MG tablet Take 1 tablet (20 mg total) by mouth daily as needed. 10 tablet 11   tamsulosin (FLOMAX) 0.4 MG CAPS capsule Take 2 capsules (0.8 mg total) by mouth daily. 90 capsule 3   tiZANidine (ZANAFLEX) 2 MG tablet TAKE 1 TABLET BY MOUTH EVERYDAY AT BEDTIME 90 tablet 1   traMADol (ULTRAM) 50 MG tablet TAKE 1 TABLET BY MOUTH EVERY 6 HOURS AS NEEDED FOR MODERATE PAIN 120 tablet 5    Turmeric 1053 MG TABS turmeric     No current facility-administered medications on file prior to visit.        ROS:  All others reviewed and negative.  Objective        PE:  BP 138/80   Pulse 83   Temp 98.5 F (36.9 C) (Temporal)   Ht 5\' 11"  (1.803 m)   Wt 290 lb 4 oz (131.7 kg)   SpO2 95%   BMI 40.48 kg/m                 Constitutional: Pt appears in NAD               HENT: Head: NCAT.                Right Ear: External ear normal.                 Left Ear: External ear normal. Bilat tm's with mild erythema.  Max sinus areas non tender.  Pharynx with mild erythema, no exudate               Eyes: . Pupils are equal, round, and reactive to light. Conjunctivae and EOM are normal               Nose: without d/c or deformity               Neck: Neck supple. Gross normal ROM               Cardiovascular: Normal rate and regular rhythm.                 Pulmonary/Chest: Effort normal and breath sounds without rales or wheezing.                Abd:  Soft, NT, ND, + BS, no organomegaly               Neurological: Pt is alert. At baseline orientation, motor grossly intact               Skin: Skin is warm. No rashes, no other new lesions, LE edema - none               Psychiatric: Pt behavior is normal without agitation   Micro: none  Cardiac tracings I have personally interpreted today:  none  Pertinent Radiological findings (summarize): none   Lab Results  Component Value Date   WBC 7.2 10/14/2022   HGB 14.6 10/14/2022   HCT 44.6 10/14/2022  PLT 198.0 10/14/2022   GLUCOSE 91 06/28/2022   CHOL 135 06/28/2022   TRIG 91.0 06/28/2022   HDL 52.40 06/28/2022   LDLDIRECT 157.6 01/02/2009   LDLCALC 64 06/28/2022   ALT 20 06/28/2022   AST 19 06/28/2022   NA 139 06/28/2022   K 4.3 06/28/2022   CL 103 06/28/2022   CREATININE 0.78 06/28/2022   BUN 16 06/28/2022   CO2 30 06/28/2022   TSH 0.61 06/28/2022   PSA 2.14 06/28/2022   HGBA1C 6.0 06/28/2022   MICROALBUR <0.7  06/28/2022   Assessment/Plan:  Joshua Min Sr. is a 72 y.o. Black or African American [2] male with  has a past medical history of Acute pharyngitis (01/26/2009), ALLERGIC RHINITIS (08/07/2007), Allergy, ARTHRITIS (08/07/2007), BACK PAIN (09/15/2009), BENIGN PROSTATIC HYPERTROPHY (05/20/2008), Cataract, EATING DISORDER, HX OF (08/07/2007), ERECTILE DYSFUNCTION (02/18/2008), GERD (gastroesophageal reflux disease), HYPERLIPIDEMIA (08/07/2007), HYPERTENSION (08/07/2007), NECK PAIN, RIGHT (09/15/2009), Neuromuscular disorder (HCC), OSTEOARTHRITIS, KNEES, BILATERAL (09/15/2009), SINUSITIS- ACUTE-NOS (04/30/2010), and Sleep apnea.  Vitamin D deficiency Last vitamin D Lab Results  Component Value Date   VD25OH 46.34 06/28/2022   Stable, cont oral replacement   Hyperglycemia Lab Results  Component Value Date   HGBA1C 6.0 06/28/2022   Stable, pt to continue current medical treatment  - diet, wt control   Essential hypertension BP Readings from Last 3 Encounters:  01/26/23 138/80  12/02/22 136/62  10/28/22 136/74   Stable, pt to continue medical treatment norvasc 5 every day, avapro 150 qd   Allergic rhinitis Mild to mod, for depomedrol 80 mg IM, prednisone taper, and zyrtec nasacort asd prn,  to f/u any worsening symptoms or concerns  Followup: Return in about 6 months (around 07/26/2023).  Oliver Barre, MD 01/28/2023 7:12 PM [facility] - [facility]

## 2023-01-26 NOTE — Patient Instructions (Addendum)
You had the steroid shot today  Please take all new medication as prescribed - the prednisone  Please also take OTC Zyrtec 10 mg per day, and Nasacort as needed  Please continue all other medications as before, and refills have been done if requested.  Please have the pharmacy call with any other refills you may need.  Please keep your appointments with your specialists as you may have planned  Please make an Appointment to return in 6 months, or sooner if needed

## 2023-01-28 ENCOUNTER — Encounter: Payer: Self-pay | Admitting: Internal Medicine

## 2023-01-28 NOTE — Assessment & Plan Note (Signed)
Lab Results  Component Value Date   HGBA1C 6.0 06/28/2022   Stable, pt to continue current medical treatment  - diet, wt control

## 2023-01-28 NOTE — Assessment & Plan Note (Signed)
Mild to mod, for depomedrol 80 mg IM, prednisone taper, and zyrtec nasacort asd prn,  to f/u any worsening symptoms or concerns

## 2023-01-28 NOTE — Assessment & Plan Note (Signed)
BP Readings from Last 3 Encounters:  01/26/23 138/80  12/02/22 136/62  10/28/22 136/74   Stable, pt to continue medical treatment norvasc 5 every day, avapro 150 qd

## 2023-01-28 NOTE — Assessment & Plan Note (Signed)
Last vitamin D Lab Results  Component Value Date   VD25OH 46.34 06/28/2022   Stable, cont oral replacement

## 2023-01-30 ENCOUNTER — Ambulatory Visit (INDEPENDENT_AMBULATORY_CARE_PROVIDER_SITE_OTHER): Payer: Medicare Other

## 2023-01-30 VITALS — Ht 71.0 in | Wt 280.0 lb

## 2023-01-30 DIAGNOSIS — Z0000 Encounter for general adult medical examination without abnormal findings: Secondary | ICD-10-CM | POA: Diagnosis not present

## 2023-01-30 NOTE — Patient Instructions (Signed)
Joshua Burnett , Thank you for taking time to come for your Medicare Wellness Visit. I appreciate your ongoing commitment to your health goals. Please review the following plan we discussed and let me know if I can assist you in the future.   Referrals/Orders/Follow-Ups/Clinician Recommendations: It was nice talking to you today.  Keep up the good work.  Each day, aim for 6 glasses of water, plenty of protein in your diet and try to get up and walk/ stretch every hour for 5-10 minutes at a time.    This is a list of the screening recommended for you and due dates:  Health Maintenance  Topic Date Due   COVID-19 Vaccine (6 - 2023-24 season) 02/15/2023*   DTaP/Tdap/Td vaccine (2 - Td or Tdap) 01/30/2024*   Colon Cancer Screening  07/01/2023   Medicare Annual Wellness Visit  01/30/2024   Pneumonia Vaccine  Completed   Flu Shot  Completed   Hepatitis C Screening  Completed   Zoster (Shingles) Vaccine  Completed   HPV Vaccine  Aged Out  *Topic was postponed. The date shown is not the original due date.    Advanced directives: (Copy Requested) Please bring a copy of your health care power of attorney and living will to the office to be added to your chart at your convenience.  Next Medicare Annual Wellness Visit scheduled for next year: Yes

## 2023-01-30 NOTE — Progress Notes (Addendum)
Subjective:   Joshua Min Sr. is a 72 y.o. male who presents for Medicare Annual/Subsequent preventive examination.  Visit Complete: Virtual I connected with  Joshua Min Sr. on 02/07/23 by a audio enabled telemedicine application and verified that I am speaking with the correct person using two identifiers.  Patient Location: Home  Provider Location: Office/Clinic  I discussed the limitations of evaluation and management by telemedicine. The patient expressed understanding and agreed to proceed.  Vital Signs: Because this visit was a virtual/telehealth visit, some criteria may be missing or patient reported. Any vitals not documented were not able to be obtained and vitals that have been documented are patient reported.   Cardiac Risk Factors include: advanced age (>24men, >28 women);hypertension;male gender;Other (see comment);dyslipidemia, Risk factor comments: OSA, BPH     Objective:    Today's Vitals   01/30/23 1524  Weight: 280 lb (127 kg)  Height: 5\' 11"  (1.803 m)   Body mass index is 39.05 kg/m.     01/30/2023    3:28 PM 01/26/2022    3:53 PM 09/28/2016    3:51 PM  Advanced Directives  Does Patient Have a Medical Advance Directive? Yes Yes Yes  Type of Estate agent of [place];Living will Healthcare Power of [place];Living will Healthcare Power of [place];Living will  Does patient want to make changes to medical advance directive?  No - Patient declined   Copy of Healthcare Power of Attorney in Chart? No - copy requested No - copy requested     Current Medications (verified) Outpatient Encounter Medications as of 01/30/2023  Medication Sig   acetaminophen (TYLENOL 8 HOUR) 650 MG CR tablet 4 tablets twice a day by oral route.   amLODipine (NORVASC) 5 MG tablet Take 1 tablet (5 mg total) by mouth daily.   atorvastatin (LIPITOR) 40 MG tablet Take 1 tablet (40 mg total) by mouth daily.   Cholecalciferol (VITAMIN D3 PO) Take  5,000 Int'l Units by mouth.   Cholecalciferol 250 MCG (10000 UT) TABS    cyclobenzaprine (FLEXERIL) 5 MG tablet Take 1 tablet (5 mg total) by mouth 3 (three) times daily as needed for muscle spasms.   desloratadine (CLARINEX) 5 MG tablet TAKE 1 TABLET BY MOUTH EVERY DAY   doxycycline (VIBRA-TABS) 100 MG tablet Take 1 tablet (100 mg total) by mouth 2 (two) times daily.   FISH OIL-KRILL OIL PO 500 mg.   furosemide (LASIX) 20 MG tablet 1 tab by mouth in the AM, and 1 in the PM as needed for persistent swelling   irbesartan (AVAPRO) 150 MG tablet Take 1 tablet (150 mg total) by mouth daily.   iron polysaccharides (NIFEREX) 150 MG capsule TAKE 2 CAPSULE(150 MG) BY MOUTH DAILY   Pediatric Multivit-Minerals (FLINTSTONES COMPLETE PO) Take by mouth.   predniSONE (DELTASONE) 10 MG tablet 3 tabs by mouth per day for 3 days,2tabs per day for 3 days,1tab per day for 3 days   Probiotic Product (PROBIOTIC DAILY PO) Take by mouth. [facility] colon health   tadalafil (CIALIS) 20 MG tablet Take 1 tablet (20 mg total) by mouth daily as needed.   tamsulosin (FLOMAX) 0.4 MG CAPS capsule Take 2 capsules (0.8 mg total) by mouth daily.   tiZANidine (ZANAFLEX) 2 MG tablet TAKE 1 TABLET BY MOUTH EVERYDAY AT BEDTIME   traMADol (ULTRAM) 50 MG tablet TAKE 1 TABLET BY MOUTH EVERY 6 HOURS AS NEEDED FOR MODERATE PAIN   Turmeric 1053 MG TABS turmeric   No facility-administered encounter medications  on file as of 01/30/2023.    Allergies (verified) Ace inhibitors   History: Past Medical History:  Diagnosis Date   Acute pharyngitis 01/26/2009   ALLERGIC RHINITIS 08/07/2007   Allergy    ARTHRITIS 08/07/2007   BACK PAIN 09/15/2009   BENIGN PROSTATIC HYPERTROPHY 05/20/2008   Cataract    removed bilaterally    EATING DISORDER, HX OF 08/07/2007   ERECTILE DYSFUNCTION 02/18/2008   GERD (gastroesophageal reflux disease)    HYPERLIPIDEMIA 08/07/2007   HYPERTENSION 08/07/2007   NECK PAIN, RIGHT 09/15/2009   Neuromuscular disorder  (HCC)    carpal tunnel    OSTEOARTHRITIS, KNEES, BILATERAL 09/15/2009   SINUSITIS- ACUTE-NOS 04/30/2010   Sleep apnea    weas cpap    Past Surgical History:  Procedure Laterality Date   COLONOSCOPY     GASTRIC BYPASS  2006   Left foot surgery  2008   Lower back  2004   POLYPECTOMY     Right knee arthroscopy  1994   UPPER GASTROINTESTINAL ENDOSCOPY     Family History  Problem Relation Age of Onset   Hyperlipidemia Father    Heart disease Father    Hypertension Father    Colon polyps Neg Hx    Esophageal cancer Neg Hx    Rectal cancer Neg Hx    Stomach cancer Neg Hx    Pancreatic cancer Neg Hx    Colon cancer Neg Hx    Social History   Socioeconomic History   Marital status: Married    Spouse name: Gavin Pound   Number of children: 2   Years of education: college   Highest education level: Not on file  Occupational History   Occupation: Event organiser  Tobacco Use   Smoking status: Never   Smokeless tobacco: Never  Vaping Use   Vaping status: Never Used  Substance and Sexual Activity   Alcohol use: Yes    Comment: occasional beer    Drug use: No   Sexual activity: Not Currently  Other Topics Concern   Not on file  Social History Narrative   Lives with wife   Social Determinants of Health   Financial Resource Strain: Low Risk  (01/30/2023)   Overall Financial Resource Strain (CARDIA)    Difficulty of Paying Living Expenses: Not hard at all  Food Insecurity: No Food Insecurity (01/30/2023)   Hunger Vital Sign    Worried About Radiation protection practitioner of Food in the Last Year: Never true    Ran Out of Food in the Last Year: Never true  Transportation Needs: No Transportation Needs (01/30/2023)   PRAPARE - Administrator, Civil Service (Medical): No    Lack of Transportation (Non-Medical): No  Physical Activity: Insufficiently Active (01/30/2023)   Exercise Vital Sign    Days of Exercise per Week: 4 days    Minutes of Exercise per Session: 20 min  Stress:  No Stress Concern Present (01/30/2023)   [employer] of Occupational Health - Occupational Stress Questionnaire    Feeling of Stress : Not at all  Social Connections: Socially Integrated (01/30/2023)   Social Connection and Isolation Panel [NHANES]    Frequency of Communication with Friends and Family: Three times a week    Frequency of Social Gatherings with Friends and Family: More than three times a week    Attends Religious Services: More than 4 times per year    Active Member of Clubs or Organizations: Yes    Attends Banker Meetings: More than 4  times per year    Marital Status: Married    Tobacco Counseling Counseling given: Not Answered   Clinical Intake:  Pre-visit preparation completed: Yes  Pain : No/denies pain     BMI - recorded: 39.05 Nutritional Status: BMI > 30  Obese Nutritional Risks: None Diabetes: No  How often do you need to have someone help you when you read instructions, pamphlets, or other written materials from your doctor or pharmacy?: 1 - Never  Interpreter Needed?: No  Information entered by :: Kenslee Achorn, RMA   Activities of Daily Living    01/30/2023    3:18 PM  In your present state of health, do you have any difficulty performing the following activities:  Hearing? 0  Vision? 0  Difficulty concentrating or making decisions? 0  Walking or climbing stairs? 0  Dressing or bathing? 0  Doing errands, shopping? 0  Preparing Food and eating ? N  Using the Toilet? N  In the past six months, have you accidently leaked urine? Y  Do you have problems with loss of bowel control? N  Managing your Medications? N  Managing your Finances? N  Housekeeping or managing your Housekeeping? N    Patient Care Team: Corwin Levins, MD as PCP - General  Indicate any recent Medical Services you may have received from other than [facility] providers in the past year (date may be approximate).     Assessment:   This is a routine  wellness examination for [employer].  Hearing/Vision screen Hearing Screening - Comments:: Denies hearing difficulties   Vision Screening - Comments:: Denies vision issues   Goals Addressed             This Visit's Progress    Patient Stated   On track    I want to decrease the amount of bread and sweets I eat.      Depression Screen    01/30/2023    3:35 PM 01/26/2023    8:51 AM 10/28/2022   10:19 AM 06/28/2022    3:11 PM 04/28/2022    1:34 PM 01/26/2022    3:49 PM 12/23/2021    9:38 AM  PHQ 2/9 Scores  PHQ - 2 Score 0 0 0 0 0 0 0  PHQ- 9 Score 1    0  0    Fall Risk    01/30/2023    3:29 PM 01/26/2023    8:51 AM 10/28/2022   10:19 AM 06/28/2022    3:10 PM 04/28/2022    1:34 PM  Fall Risk   Falls in the past year? 0 0 0 0 0  Number falls in past yr: 0 0 0 0 0  Injury with Fall? 0 0 0 0 0  Risk for fall due to : No Fall Risks;History of fall(s) No Fall Risks No Fall Risks No Fall Risks No Fall Risks  Follow up Falls prevention discussed Falls evaluation completed Falls evaluation completed Falls evaluation completed Falls evaluation completed    MEDICARE RISK AT HOME: Medicare Risk at Home Any stairs in or around the home?: Yes If so, are there any without handrails?: Yes Home free of loose throw rugs in walkways, pet beds, electrical cords, etc?: Yes Adequate lighting in your home to reduce risk of falls?: Yes Life alert?: No Use of a cane, walker or w/c?: No Grab bars in the bathroom?: No Shower chair or bench in shower?: Yes Elevated toilet seat or a handicapped toilet?: Yes  TIMED UP AND GO:  Was the test performed?  No    Cognitive Function:        01/30/2023    3:20 PM 01/26/2022    3:54 PM  6CIT Screen  What Year? 0 points 0 points  What month? 0 points 0 points  What time? 0 points 0 points  Count back from 20 0 points 0 points  Months in reverse 0 points 0 points  Repeat phrase 0 points 0 points  Total Score 0 points 0 points     Immunizations Immunization History  Administered Date(s) Administered   Fluad Quad(high Dose 65+) 11/15/2018, 12/30/2019   Influenza Split 12/21/2010, 12/22/2011   Influenza Whole 01/02/2009, 12/18/2009   Influenza,inj,Quad PF,6+ Mos 12/28/2012, 01/09/2015, 01/26/2016   Influenza-Unspecified 11/12/2016, 12/13/2017, 12/12/2020, 12/04/2021   PFIZER Comirnaty(Gray Top)Covid-19 Tri-Sucrose Vaccine 06/13/2020   PFIZER(Purple Top)SARS-COV-2 Vaccination 04/12/2019, 05/07/2019, 12/05/2019   Pfizer Covid-19 Vaccine Bivalent Booster 26yrs & up 12/22/2020   Pfizer(Comirnaty)Fall Seasonal Vaccine 12 years and older 12/04/2021   Pneumococcal Conjugate-13 08/30/2016   Pneumococcal Polysaccharide-23 10/04/2017   Tdap 12/21/2010   Unspecified SARS-COV-2 Vaccination 12/04/2021   Zoster Recombinant(Shingrix) 04/25/2017, 09/24/2017, 01/11/2019   Zoster, Live 12/28/2012    TDAP status: Due, Education has been provided regarding the importance of this vaccine. Advised may receive this vaccine at local pharmacy or Health Dept. Aware to provide a copy of the vaccination record if obtained from local pharmacy or Health Dept. Verbalized acceptance and understanding.  Flu Vaccine status: Up to date  Pneumococcal vaccine status: Up to date  Covid-19 vaccine status: Information provided on how to obtain vaccines.   Qualifies for Shingles Vaccine? Yes   Zostavax completed Yes   Shingrix Completed?: Yes  Screening Tests Health Maintenance  Topic Date Due   COVID-19 Vaccine (6 - 2023-24 season) 02/15/2023 (Originally 11/06/2022)   DTaP/Tdap/Td (2 - Td or Tdap) 01/30/2024 (Originally 12/20/2020)   Colonoscopy  07/01/2023   Medicare Annual Wellness (AWV)  01/30/2024   Pneumonia Vaccine 74+ Years old  Completed   INFLUENZA VACCINE  Completed   Hepatitis C Screening  Completed   Zoster Vaccines- Shingrix  Completed   HPV VACCINES  Aged Out    Health Maintenance  There are no preventive care  reminders to display for this patient.   Colorectal cancer screening: Type of screening: Colonoscopy. Completed 06/30/2020. Repeat every 3 years  Lung Cancer Screening: (Low Dose CT Chest recommended if Age 36-80 years, 20 pack-year currently smoking OR have quit w/in 15years.) does not qualify.   Lung Cancer Screening Referral: NA  Additional Screening:  Hepatitis C Screening: does qualify; Completed 01/09/2015  Vision Screening: Recommended annual ophthalmology exams for early detection of glaucoma and other disorders of the eye. Is the patient up to date with their annual eye exam?  Yes  Who is the provider or what is the name of the office in which the patient attends annual eye exams? Dr. Burgess Estelle If pt is not established with a provider, would they like to be referred to a provider to establish care? No .   Dental Screening: Recommended annual dental exams for proper oral hygiene   Community Resource Referral / Chronic Care Management: CRR required this visit?  No   CCM required this visit?  No     Plan:     I have personally reviewed and noted the following in the patient's chart:   Medical and social history Use of alcohol, tobacco or illicit drugs  Current medications and supplements including opioid prescriptions. Patient  is not currently taking opioid prescriptions. Functional ability and status Nutritional status Physical activity Advanced directives List of other physicians Hospitalizations, surgeries, and ER visits in previous 12 months Vitals Screenings to include cognitive, depression, and falls Referrals and appointments  In addition, I have reviewed and discussed with patient certain preventive protocols, quality metrics, and best practice recommendations. A written personalized care plan for preventive services as well as general preventive health recommendations were provided to patient.     Doreather Hoxworth L Makaelah Cranfield, CMA   02/07/2023   After Visit Summary:  (MyChart) Due to this being a telephonic visit, the after visit summary with patients personalized plan was offered to patient via MyChart   Nurse Notes: Patient is due for a Tdap, however he declines it for now.  He is up to date on all other health maintenance.  Patient had no concerns to address today.

## 2023-02-24 ENCOUNTER — Other Ambulatory Visit: Payer: Self-pay | Admitting: Internal Medicine

## 2023-03-03 ENCOUNTER — Other Ambulatory Visit: Payer: Self-pay | Admitting: Internal Medicine

## 2023-03-10 ENCOUNTER — Telehealth: Payer: Self-pay | Admitting: Internal Medicine

## 2023-03-10 ENCOUNTER — Other Ambulatory Visit: Payer: Self-pay

## 2023-03-10 MED ORDER — AMLODIPINE BESYLATE 5 MG PO TABS: 5.0000 mg | ORAL_TABLET | Freq: Every day | ORAL | 3 refills | Status: DC

## 2023-03-10 NOTE — Telephone Encounter (Unsigned)
 Copied from CRM [id number]. Topic: Clinical - Prescription Issue >> Mar 10, 2023 11:29 AM Rolin D wrote: Reason for CRM: Patient called in stated that he has called over to his preferred pharmacy [facility] [phone] - [place], [place] - [phone] to get amLODipine  (NORVASC ) 5 MG tablet. Pharmacy stated that they have sent a request to Dr. Norleen . Patient has 3 more refills left and doesn't understand why he can't get medication refilled . Patient has took the last of this medication today .

## 2023-03-10 NOTE — Telephone Encounter (Signed)
Refill sent again

## 2023-04-06 ENCOUNTER — Telehealth: Payer: Self-pay

## 2023-04-06 DIAGNOSIS — D509 Iron deficiency anemia, unspecified: Secondary | ICD-10-CM

## 2023-04-06 NOTE — Telephone Encounter (Signed)
-----   Message from [facility] Marylu Lund H sent at 10/17/2022  8:35 AM EDT ----- Regarding: due for labs in Feb Patient due for albs in Feb :  CBC and TIBC / ferritin

## 2023-04-06 NOTE — Telephone Encounter (Signed)
Lab orders placed. MyChart message to patient to go to the lab one day in the next week or two

## 2023-04-10 ENCOUNTER — Other Ambulatory Visit (INDEPENDENT_AMBULATORY_CARE_PROVIDER_SITE_OTHER): Payer: Medicare Other

## 2023-04-10 DIAGNOSIS — D509 Iron deficiency anemia, unspecified: Secondary | ICD-10-CM

## 2023-04-10 LAB — CBC WITH DIFFERENTIAL/PLATELET
Basophils Absolute: 0 10*3/uL (ref 0.0–0.1)
Basophils Relative: 0.3 % (ref 0.0–3.0)
Eosinophils Absolute: 0.2 10*3/uL (ref 0.0–0.7)
Eosinophils Relative: 3 % (ref 0.0–5.0)
HCT: 42.2 % (ref 39.0–52.0)
Hemoglobin: 14.2 g/dL (ref 13.0–17.0)
Lymphocytes Relative: 17.7 % (ref 12.0–46.0)
Lymphs Abs: 1.1 10*3/uL (ref 0.7–4.0)
MCHC: 33.5 g/dL (ref 30.0–36.0)
MCV: 91.8 fL (ref 78.0–100.0)
Monocytes Absolute: 0.7 10*3/uL (ref 0.1–1.0)
Monocytes Relative: 11 % (ref 3.0–12.0)
Neutro Abs: 4.4 10*3/uL (ref 1.4–7.7)
Neutrophils Relative %: 68 % (ref 43.0–77.0)
Platelets: 186 10*3/uL (ref 150.0–400.0)
RBC: 4.6 Mil/uL (ref 4.22–5.81)
RDW: 13.7 % (ref 11.5–15.5)
WBC: 6.5 10*3/uL (ref 4.0–10.5)

## 2023-04-10 LAB — IBC + FERRITIN
Ferritin: 16.9 ng/mL — ABNORMAL LOW (ref 22.0–322.0)
Iron: 68 ug/dL (ref 42–165)
Saturation Ratios: 21.3 % (ref 20.0–50.0)
TIBC: 319.2 ug/dL (ref 250.0–450.0)
Transferrin: 228 mg/dL (ref 212.0–360.0)

## 2023-04-11 ENCOUNTER — Encounter: Payer: Self-pay | Admitting: Gastroenterology

## 2023-04-20 ENCOUNTER — Encounter (HOSPITAL_BASED_OUTPATIENT_CLINIC_OR_DEPARTMENT_OTHER): Payer: Self-pay | Admitting: Orthopedic Surgery

## 2023-04-20 ENCOUNTER — Other Ambulatory Visit: Payer: Self-pay

## 2023-04-22 ENCOUNTER — Other Ambulatory Visit: Payer: Self-pay | Admitting: Internal Medicine

## 2023-04-24 ENCOUNTER — Other Ambulatory Visit: Payer: Self-pay | Admitting: Orthopedic Surgery

## 2023-04-25 ENCOUNTER — Encounter (HOSPITAL_BASED_OUTPATIENT_CLINIC_OR_DEPARTMENT_OTHER)
Admission: RE | Admit: 2023-04-25 | Discharge: 2023-04-25 | Disposition: A | Discharge status: Home / Self Care | Payer: Medicare Other | Source: Ambulatory Visit | Attending: Orthopedic Surgery | Admitting: Orthopedic Surgery

## 2023-04-25 DIAGNOSIS — Z01818 Encounter for other preprocedural examination: Secondary | ICD-10-CM | POA: Insufficient documentation

## 2023-04-25 DIAGNOSIS — D509 Iron deficiency anemia, unspecified: Secondary | ICD-10-CM | POA: Insufficient documentation

## 2023-04-25 LAB — BASIC METABOLIC PANEL
Anion gap: 9 (ref 5–15)
BUN: 15 mg/dL (ref 8–23)
CO2: 26 mmol/L (ref 22–32)
Calcium: 9 mg/dL (ref 8.9–10.3)
Chloride: 104 mmol/L (ref 98–111)
Creatinine, Ser: 0.96 mg/dL (ref 0.61–1.24)
GFR, Estimated: >60 mL/min (ref >60)
Glucose, Bld: 126 mg/dL — ABNORMAL HIGH (ref 70–99)
Potassium: 4.1 mmol/L (ref 3.5–5.1)
Sodium: 139 mmol/L (ref 135–145)

## 2023-04-25 NOTE — Progress Notes (Signed)

## 2023-04-26 NOTE — H&P (Cosign Needed)
 Loris Zada Finders Sr. is an 73 y.o. male.   Chief Complaint: Right ankle and hindfoot pain  HPI: Patient is a 73 year old male who presents to the OR today for definitive treatment for his right ankle and hindfoot pain. He has failed nonoperative treatment to date including activity modification, oral anti-inflammatories, bracing and shoewear modification for more than 3 months, and elects for surgical intervention.  Allergies:  Allergies  Allergen Reactions   Ace Inhibitors     REACTION: tongue swelling    Past Medical History:  Diagnosis Date   Acute pharyngitis 01/26/2009   ALLERGIC RHINITIS 08/07/2007   Allergy    ARTHRITIS 08/07/2007   BACK PAIN 09/15/2009   BENIGN PROSTATIC HYPERTROPHY 05/20/2008   Cataract    removed bilaterally    EATING DISORDER, HX OF 08/07/2007   ERECTILE DYSFUNCTION 02/18/2008   GERD (gastroesophageal reflux disease)    HYPERLIPIDEMIA 08/07/2007   HYPERTENSION 08/07/2007   NECK PAIN, RIGHT 09/15/2009   Neuromuscular disorder (HCC)    carpal tunnel    OSTEOARTHRITIS, KNEES, BILATERAL 09/15/2009   SINUSITIS- ACUTE-NOS 04/30/2010   Sleep apnea    weas cpap     Past Surgical History:  Procedure Laterality Date   COLONOSCOPY     GASTRIC BYPASS  2006   Left foot surgery  2008   Lower back  2004   POLYPECTOMY     Right knee arthroscopy  1994   UPPER GASTROINTESTINAL ENDOSCOPY      Family History: Family History  Problem Relation Age of Onset   Hyperlipidemia Father    Heart disease Father    Hypertension Father    Colon polyps Neg Hx    Esophageal cancer Neg Hx    Rectal cancer Neg Hx    Stomach cancer Neg Hx    Pancreatic cancer Neg Hx    Colon cancer Neg Hx     Social History:   reports that he has never smoked. He has never used smokeless tobacco. He reports current alcohol use. He reports that he does not use drugs.  Medications: No medications prior to admission.    Results for orders placed or performed during the  hospital encounter of 04/27/23 (from the past 48 hours)  Basic metabolic panel per protocol     Status: Abnormal   Collection Time: 04/25/23 10:27 AM  Result Value Ref Range   Sodium 139 135 - 145 mmol/L   Potassium 4.1 3.5 - 5.1 mmol/L   Chloride 104 98 - 111 mmol/L   CO2 26 22 - 32 mmol/L   Glucose, Bld 126 (H) 70 - 99 mg/dL    Comment: Glucose reference range applies only to samples taken after fasting for at least 8 hours.   BUN 15 8 - 23 mg/dL   Creatinine, Ser 1.61 0.61 - 1.24 mg/dL   Calcium 9.0 8.9 - 09.6 mg/dL   GFR, Estimated >04 >54 mL/min    Comment: (NOTE) Calculated using the CKD-EPI Creatinine Equation (2021)    Anion gap 9 5 - 15    Comment: Performed at [facility], [address] [place], [place] [address]    No results found.    Height 5\' 11"  (1.803 m), weight 128.4 kg.  PE:  well nourished and well developed.  NAD.  EOMI.  Resp unlabored.  Patient has collapse of longitudinal arch on the right.  Tender to palpation along the course of the posterior tibialis tendon and sinus tarsi.  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  The patient presents to the OR for elective right gastroc recession in conjunction with talonavicular and subtalar joint arthrodesis.  After reviewing the procedure, postoperative protocol, and risk of surgery the patient elects for surgical intervention.  The patient specifically understands risks of bleeding, infection, nerve damage, blood clots, need for additional surgery, continued pain, nonunion, post traumatic arthritis, recurrence of deformity, amputation and death.   Alfredo Martinez PA-C [facility] Office:  [phone]   No change to findings documented above.  The risks and benefits of the alternative treatment options have been discussed in detail.  The patient wishes to proceed with surgery and specifically understands risks of bleeding,  infection, nerve damage, blood clots, need for additional surgery, amputation and death.

## 2023-04-26 NOTE — Anesthesia Preprocedure Evaluation (Addendum)
 Anesthesia Evaluation  Patient identified by MRN, date of birth, ID band Patient awake    Reviewed: Allergy & Precautions, H&P , NPO status , Patient's Chart, lab work & pertinent test results  Airway Mallampati: II  TM Distance: >3 FB Neck ROM: Full    Dental no notable dental hx. (+) Teeth Intact, Dental Advisory Given, Caps   Pulmonary neg pulmonary ROS, shortness of breath, sleep apnea    Pulmonary exam normal breath sounds clear to auscultation       Cardiovascular Exercise Tolerance: Good hypertension, Pt. on medications negative cardio ROS Normal cardiovascular exam Rhythm:Regular Rate:Normal     Neuro/Psych  Neuromuscular disease negative neurological ROS  negative psych ROS   GI/Hepatic negative GI ROS, Neg liver ROS,GERD  ,,  Endo/Other  negative endocrine ROS    Renal/GU negative Renal ROS  negative genitourinary   Musculoskeletal negative musculoskeletal ROS (+)    Abdominal   Peds negative pediatric ROS (+)  Hematology negative hematology ROS (+) Blood dyscrasia, anemia   Anesthesia Other Findings   Reproductive/Obstetrics negative OB ROS                             Anesthesia Physical Anesthesia Plan  ASA: 2  Anesthesia Plan: General and Regional   Post-op Pain Management: Celebrex PO (pre-op)*, Tylenol PO (pre-op)* and Regional block*   Induction: Intravenous  PONV Risk Score and Plan: 2 and Ondansetron, Dexamethasone and Treatment may vary due to age or medical condition  Airway Management Planned: Oral ETT and LMA  Additional Equipment: None  Intra-op Plan:   Post-operative Plan: Extubation in OR  Informed Consent: I have reviewed the patients History and Physical, chart, labs and discussed the procedure including the risks, benefits and alternatives for the proposed anesthesia with the patient or authorized representative who has indicated his/her  understanding and acceptance.       Plan Discussed with: Anesthesiologist and CRNA  Anesthesia Plan Comments:         Anesthesia Quick Evaluation

## 2023-04-27 ENCOUNTER — Ambulatory Visit (HOSPITAL_BASED_OUTPATIENT_CLINIC_OR_DEPARTMENT_OTHER): Payer: Medicare Other

## 2023-04-27 ENCOUNTER — Ambulatory Visit (HOSPITAL_BASED_OUTPATIENT_CLINIC_OR_DEPARTMENT_OTHER): Payer: Medicare Other | Admitting: Anesthesiology

## 2023-04-27 ENCOUNTER — Ambulatory Visit (HOSPITAL_BASED_OUTPATIENT_CLINIC_OR_DEPARTMENT_OTHER)
Admission: RE | Admit: 2023-04-27 | Discharge: 2023-04-27 | Disposition: A | Discharge status: Home / Self Care | Payer: Medicare Other | Attending: Orthopedic Surgery | Admitting: Orthopedic Surgery

## 2023-04-27 ENCOUNTER — Encounter (HOSPITAL_BASED_OUTPATIENT_CLINIC_OR_DEPARTMENT_OTHER): Payer: Self-pay | Admitting: Orthopedic Surgery

## 2023-04-27 ENCOUNTER — Other Ambulatory Visit: Payer: Self-pay

## 2023-04-27 ENCOUNTER — Ambulatory Visit (HOSPITAL_BASED_OUTPATIENT_CLINIC_OR_DEPARTMENT_OTHER): Payer: Self-pay | Admitting: Anesthesiology

## 2023-04-27 ENCOUNTER — Encounter (HOSPITAL_BASED_OUTPATIENT_CLINIC_OR_DEPARTMENT_OTHER)
Admission: RE | Disposition: A | Discharge status: Home / Self Care | Payer: Self-pay | Source: Home / Self Care | Attending: Orthopedic Surgery

## 2023-04-27 DIAGNOSIS — M19071 Primary osteoarthritis, right ankle and foot: Secondary | ICD-10-CM

## 2023-04-27 DIAGNOSIS — I10 Essential (primary) hypertension: Secondary | ICD-10-CM | POA: Insufficient documentation

## 2023-04-27 DIAGNOSIS — M6701 Short Achilles tendon (acquired), right ankle: Secondary | ICD-10-CM | POA: Insufficient documentation

## 2023-04-27 DIAGNOSIS — G4730 Sleep apnea, unspecified: Secondary | ICD-10-CM | POA: Diagnosis not present

## 2023-04-27 DIAGNOSIS — Z01818 Encounter for other preprocedural examination: Secondary | ICD-10-CM

## 2023-04-27 DIAGNOSIS — K219 Gastro-esophageal reflux disease without esophagitis: Secondary | ICD-10-CM | POA: Diagnosis not present

## 2023-04-27 DIAGNOSIS — M76821 Posterior tibial tendinitis, right leg: Secondary | ICD-10-CM | POA: Diagnosis not present

## 2023-04-27 HISTORY — PX: GASTROCNEMIUS RECESSION: SHX863

## 2023-04-27 HISTORY — PX: ANKLE FUSION: SHX881

## 2023-04-27 SURGERY — ARTHRODESIS ANKLE
Anesthesia: Regional | Site: Leg Lower | Laterality: Right

## 2023-04-27 MED ORDER — PHENYLEPHRINE HCL-NACL 20-0.9 MG/250ML-% IV SOLN
INTRAVENOUS | Status: DC | PRN
  Administered 2023-04-27: 50 ug/min via INTRAVENOUS

## 2023-04-27 MED ORDER — MEPERIDINE HCL 25 MG/ML IJ SOLN: 6.2500 mg | INTRAMUSCULAR | Status: DC | PRN

## 2023-04-27 MED ORDER — ACETAMINOPHEN 160 MG/5ML PO SOLN: 325.0000 mg | ORAL | Status: DC | PRN

## 2023-04-27 MED ORDER — VANCOMYCIN HCL 500 MG IV SOLR
INTRAVENOUS | Status: AC
  Filled 2023-04-27: qty 10

## 2023-04-27 MED ORDER — CELECOXIB 200 MG PO CAPS
ORAL_CAPSULE | ORAL | Status: AC
  Filled 2023-04-27: qty 1

## 2023-04-27 MED ORDER — ACETAMINOPHEN 500 MG PO TABS
ORAL_TABLET | ORAL | Status: AC
  Filled 2023-04-27: qty 2

## 2023-04-27 MED ORDER — CEFAZOLIN SODIUM-DEXTROSE 2-4 GM/100ML-% IV SOLN
INTRAVENOUS | Status: AC
  Filled 2023-04-27: qty 100

## 2023-04-27 MED ORDER — PHENYLEPHRINE 80 MCG/ML (10ML) SYRINGE FOR IV PUSH (FOR BLOOD PRESSURE SUPPORT)
PREFILLED_SYRINGE | INTRAVENOUS | Status: AC
  Filled 2023-04-27: qty 10

## 2023-04-27 MED ORDER — SENNA 8.6 MG PO TABS: 2.0000 | ORAL_TABLET | Freq: Two times a day (BID) | ORAL | 0 refills | Status: AC

## 2023-04-27 MED ORDER — ROCURONIUM BROMIDE 10 MG/ML (PF) SYRINGE
PREFILLED_SYRINGE | INTRAVENOUS | Status: AC
  Filled 2023-04-27: qty 10

## 2023-04-27 MED ORDER — EPHEDRINE 5 MG/ML INJ
INTRAVENOUS | Status: AC
  Filled 2023-04-27: qty 5

## 2023-04-27 MED ORDER — BUPIVACAINE LIPOSOME 1.3 % IJ SUSP
INTRAMUSCULAR | Status: DC | PRN
Start: 2023-04-27 — End: 2023-04-27
  Administered 2023-04-27: 10 mL via PERINEURAL

## 2023-04-27 MED ORDER — ROCURONIUM BROMIDE 100 MG/10ML IV SOLN
INTRAVENOUS | Status: DC | PRN
  Administered 2023-04-27: 50 mg via INTRAVENOUS

## 2023-04-27 MED ORDER — EPHEDRINE SULFATE (PRESSORS) 50 MG/ML IJ SOLN
INTRAMUSCULAR | Status: DC | PRN
  Administered 2023-04-27: 10 mg via INTRAVENOUS

## 2023-04-27 MED ORDER — ONDANSETRON HCL 4 MG/2ML IJ SOLN: 4.0000 mg | Freq: Once | INTRAMUSCULAR | Status: DC | PRN

## 2023-04-27 MED ORDER — ONDANSETRON HCL 4 MG/2ML IJ SOLN
INTRAMUSCULAR | Status: AC
  Filled 2023-04-27: qty 2

## 2023-04-27 MED ORDER — ACETAMINOPHEN 325 MG PO TABS: 325.0000 mg | ORAL_TABLET | ORAL | Status: DC | PRN

## 2023-04-27 MED ORDER — LIDOCAINE 2% (20 MG/ML) 5 ML SYRINGE
INTRAMUSCULAR | Status: AC
  Filled 2023-04-27: qty 5

## 2023-04-27 MED ORDER — PROPOFOL 10 MG/ML IV BOLUS
INTRAVENOUS | Status: DC | PRN
  Administered 2023-04-27: 20 mg via INTRAVENOUS
  Administered 2023-04-27: 180 mg via INTRAVENOUS

## 2023-04-27 MED ORDER — PHENYLEPHRINE HCL (PRESSORS) 10 MG/ML IV SOLN
INTRAVENOUS | Status: DC | PRN
  Administered 2023-04-27 (×4): 160 ug via INTRAVENOUS
  Administered 2023-04-27: 240 ug via INTRAVENOUS

## 2023-04-27 MED ORDER — DEXAMETHASONE SODIUM PHOSPHATE 10 MG/ML IJ SOLN
INTRAMUSCULAR | Status: DC | PRN
  Administered 2023-04-27: 5 mg via INTRAVENOUS

## 2023-04-27 MED ORDER — DEXAMETHASONE SODIUM PHOSPHATE 10 MG/ML IJ SOLN
INTRAMUSCULAR | Status: AC
  Filled 2023-04-27: qty 1

## 2023-04-27 MED ORDER — LACTATED RINGERS IV SOLN: INTRAVENOUS | Status: DC

## 2023-04-27 MED ORDER — OXYCODONE HCL 5 MG PO TABS: 5.0000 mg | ORAL_TABLET | Freq: Once | ORAL | Status: DC | PRN

## 2023-04-27 MED ORDER — CEFAZOLIN SODIUM-DEXTROSE 2-4 GM/100ML-% IV SOLN
2.0000 g | INTRAVENOUS | Status: AC
  Administered 2023-04-27: 1 g via INTRAVENOUS
  Administered 2023-04-27: 2 g via INTRAVENOUS

## 2023-04-27 MED ORDER — OXYCODONE HCL 5 MG/5ML PO SOLN: 5.0000 mg | Freq: Once | ORAL | Status: DC | PRN

## 2023-04-27 MED ORDER — BUPIVACAINE HCL (PF) 0.5 % IJ SOLN
INTRAMUSCULAR | Status: DC | PRN
  Administered 2023-04-27: 15 mL via PERINEURAL

## 2023-04-27 MED ORDER — FENTANYL CITRATE (PF) 100 MCG/2ML IJ SOLN
INTRAMUSCULAR | Status: DC | PRN
  Administered 2023-04-27 (×2): 50 ug via INTRAVENOUS

## 2023-04-27 MED ORDER — FENTANYL CITRATE (PF) 100 MCG/2ML IJ SOLN
25.0000 ug | INTRAMUSCULAR | Status: DC | PRN
Start: 2023-04-27 — End: 2023-04-27

## 2023-04-27 MED ORDER — VANCOMYCIN HCL 500 MG IV SOLR
INTRAVENOUS | Status: DC | PRN
  Administered 2023-04-27: 500 mg

## 2023-04-27 MED ORDER — MIDAZOLAM HCL 2 MG/2ML IJ SOLN
INTRAMUSCULAR | Status: AC
Start: 2023-04-27 — End: ?
  Filled 2023-04-27: qty 2

## 2023-04-27 MED ORDER — PROPOFOL 10 MG/ML IV BOLUS
INTRAVENOUS | Status: AC
  Filled 2023-04-27: qty 20

## 2023-04-27 MED ORDER — RIVAROXABAN 10 MG PO TABS: 10.0000 mg | ORAL_TABLET | Freq: Every day | ORAL | 0 refills | Status: DC

## 2023-04-27 MED ORDER — FENTANYL CITRATE (PF) 100 MCG/2ML IJ SOLN
100.0000 ug | Freq: Once | INTRAMUSCULAR | Status: AC
  Administered 2023-04-27: 100 ug via INTRAVENOUS

## 2023-04-27 MED ORDER — 0.9 % SODIUM CHLORIDE (POUR BTL) OPTIME
TOPICAL | Status: DC | PRN
  Administered 2023-04-27: 1000 mL

## 2023-04-27 MED ORDER — FENTANYL CITRATE (PF) 100 MCG/2ML IJ SOLN
INTRAMUSCULAR | Status: AC
  Filled 2023-04-27: qty 2

## 2023-04-27 MED ORDER — SUGAMMADEX SODIUM 200 MG/2ML IV SOLN
INTRAVENOUS | Status: DC | PRN
  Administered 2023-04-27: 300 mg via INTRAVENOUS

## 2023-04-27 MED ORDER — DOCUSATE SODIUM 100 MG PO CAPS: 100.0000 mg | ORAL_CAPSULE | Freq: Two times a day (BID) | ORAL | 0 refills | Status: AC

## 2023-04-27 MED ORDER — OXYCODONE HCL 5 MG PO TABS: 5.0000 mg | ORAL_TABLET | Freq: Four times a day (QID) | ORAL | 0 refills | Status: AC | PRN

## 2023-04-27 MED ORDER — ONDANSETRON HCL 4 MG/2ML IJ SOLN
INTRAMUSCULAR | Status: DC | PRN
  Administered 2023-04-27: 4 mg via INTRAVENOUS

## 2023-04-27 MED ORDER — CELECOXIB 200 MG PO CAPS
200.0000 mg | ORAL_CAPSULE | Freq: Once | ORAL | Status: AC
Start: 2023-04-27 — End: 2023-04-27
  Administered 2023-04-27: 200 mg via ORAL

## 2023-04-27 MED ORDER — ACETAMINOPHEN 500 MG PO TABS
1000.0000 mg | ORAL_TABLET | Freq: Once | ORAL | Status: AC
  Administered 2023-04-27: 1000 mg via ORAL

## 2023-04-27 MED ORDER — MIDAZOLAM HCL 2 MG/2ML IJ SOLN
2.0000 mg | Freq: Once | INTRAMUSCULAR | Status: AC
  Administered 2023-04-27: 2 mg via INTRAVENOUS

## 2023-04-27 SURGICAL SUPPLY — 70 items
BANDAGE ESMARK 6X9 LF (GAUZE/BANDAGES/DRESSINGS) IMPLANT
BIT DRILL 4.8X300 (BIT) IMPLANT
BIT DRILL CANN 3.5MM (DRILL) IMPLANT
BLADE AVERAGE 25X9 (BLADE) IMPLANT
BLADE BONE MILL MEDIUM (MISCELLANEOUS) IMPLANT
BLADE MICRO SAGITTAL (BLADE) IMPLANT
BLADE SURG 15 STRL LF DISP TIS (BLADE) ×4 IMPLANT
BNDG ELASTIC 4INX 5YD STR LF (GAUZE/BANDAGES/DRESSINGS) ×2 IMPLANT
BNDG ELASTIC 6INX 5YD STR LF (GAUZE/BANDAGES/DRESSINGS) ×2 IMPLANT
BNDG ESMARK 6X9 LF (GAUZE/BANDAGES/DRESSINGS) IMPLANT
BOOT STEPPER DURA LG (SOFTGOODS) IMPLANT
BOOT STEPPER DURA MED (SOFTGOODS) IMPLANT
BOOT STEPPER DURA XLG (SOFTGOODS) IMPLANT
CHLORAPREP W/TINT 26 (MISCELLANEOUS) ×2 IMPLANT
COVER BACK TABLE 60X90IN (DRAPES) ×2 IMPLANT
COVER MAYO STAND STRL (DRAPES) IMPLANT
CUFF TRNQT CYL 34X4.125X (TOURNIQUET CUFF) IMPLANT
DRAPE EXTREMITY T 121X128X90 (DISPOSABLE) ×2 IMPLANT
DRAPE OEC MINIVIEW 54X84 (DRAPES) ×2 IMPLANT
DRAPE U-SHAPE 47X51 STRL (DRAPES) ×2 IMPLANT
DRAPE UTILITY XL STRL (DRAPES) IMPLANT
DRILL CANN 3.5MM (DRILL) ×2 IMPLANT
DRSG MEPITEL 4X7.2 (GAUZE/BANDAGES/DRESSINGS) ×2 IMPLANT
ELECT REM PT RETURN 9FT ADLT (ELECTROSURGICAL) ×2 IMPLANT
ELECTRODE REM PT RTRN 9FT ADLT (ELECTROSURGICAL) ×2 IMPLANT
GAUZE PAD ABD 8X10 STRL (GAUZE/BANDAGES/DRESSINGS) ×4 IMPLANT
GAUZE SPONGE 4X4 12PLY STRL (GAUZE/BANDAGES/DRESSINGS) ×2 IMPLANT
GLOVE BIO SURGEON STRL SZ8 (GLOVE) ×2 IMPLANT
GLOVE BIOGEL PI IND STRL 8 (GLOVE) ×4 IMPLANT
GLOVE ECLIPSE 8.0 STRL XLNG CF (GLOVE) ×2 IMPLANT
GOWN STRL REUS W/ TWL LRG LVL3 (GOWN DISPOSABLE) ×2 IMPLANT
GOWN STRL REUS W/ TWL XL LVL3 (GOWN DISPOSABLE) ×4 IMPLANT
K-WIRE FX200X1.8XTHRD TROC (WIRE) ×4 IMPLANT
KIT ASP BONE MRW 60CC (KITS) IMPLANT
KWIRE FX200X1.8XTHRD TROC (WIRE) IMPLANT
MAT PREVALON FULL STRYKER (MISCELLANEOUS) IMPLANT
MILL BONE PREP (MISCELLANEOUS) IMPLANT
NDL HYPO 22X1.5 SAFETY MO (MISCELLANEOUS) IMPLANT
NDL HYPO 25X1 1.5 SAFETY (NEEDLE) IMPLANT
NEEDLE HYPO 22X1.5 SAFETY MO (MISCELLANEOUS) IMPLANT
NEEDLE HYPO 25X1 1.5 SAFETY (NEEDLE) IMPLANT
NS IRRIG 1000ML POUR BTL (IV SOLUTION) ×2 IMPLANT
PACK BASIN DAY SURGERY FS (CUSTOM PROCEDURE TRAY) ×2 IMPLANT
PAD CAST 4YDX4 CTTN HI CHSV (CAST SUPPLIES) ×2 IMPLANT
PADDING CAST COTTON 6X4 STRL (CAST SUPPLIES) ×2 IMPLANT
PENCIL SMOKE EVACUATOR (MISCELLANEOUS) ×2 IMPLANT
PIN GUIDE DRIL TIP 2.8X300 STE (PIN) IMPLANT
SANITIZER HAND PURELL FF 515ML (MISCELLANEOUS) ×2 IMPLANT
SCREW 8.0X80MMX16 (Screw) IMPLANT
SCREW CANN 5.0X40MM (Screw) IMPLANT
SCREW CANNFT 6.5X60 (Screw) IMPLANT
SHEET MEDIUM DRAPE 40X70 STRL (DRAPES) ×2 IMPLANT
SLEEVE SCD COMPRESS KNEE MED (STOCKING) ×2 IMPLANT
SPIKE FLUID TRANSFER (MISCELLANEOUS) IMPLANT
SPLINT PLASTER CAST FAST 5X30 (CAST SUPPLIES) ×40 IMPLANT
SPONGE T-LAP 18X18 ~~LOC~~+RFID (SPONGE) ×2 IMPLANT
STAPLE NITINOL 20X20 (Staple) IMPLANT
STOCKINETTE 6 STRL (DRAPES) ×2 IMPLANT
STRIP CLOSURE SKIN 1/2X4 (GAUZE/BANDAGES/DRESSINGS) IMPLANT
SUCTION TUBE FRAZIER 10FR DISP (SUCTIONS) ×2 IMPLANT
SUT ETHILON 3 0 PS 1 (SUTURE) ×2 IMPLANT
SUT MNCRL AB 3-0 PS2 18 (SUTURE) ×2 IMPLANT
SUT VIC AB 2-0 SH 27XBRD (SUTURE) IMPLANT
SUT VICRYL 0 SH 27 (SUTURE) IMPLANT
SYR BULB EAR ULCER 3OZ GRN STR (SYRINGE) ×2 IMPLANT
SYR CONTROL 10ML LL (SYRINGE) IMPLANT
TOWEL GREEN STERILE FF (TOWEL DISPOSABLE) ×4 IMPLANT
TUBE CONNECTING 20X1/4 (TUBING) ×2 IMPLANT
UNDERPAD 30X36 HEAVY ABSORB (UNDERPADS AND DIAPERS) ×2 IMPLANT
YANKAUER SUCT BULB TIP NO VENT (SUCTIONS) IMPLANT

## 2023-04-27 NOTE — Anesthesia Postprocedure Evaluation (Signed)
 Anesthesia Post Note  Patient: Joshua Min Sr.  Procedure(s) Performed: Sallye Lat AND TALONAVICULAR ARTHRODESIS ANKLE (Right: Ankle) GASTROCNEMIUS RECESSION (Right: Leg Lower)     Patient location during evaluation: PACU Anesthesia Type: Regional and General Level of consciousness: awake and alert Pain management: pain level controlled Vital Signs Assessment: post-procedure vital signs reviewed and stable Respiratory status: spontaneous breathing, nonlabored ventilation, respiratory function stable and patient connected to nasal cannula oxygen Cardiovascular status: blood pressure returned to baseline and stable Postop Assessment: no apparent nausea or vomiting Anesthetic complications: no   No notable events documented.  Last Vitals:  Vitals:   04/27/23 1000 04/27/23 1029  BP: 129/69 (!) 144/75  Pulse: 73 77  Resp: 17 15  Temp:  (!) 36.3 C  SpO2: 93% 93%    Last Pain:  Vitals:   04/27/23 1029  TempSrc:   PainSc: 0-No pain                 Jerrod Damiano

## 2023-04-27 NOTE — Progress Notes (Signed)
Assisted Dr. Oddono with right, popliteal, ultrasound guided block. Side rails up, monitors on throughout procedure. See vital signs in flow sheet. Tolerated Procedure well. 

## 2023-04-27 NOTE — Op Note (Signed)
 04/27/2023  9:23 AM  PATIENT:  Joshua Min Sr.  73 y.o. male  PRE-OPERATIVE DIAGNOSIS: 1.  Right short achilles tendon      2.  Right posterior tibial tendonitis stage III      3.  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  Right gastrocnemius recession 2.  Right subtalar joint arthrodesis 3.  Right talonavicular joint arthrodesis 4.  Right ankle AP and lateral radiographs 5.  Right foot AP, lateral and Harris heel radiographs  SURGEON:  Toni Arthurs, MD  ASSISTANT: Alfredo Martinez, PA-C  ANESTHESIA:   General, regional  EBL:  minimal   TOURNIQUET:   Total Tourniquet Time Documented: Thigh (Right) - 81 minutes Total: Thigh (Right) - 81 minutes  COMPLICATIONS:  None apparent  DISPOSITION:  Extubated, awake and stable to recovery.  INDICATION FOR PROCEDURE: 73 year old male presents today with a history of worsening right foot pain.  He has stage III posterior tibial tendon dysfunction with arthritis through the subtalar and talonavicular joints.  He also has a tight heel cord.  He has failed nonoperative treatment and presents today for surgical correction of these painful hindfoot deformities.  The risks and benefits of the alternative treatment options have been discussed in detail.  The patient wishes to proceed with surgery and specifically understands risks of bleeding, infection, nerve damage, blood clots, need for additional surgery, amputation and death.   PROCEDURE IN DETAIL:  After pre operative consent was obtained, and the correct operative site was identified, the patient was brought to the operating room and placed supine on the OR table.  Anesthesia was administered.  Pre-operative antibiotics were administered.  A surgical timeout was taken.  The right lower extremity was prepped and draped in standard sterile fashion with a tourniquet around the thigh.  The extremity was elevated, and the  tourniquet was inflated to 250 mmHg.  A longitudinal incision was made over the medial calf.  Dissection was carried sharply down through the subcutaneous tissues.  Superficial fascia was incised.  The gastrocnemius tendon was identified.  It was divided from medial to lateral under direct vision taking care to protect the sural nerve and lesser saphenous vein.  The ankle would then dorsiflex to 20 degrees with the knee extended and hindfoot corrected to neutral.  The wound was irrigated and sprinkled with vancomycin powder.  Subcutaneous tissues were approximated with Monocryl.  Skin incision was closed with nylon.  Attention was turned to the lateral hindfoot.  A curvilinear incision was made adjacent to the sinus Tarsi.  Dissection was carried down through the subcutaneous tissues.  The posterior facet was identified.  It was opened with a lamina spreader.  Remaining articular cartilage was removed from both sides of the joint.  The subchondral bone was then perforated on both sides of the joint with a drill bit and further broken up with a curved osteotome.  The joint was reduced.  A guidepin for the 8 mm [employer] cannulated screw was placed from the tuberosity across the posterior facet into the head of the talus.  AP and lateral radiographs of the ankle showed appropriate position of the guidepin.  AP, lateral and Harris heel views of the foot showed appropriate reduction of the subtalar joint.  The guidepin was measured and overdrilled.  An 8 mm partially-threaded screw was inserted and tightened securely.  It was noted to compress the posterior facet of the subtalar joint appropriately.  The posterior  incision was closed with nylon.  Attention was turned to the dorsal hindfoot.  The incision was made over the talonavicular joint.  Dissection was carried down through the subcutaneous tissues.  The retinaculum was incised.  The interval between the EHL and EHB was developed.  The neurovascular bundle  was protected.  The dorsal joint capsule for the talonavicular joint was incised and elevated medially and laterally.  The talonavicular joint was opened and held with a lamina spreader.  The remaining articular cartilage was removed from both sides of the joint.  The joint was irrigated.  Subchondral bone was then perforated with a drill bit on both sides of the joint and further broken up with a curved osteotome.  The joint was reduced.  A guidewire for the 5 mm [employer] cannulated screws was advanced from the distal navicular across the talonavicular joint into the head of the talus.  AP and lateral radiographs confirmed appropriate position of the guidepin.  It was overdrilled.  A partially-threaded 5 mm cannulated screw was inserted.  It was noted to have excellent purchase and compressed the joint appropriately.  Dorsal osteophytes were removed.  Two 20 mm staples were placed across the joint.  These were [employer] nitinol staples.  Both were noted to have excellent purchase and were seated appropriately at the level of bone.  A guidepin for the 6.5 mm cannulated screws was then inserted from the central talar neck across to the anterior process of the calcaneus.  AP, lateral and Harris heel views showed appropriate position of the guidepin.  It was overdrilled.  A fully threaded 6.5 millimeter screw was inserted and was noted to have excellent purchase.  Final AP and lateral radiographs of the ankle and AP, lateral and Harris heel radiographs of the foot showed appropriate arthrodesis of the talonavicular and subtalar joints in appropriate position and length of all hardware.  The wounds were irrigated copiously and sprinkled with vancomycin powder.  The retinaculum was repaired with Vicryl.  Subcutaneous tissues were approximated with Monocryl.  Skin incisions were closed with nylon.  Sterile dressings were applied followed by a well-padded short leg splint.  The tourniquet was released after  application of the dressings.  The patient was awakened from anesthesia and transported to the recovery room in stable condition.   FOLLOW UP PLAN: Nonweightbearing on the right lower extremity in a short leg splint.  Follow-up in 2 weeks for suture removal and conversion to a short leg cast.  Xarelto for DVT prophylaxis.   RADIOGRAPHS: AP and lateral radiographs of the right ankle are obtained intraoperatively.  These show interval arthrodesis of the subtalar joint and appropriate position of the screw in the talar dome.  No other acute injuries at the level of the ankle.  AP, lateral and Harris heel radiographs of the right foot are obtained intraoperatively.  These show interval arthrodesis of the subtalar and talonavicular joints with appropriate position of all hardware.  The flatfoot is appropriately corrected.  No other acute injuries are noted at the foot.  Incidental note of a significant bunion deformity as well as a second hammertoe.    Alfredo Martinez PA-C was present and scrubbed for the duration of the operative case. His assistance was essential in positioning the patient, prepping and draping, gaining and maintaining exposure, performing the operation, closing and dressing the wounds and applying the splint.

## 2023-04-27 NOTE — Transfer of Care (Signed)
 Immediate Anesthesia Transfer of Care Note  Patient: Leda Min Sr.  Procedure(s) Performed: Sallye Lat AND TALONAVICULAR ARTHRODESIS ANKLE (Right: Ankle) GASTROCNEMIUS RECESSION (Right: Leg Lower)  Patient Location: PACU  Anesthesia Type:General and Regional  Level of Consciousness: awake, alert , and patient cooperative  Airway & Oxygen Therapy: Patient Spontanous Breathing and Patient connected to face mask oxygen  Post-op Assessment: Report given to RN and Post -op Vital signs reviewed and stable  Post vital signs: Reviewed and stable  Last Vitals:  Vitals Value Taken Time  BP 145/77 04/27/23 0920  Temp    Pulse 80 04/27/23 0926  Resp 18 04/27/23 0926  SpO2 96 % 04/27/23 0926  Vitals shown include unfiled device data.  Last Pain:  Vitals:   04/27/23 0642  TempSrc: Temporal  PainSc: 0-No pain      Patients Stated Pain Goal: 3 (04/27/23 4098)  Complications: No notable events documented.

## 2023-04-27 NOTE — Anesthesia Procedure Notes (Signed)
 Anesthesia Regional Block: Popliteal block   Pre-Anesthetic Checklist: , timeout performed,  Correct Patient, Correct Site, Correct Laterality,  Correct Procedure, Correct Position, site marked,  Risks and benefits discussed,  Surgical consent,  Pre-op evaluation,  At surgeon's request and post-op pain management  Laterality: Right  Prep: chloraprep       Needles:  Injection technique: Single-shot  Needle Type: Echogenic Stimulator Needle     Needle Length: 5cm  Needle Gauge: 22     Additional Needles:   Procedures:, nerve stimulator,,, ultrasound used (permanent image in chart),,     Nerve Stimulator or Paresthesia:  Response: foot, 0.45 mA  Additional Responses:   Narrative:  Start time: 04/27/2023 7:00 AM End time: 04/27/2023 7:07 AM Injection made incrementally with aspirations every 5 mL.  Performed by: Personally  Anesthesiologist: Bethena Midget, MD  Additional Notes: Functioning IV was confirmed and monitors were applied.  A 50mm 22ga Arrow echogenic stimulator needle was used. Sterile prep and drape,hand hygiene and sterile gloves were used. Ultrasound guidance: relevant anatomy identified, needle position confirmed, local anesthetic spread visualized around nerve(s)., vascular puncture avoided.  Image printed for medical record. Negative aspiration and negative test dose prior to incremental administration of local anesthetic. The patient tolerated the procedure well. NO PIK

## 2023-04-27 NOTE — Anesthesia Procedure Notes (Signed)
 Procedure Name: Intubation Date/Time: 04/27/2023 7:45 AM  Performed by: Karen Kitchens, CRNAPre-anesthesia Checklist: Patient identified, Emergency Drugs available, Suction available and Patient being monitored Patient Re-evaluated:Patient Re-evaluated prior to induction Oxygen Delivery Method: Circle system utilized Preoxygenation: Pre-oxygenation with 100% oxygen Induction Type: IV induction Ventilation: Mask ventilation without difficulty Laryngoscope Size: Mac and 4 Grade View: Grade II Tube type: Oral Tube size: 7.5 mm Number of attempts: 1 Airway Equipment and Method: Stylet and Oral airway Placement Confirmation: ETT inserted through vocal cords under direct vision, positive ETCO2, breath sounds checked- equal and bilateral and CO2 detector Secured at: 23 cm Tube secured with: Tape Dental Injury: Teeth and Oropharynx as per pre-operative assessment

## 2023-04-27 NOTE — Discharge Instructions (Addendum)
 Toni Arthurs, MD [facility]  Please read the following information regarding your care after surgery.  Medications  You only need a prescription for the narcotic pain medicine (ex. oxycodone, Percocet, Norco).  All of the other medicines listed below are available over the counter. ? Aleve 2 pills twice a day for the first 3 days after surgery. ? acetominophen (Tylenol) 650 mg every 4-6 hours as you need for minor to moderate pain ? oxycodone as prescribed for severe pain  Narcotic pain medicine (ex. oxycodone, Percocet, Vicodin) will cause constipation.  To prevent this problem, take the following medicines while you are taking any pain medicine. ? docusate sodium (Colace) 100 mg twice a day ? senna (Senokot) 2 tablets twice a day  ? To help prevent blood clots, take Xarelto as prescribed for two weeks after surgery.  You should also get up every hour while you are awake to move around.    Weight Bearing ? Do not bear any weight on the operated leg or foot.  Cast / Splint / Dressing ? Keep your splint, cast or dressing clean and dry.  Don't put anything (coat hanger, pencil, etc) down inside of it.  If it gets damp, use a hair dryer on the cool setting to dry it.  If it gets soaked, call the office to schedule an appointment for a cast change.    After your dressing, cast or splint is removed; you may shower, but do not soak or scrub the wound.  Allow the water to run over it, and then gently pat it dry.  Swelling It is normal for you to have swelling where you had surgery.  To reduce swelling and pain, keep your toes above your nose for at least 3 days after surgery.  It may be necessary to keep your foot or leg elevated for several weeks.  If it hurts, it should be elevated.  Follow Up Call my office at [phone] when you are discharged from the hospital or surgery center to schedule an appointment to be seen two weeks after surgery.  Call my office at [phone] if you develop  a fever >101.5 F, nausea, vomiting, bleeding from the surgical site or severe pain.           Regional Anesthesia Blocks  1. You may not be able to move or feel the "blocked" extremity after a regional anesthetic block. This may last may last from 3-48 hours after placement, but it will go away. The length of time depends on the medication injected and your individual response to the medication. As the nerves start to wake up, you may experience tingling as the movement and feeling returns to your extremity. If the numbness and inability to move your extremity has not gone away after 48 hours, please call your surgeon.   2. The extremity that is blocked will need to be protected until the numbness is gone and the strength has returned. Because you cannot feel it, you will need to take extra care to avoid injury. Because it may be weak, you may have difficulty moving it or using it. You may not know what position it is in without looking at it while the block is in effect.  3. For blocks in the legs and feet, returning to weight bearing and walking needs to be done carefully. You will need to wait until the numbness is entirely gone and the strength has returned. You should be able to move your leg and foot normally  before you try and bear weight or walk. You will need someone to be with you when you first try to ensure you do not fall and possibly risk injury.  4. Bruising and tenderness at the needle site are common side effects and will resolve in a few days.  5. Persistent numbness or new problems with movement should be communicated to the surgeon or the [facility] [phone] [facility] [phone]).  No Tylenol Or Ibuprofen until after 2:45 PM today

## 2023-04-28 ENCOUNTER — Encounter (HOSPITAL_BASED_OUTPATIENT_CLINIC_OR_DEPARTMENT_OTHER): Payer: Self-pay | Admitting: Orthopedic Surgery

## 2023-05-01 ENCOUNTER — Ambulatory Visit: Payer: Medicare Other | Admitting: Internal Medicine

## 2023-06-28 ENCOUNTER — Other Ambulatory Visit: Payer: Self-pay

## 2023-06-28 ENCOUNTER — Other Ambulatory Visit: Payer: Self-pay | Admitting: Internal Medicine

## 2023-06-29 ENCOUNTER — Other Ambulatory Visit: Payer: Self-pay | Admitting: Internal Medicine

## 2023-07-05 ENCOUNTER — Encounter: Payer: Self-pay | Admitting: Gastroenterology

## 2023-07-05 ENCOUNTER — Ambulatory Visit (INDEPENDENT_AMBULATORY_CARE_PROVIDER_SITE_OTHER): Payer: Medicare Other | Admitting: Gastroenterology

## 2023-07-05 DIAGNOSIS — Z860100 Personal history of colon polyps, unspecified: Secondary | ICD-10-CM

## 2023-07-05 DIAGNOSIS — R194 Change in bowel habit: Secondary | ICD-10-CM | POA: Diagnosis not present

## 2023-07-05 DIAGNOSIS — D509 Iron deficiency anemia, unspecified: Secondary | ICD-10-CM

## 2023-07-05 DIAGNOSIS — Z9884 Bariatric surgery status: Secondary | ICD-10-CM

## 2023-07-05 MED ORDER — NA SULFATE-K SULFATE-MG SULF 17.5-3.13-1.6 GM/177ML PO SOLN: 1.0000 | Freq: Once | ORAL | 0 refills | Status: AC

## 2023-07-05 NOTE — Progress Notes (Signed)
 HPI :  73 year old male here for follow-up visit for iron  deficiency anemia.  Recall he has had iron  deficiency for a few years now.  He has a history of Roux-en-Y gastric bypass.  Colonoscopy with me in April 2022 showed 10 polyps removed but none high risk, told to repeat an exam in 3 years.  Since have last seen him in the office he had an EGD in November 2023.  He had normal post gastric bypass anatomy without any concerning pathology.  Biopsies negative for H. pylori.  Since of last seen him he has been taking over-the-counter iron  pills twice daily.  He states he tolerates them pretty well.  He does not see any obvious blood in his stool, occasionally has some hemorrhoidal symptoms with some prolapse with blood noted on the toilet paper at times.  He does not have much constipation but can have some looser stool/soft stools at times which can occasionally lead to leakage.Aaron Aas  He denies any cardiopulmonary symptoms.  His hemoglobin has remained normal, last checked in February and at 14.  His total iron  level and iron  sat remains normal, MCV is normal in the 90s, ferritin remains slightly low.  He otherwise feels well without any cardiopulmonary symptoms currently.   EGD 01/17/22: - Esophagogastric landmarks were identified: the Z-line was found at 37 cm, the gastroesophageal junction was found at 37 cm and the upper extent of the gastric folds was found at 39 cm from the incisors. Findings: - A 2 cm hiatal hernia was present. - The examined esophagus was tortuous. - The exam of the esophagus was otherwise normal. - Evidence of a Roux-en-Y gastrojejunostomy was found. The gastrojejunal anastomosis was characterized by healthy appearing mucosa. No ulcerations. - The exam of the gastric pouch was otherwise normal. Retroflexed views obtained but difficult to see cardia given anatomy. - Biopsies were taken with a cold forceps for Helicobacter pylori testing. - The examined small bowel limbs were  normal (blind pouch and distal limb, which was deeply intubated).  Surgical [P], gastric pouch biopsies - OXYNTIC MUCOSA WITH NO SIGNIFICANT PATHOLOGY. - NO HELICOBACTER PYLORI ORGANISMS IDENTIFIED ON H&E STAINED SLIDE.  Colonoscopy 06/30/20 - The perianal and digital rectal examinations were normal. - A 3 mm polyp was found in the cecum. The polyp was sessile. The polyp was removed with a cold snare. Resection and retrieval were complete. - A 3 mm polyp was found in the ascending colon. The polyp was sessile. The polyp was removed with a cold snare. Resection and retrieval were complete. - Six sessile polyps were found in the transverse colon. The polyps were 3 to 6 mm in size. These polyps were removed with a cold snare. Resection and retrieval were complete. - A 3 mm polyp was found in the splenic flexure. The polyp was sessile. The polyp was removed with a cold snare. Resection and retrieval were complete. - A 3 mm polyp was found in the descending colon. The polyp was sessile. The polyp was removed with a cold snare. Resection and retrieval were complete. - Multiple small-mouthed diverticula were found in the left colon and right colon. - Anal papilla(e) were hypertrophied. - Internal hemorrhoids were found during retroflexion. - The exam was otherwise without abnormality.   Surgical [P], colon, ascending and cecum and transverse - 6 and splenic, polyp (10) - TUBULAR ADENOMA (MULTIPLE FRAGMENTS). - NO HIGH GRADE DYSPLASIA OR MALIGNANCY.   Lab Results  Component Value Date   WBC 6.5 04/10/2023  HGB 14.2 04/10/2023   HCT 42.2 04/10/2023   MCV 91.8 04/10/2023   PLT 186.0 04/10/2023    2 mo ago (04/10/23) 8 mo ago (10/14/22) 1 yr ago (06/13/22) 1 yr ago (03/01/22) 1 yr ago (12/23/21) 1 yr ago (12/23/21) 1 yr ago (11/24/21) 1 yr ago (11/24/21)   Iron  68 69 51 44 60   32 Low   Transferrin 228.0 220.0 220.0 262.0 270.0   274.0  Saturation Ratios 21.3 22.4 16.6 Low  12.0 Low  15.9 Low    8.3  Low   Ferritin 16.9 Low  22.7 15.6 Low  9.2 Low   5.5 Low  6.0 Low    TIBC 319.2 308.0 308.0 366.8 378.0   383.6    Lab Results  Component Value Date   IRON  68 04/10/2023   TIBC 319.2 04/10/2023   FERRITIN 16.9 (L) 04/10/2023    Past Medical History:  Diagnosis Date   Acute pharyngitis 01/26/2009   ALLERGIC RHINITIS 08/07/2007   Allergy    ARTHRITIS 08/07/2007   BACK PAIN 09/15/2009   BENIGN PROSTATIC HYPERTROPHY 05/20/2008   Cataract    removed bilaterally    EATING DISORDER, HX OF 08/07/2007   ERECTILE DYSFUNCTION 02/18/2008   GERD (gastroesophageal reflux disease)    HYPERLIPIDEMIA 08/07/2007   HYPERTENSION 08/07/2007   NECK PAIN, RIGHT 09/15/2009   Neuromuscular disorder (HCC)    carpal tunnel    OSTEOARTHRITIS, KNEES, 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;  Service: Orthopedics;  Laterality: Right;   Left foot surgery  2008   Lower back  2004   POLYPECTOMY     Right knee arthroscopy  1994   UPPER GASTROINTESTINAL ENDOSCOPY     Family History  Problem Relation Age of Onset   Hyperlipidemia Father    Heart disease Father    Hypertension Father    Colon polyps Neg Hx    Esophageal cancer Neg Hx    Rectal cancer Neg Hx    Stomach cancer Neg Hx    Pancreatic cancer Neg Hx    Colon cancer Neg Hx    Social History   Tobacco Use   Smoking status: Never   Smokeless tobacco: Never  Vaping Use   Vaping status: Never Used  Substance Use Topics   Alcohol use: Yes    Comment:  occasional beer    Drug use: No   Current Outpatient Medications  Medication Sig Dispense Refill   acetaminophen  (TYLENOL  8 HOUR) 650 MG CR tablet 4 tablets twice a day by oral route.     amLODipine  (NORVASC ) 5 MG tablet Take 1 tablet (5 mg total) by mouth daily. 90 tablet 3   atorvastatin  (LIPITOR) 40 MG tablet Take 1 tablet (40 mg total) by mouth daily. 90 tablet 3   cetirizine  (ZYRTEC ) 10 MG tablet Take 10 mg by mouth daily.     Cholecalciferol (VITAMIN D3 PO) Take 5,000 Int'l Units by mouth.     Cholecalciferol 250 MCG (10000 UT) TABS  docusate sodium  (COLACE) 100 MG capsule Take 1 capsule (100 mg total) by mouth 2 (two) times daily. While taking narcotic pain medicine. 30 capsule 0   FISH OIL-KRILL OIL PO 500 mg.     furosemide  (LASIX ) 20 MG tablet 1 tab by mouth in the AM, and 1 in the PM as needed for persistent swelling 180 tablet 3   irbesartan  (AVAPRO ) 150 MG tablet Take 1 tablet (150 mg total) by mouth daily. 90 tablet 3   Multiple Vitamins-Minerals (MULTIVITAMINS THER. W/MINERALS) TABS tablet Take 1 tablet by mouth daily.     POLY-IRON  150 150 MG capsule TAKE 2 CAPSULES BY MOUTH EVERY DAY 180 capsule 3   Probiotic Product (PROBIOTIC DAILY PO) Take by mouth. [facility] colon health     senna (SENOKOT) 8.6 MG TABS tablet Take 2 tablets (17.2 mg total) by mouth 2 (two) times daily. 30 tablet 0   tadalafil  (CIALIS ) 20 MG tablet Take 1 tablet (20 mg total) by mouth daily as needed. 10 tablet 11   tamsulosin  (FLOMAX ) 0.4 MG CAPS capsule Take 2 capsules (0.8 mg total) by mouth daily. 90 capsule 3   tiZANidine  (ZANAFLEX ) 2 MG tablet TAKE 1 TABLET BY MOUTH EVERYDAY AT BEDTIME 90 tablet 1   traMADol  (ULTRAM ) 50 MG tablet Take 50 mg by mouth every 6 (six) hours as needed.     Turmeric 1053 MG TABS turmeric     No current facility-administered medications for this visit.   Allergies  Allergen Reactions   Ace Inhibitors     REACTION: tongue swelling     Review of Systems: All  systems reviewed and negative except where noted in HPI.   Labs Per HPI  Physical Exam: BP (!) 148/72   Pulse 85   Ht 5\' 11"  (1.803 m)   Wt 284 lb (128.8 kg) Comment: with a orthopedic boot  BMI 39.61 kg/m  Constitutional: Pleasant,well-developed, male in no acute distress. Neurological: Alert and oriented to person place and time. Psychiatric: Normal mood and affect. Behavior is normal.   ASSESSMENT: 73 y.o. male here for assessment of the following  1. Iron  deficiency anemia, unspecified iron  deficiency anemia type   2. History of colon polyps   3. Altered bowel habits   4. History of Roux-en-Y gastric bypass    Longstanding iron  deficiency anemia very likely to his post gastric bypass date.  He has responded to oral iron  supplementation with normalization of his hemoglobin, MCV is normal, iron  studies improved albeit ferritin remains slightly low.  We discussed iron  deficiency anemia.  As long as his hemoglobin remains normal and MCV okay I think we can continue oral iron  and do not think he needs IV iron  right now.  We can continue to check his labs every 6 months or so.  He is due for surveillance colonoscopy for his history of polyps.  We discussed colonoscopy and risks of the procedure and that of anesthesia.  Following this discussion he wishes to proceed with surveillance colonoscopy.  To help with his stool form, and to help bulk his stool, recommend a daily fiber supplement.  We discussed some options.  He agrees   PLAN: - continue oral iron  - add daily fiber supplement to help stool form, hemorrhoids - due for colonoscopy at the [facility] for history of polyps - CBC and TIBC / ferritin panel 6 months from his last draw   Christi Coward, MD [facility]

## 2023-07-05 NOTE — Patient Instructions (Addendum)
 You have been scheduled for a colonoscopy. Please follow written instructions given to you at your visit today.   If you use inhalers (even only as needed), please bring them with you on the day of your procedure.  DO NOT TAKE 7 DAYS PRIOR TO TEST- Trulicity (dulaglutide) Ozempic, Wegovy  (semaglutide ) Mounjaro (tirzepatide) Bydureon Bcise (exanatide extended release)  DO NOT TAKE 1 DAY PRIOR TO YOUR TEST Rybelsus (semaglutide ) Adlyxin (lixisenatide) Victoza (liraglutide) Byetta (exanatide) ___________________________________________________________________________   Continue oral iron   Please start taking an daily fiber supplement.  You will be due for labs in August.  We will remind you when it is time to go to the lab.  Thank you for entrusting me with your care and for choosing [facility], Dr. Alvester Johnson    If your blood pressure at your visit was 140/90 or greater, please contact your primary care physician to follow up on this. ______________________________________________________  If you are age 73 or older, your body mass index should be between 23-30. Your Body mass index is 39.61 kg/m. If this is out of the aforementioned range listed, please consider follow up with your Primary Care Provider.  If you are age 73 or younger, your body mass index should be between 19-25. Your Body mass index is 39.61 kg/m. If this is out of the aformentioned range listed, please consider follow up with your Primary Care Provider.  ________________________________________________________  The [facility] providers would like to encourage you to use MYCHART to communicate with providers for non-urgent requests or questions.  Due to long hold times on the telephone, sending your provider a message by [facility] may be a faster and more efficient way to get a response.  Please allow 48 business hours for a response.  Please remember that this is for non-urgent requests.   _______________________________________________________  Due to recent changes in healthcare laws, you may see the results of your imaging and laboratory studies on MyChart before your provider has had a chance to review them.  We understand that in some cases there may be results that are confusing or concerning to you. Not all laboratory results come back in the same time frame and the provider may be waiting for multiple results in order to interpret others.  Please give us  48 hours in order for your provider to thoroughly review all the results before contacting the office for clarification of your results.

## 2023-07-12 ENCOUNTER — Other Ambulatory Visit: Payer: Self-pay | Admitting: Internal Medicine

## 2023-07-12 DIAGNOSIS — T8189XA Other complications of procedures, not elsewhere classified, initial encounter: Secondary | ICD-10-CM | POA: Insufficient documentation

## 2023-07-26 ENCOUNTER — Ambulatory Visit (INDEPENDENT_AMBULATORY_CARE_PROVIDER_SITE_OTHER): Payer: Medicare Other | Admitting: Internal Medicine

## 2023-07-26 ENCOUNTER — Ambulatory Visit: Payer: Self-pay | Admitting: Internal Medicine

## 2023-07-26 ENCOUNTER — Ambulatory Visit (INDEPENDENT_AMBULATORY_CARE_PROVIDER_SITE_OTHER)

## 2023-07-26 VITALS — BP 160/86 | HR 70 | Temp 98.3°F | Ht 71.0 in | Wt 280.0 lb

## 2023-07-26 DIAGNOSIS — E559 Vitamin D deficiency, unspecified: Secondary | ICD-10-CM | POA: Diagnosis not present

## 2023-07-26 DIAGNOSIS — R739 Hyperglycemia, unspecified: Secondary | ICD-10-CM

## 2023-07-26 DIAGNOSIS — E538 Deficiency of other specified B group vitamins: Secondary | ICD-10-CM

## 2023-07-26 DIAGNOSIS — E7800 Pure hypercholesterolemia, unspecified: Secondary | ICD-10-CM | POA: Diagnosis not present

## 2023-07-26 DIAGNOSIS — R0609 Other forms of dyspnea: Secondary | ICD-10-CM | POA: Diagnosis not present

## 2023-07-26 DIAGNOSIS — I10 Essential (primary) hypertension: Secondary | ICD-10-CM

## 2023-07-26 DIAGNOSIS — N320 Bladder-neck obstruction: Secondary | ICD-10-CM

## 2023-07-26 LAB — URINALYSIS, ROUTINE W REFLEX MICROSCOPIC
Bilirubin Urine: NEGATIVE
Hgb urine dipstick: NEGATIVE
Ketones, ur: NEGATIVE
Leukocytes,Ua: NEGATIVE
Nitrite: NEGATIVE
RBC / HPF: NONE SEEN (ref 0–?)
Specific Gravity, Urine: 1.015 (ref 1.000–1.030)
Total Protein, Urine: NEGATIVE
Urine Glucose: NEGATIVE
Urobilinogen, UA: 1 (ref 0.0–1.0)
pH: 7 (ref 5.0–8.0)

## 2023-07-26 LAB — CBC WITH DIFFERENTIAL/PLATELET
Basophils Absolute: 0 10*3/uL (ref 0.0–0.1)
Basophils Relative: 0.3 % (ref 0.0–3.0)
Eosinophils Absolute: 0.2 10*3/uL (ref 0.0–0.7)
Eosinophils Relative: 3 % (ref 0.0–5.0)
HCT: 42 % (ref 39.0–52.0)
Hemoglobin: 14.1 g/dL (ref 13.0–17.0)
Lymphocytes Relative: 17.1 % (ref 12.0–46.0)
Lymphs Abs: 1 10*3/uL (ref 0.7–4.0)
MCHC: 33.6 g/dL (ref 30.0–36.0)
MCV: 88.6 fl (ref 78.0–100.0)
Monocytes Absolute: 0.6 10*3/uL (ref 0.1–1.0)
Monocytes Relative: 10.7 % (ref 3.0–12.0)
Neutro Abs: 4.1 10*3/uL (ref 1.4–7.7)
Neutrophils Relative %: 68.9 % (ref 43.0–77.0)
Platelets: 214 10*3/uL (ref 150.0–400.0)
RBC: 4.74 Mil/uL (ref 4.22–5.81)
RDW: 13.7 % (ref 11.5–15.5)
WBC: 6 10*3/uL (ref 4.0–10.5)

## 2023-07-26 LAB — MICROALBUMIN / CREATININE URINE RATIO
Creatinine,U: 108.9 mg/dL
Microalb Creat Ratio: UNDETERMINED mg/g (ref 0.0–30.0)
Microalb, Ur: <0.7 mg/dL

## 2023-07-26 LAB — LIPID PANEL
Cholesterol: 157 mg/dL (ref 0–200)
HDL: 55.6 mg/dL (ref >39.00)
LDL Cholesterol: 84 mg/dL (ref 0–99)
NonHDL: 100.94
Total CHOL/HDL Ratio: 3
Triglycerides: 85 mg/dL (ref 0.0–149.0)
VLDL: 17 mg/dL (ref 0.0–40.0)

## 2023-07-26 LAB — BASIC METABOLIC PANEL WITH GFR
BUN: 16 mg/dL (ref 6–23)
CO2: 31 meq/L (ref 19–32)
Calcium: 9.2 mg/dL (ref 8.4–10.5)
Chloride: 102 meq/L (ref 96–112)
Creatinine, Ser: 0.8 mg/dL (ref 0.40–1.50)
GFR: 88.4 mL/min (ref >60.00)
Glucose, Bld: 85 mg/dL (ref 70–99)
Potassium: 3.9 meq/L (ref 3.5–5.1)
Sodium: 140 meq/L (ref 135–145)

## 2023-07-26 LAB — HEPATIC FUNCTION PANEL
ALT: 19 U/L (ref 0–53)
AST: 19 U/L (ref 0–37)
Albumin: 4.2 g/dL (ref 3.5–5.2)
Alkaline Phosphatase: 105 U/L (ref 39–117)
Bilirubin, Direct: 0.2 mg/dL (ref 0.0–0.3)
Total Bilirubin: 0.9 mg/dL (ref 0.2–1.2)
Total Protein: 6.7 g/dL (ref 6.0–8.3)

## 2023-07-26 LAB — VITAMIN D 25 HYDROXY (VIT D DEFICIENCY, FRACTURES): VITD: 35.35 ng/mL (ref 30.00–100.00)

## 2023-07-26 LAB — PSA: PSA: 2.95 ng/mL (ref 0.10–4.00)

## 2023-07-26 LAB — VITAMIN B12: Vitamin B-12: >1500 pg/mL — ABNORMAL HIGH (ref 211–911)

## 2023-07-26 LAB — TSH: TSH: 0.85 u[IU]/mL (ref 0.35–5.50)

## 2023-07-26 LAB — HEMOGLOBIN A1C: Hgb A1c MFr Bld: 6 % (ref 4.6–6.5)

## 2023-07-26 MED ORDER — IRBESARTAN 300 MG PO TABS: 300.0000 mg | ORAL_TABLET | Freq: Every day | ORAL | 3 refills | Status: AC

## 2023-07-26 NOTE — Progress Notes (Signed)
 Patient ID: Joshua Button Sr., male   DOB: 1950/05/31, 73 y.o.   MRN: [id number]         Chief Complaint:: yearly exam       HPI:  Joshua Blank Sr. is a 73 y.o. male here  overall doing ok, but does have recent worsening DOE for unclear reason, despite losing several lbs recently.  Pt denies chest pain, wheezing, orthopnea, PND, increased LE swelling, palpitations, dizziness or syncope.  Pt denies polydipsia, polyuria, or new focal neuro s/s.  Good compliance with iron  2 capsules per day without constipation.  Has colonoscopy scheduled soon June 19.  Still working full time.  Had right ankle fusion with Dr Rosebud Confer recently , has a small wound non healing now, to start wound clinic next month.   Wt Readings from Last 3 Encounters:  07/26/23 280 lb (127 kg)  07/05/23 284 lb (128.8 kg)  04/27/23 288 lb 12.8 oz (131 kg)   BP Readings from Last 3 Encounters:  07/26/23 (!) 160/86  07/05/23 (!) 148/72  04/27/23 (!) 144/75   Immunization History  Administered Date(s) Administered   Fluad Quad(high Dose 65+) 11/15/2018, 12/30/2019   Influenza Split 12/21/2010, 12/22/2011   Influenza Whole 01/02/2009, 12/18/2009   Influenza,inj,Quad PF,6+ Mos 12/28/2012, 01/09/2015, 01/26/2016   Influenza-Unspecified 11/12/2016, 12/13/2017, 12/12/2020, 12/04/2021, 12/15/2022   PFIZER Comirnaty(Gray Top)Covid-19 Tri-Sucrose Vaccine 06/13/2020   PFIZER(Purple Top)SARS-COV-2 Vaccination 04/12/2019, 05/07/2019, 12/05/2019   Pfizer Covid-19 Vaccine Bivalent Booster 58yrs & up 12/22/2020   Pfizer(Comirnaty)Fall Seasonal Vaccine 12 years and older 12/04/2021   Pneumococcal Conjugate-13 08/30/2016   Pneumococcal Polysaccharide-23 10/04/2017   Tdap 12/21/2010   Unspecified SARS-COV-2 Vaccination 12/04/2021   Zoster Recombinant(Shingrix ) 04/25/2017, 09/24/2017, 01/11/2019   Zoster, Live 12/28/2012   Health Maintenance Due  Topic Date Due   Colonoscopy  07/01/2023      Past Medical History:   Diagnosis Date   Acute pharyngitis 01/26/2009   ALLERGIC RHINITIS 08/07/2007   Allergy    ARTHRITIS 08/07/2007   BACK PAIN 09/15/2009   BENIGN PROSTATIC HYPERTROPHY 05/20/2008   Cataract    removed bilaterally    EATING DISORDER, HX OF 08/07/2007   ERECTILE DYSFUNCTION 02/18/2008   GERD (gastroesophageal reflux disease)    HYPERLIPIDEMIA 08/07/2007   HYPERTENSION 08/07/2007   NECK PAIN, RIGHT 09/15/2009   Neuromuscular disorder (HCC)    carpal tunnel    OSTEOARTHRITIS, KNEES, BILATERAL 09/15/2009   SINUSITIS- ACUTE-NOS 04/30/2010   Sleep apnea    weas cpap    Past Surgical History:  Procedure Laterality Date   ANKLE FUSION Right 04/27/2023   Procedure: SUBTALAR AND TALONAVICULAR ARTHRODESIS ANKLE;  Surgeon: Amada Backer, MD;  Location: [facility];  Service: Orthopedics;  Laterality: Right;   COLONOSCOPY     GASTRIC BYPASS  2006   GASTROCNEMIUS RECESSION Right 04/27/2023   Procedure: GASTROCNEMIUS RECESSION;  Surgeon: Amada Backer, MD;  Location: [facility];  Service: Orthopedics;  Laterality: Right;   Left foot surgery  2008   Lower back  2004   POLYPECTOMY     Right knee arthroscopy  1994   UPPER GASTROINTESTINAL ENDOSCOPY      reports that he has never smoked. He has never used smokeless tobacco. He reports current alcohol use. He reports that he does not use drugs. family history includes Heart disease in his father; Hyperlipidemia in his father; Hypertension in his father. Allergies  Allergen Reactions   Ace Inhibitors     REACTION: tongue swelling   Current Outpatient  Medications on File Prior to Visit  Medication Sig Dispense Refill   acetaminophen  (TYLENOL  8 HOUR) 650 MG CR tablet 4 tablets twice a day by oral route.     amLODipine  (NORVASC ) 5 MG tablet Take 1 tablet (5 mg total) by mouth daily. 90 tablet 3   atorvastatin  (LIPITOR) 40 MG tablet Take 1 tablet (40 mg total) by mouth daily. 90 tablet 3   cetirizine  (ZYRTEC ) 10 MG  tablet Take 10 mg by mouth daily.     Cholecalciferol (VITAMIN D3 PO) Take 5,000 Int'l Units by mouth.     Cholecalciferol 250 MCG (10000 UT) TABS      diclofenac  Sodium (PENNSAID ) 2 % SOLN      docusate sodium  (COLACE) 100 MG capsule Take 1 capsule (100 mg total) by mouth 2 (two) times daily. While taking narcotic pain medicine. 30 capsule 0   FISH OIL-KRILL OIL PO 500 mg.     furosemide  (LASIX ) 20 MG tablet 1 tab by mouth in the AM, and 1 in the PM as needed for persistent swelling 180 tablet 3   Multiple Vitamins-Minerals (MULTIVITAMINS THER. W/MINERALS) TABS tablet Take 1 tablet by mouth daily.     Na Sulfate-K Sulfate-Mg Sulfate concentrate (SUPREP) 17.5-3.13-1.6 GM/177ML SOLN Take 1 kit by mouth once.     omeprazole  (PRILOSEC) 40 MG capsule Take 40 mg by mouth daily.     POLY-IRON  150 150 MG capsule TAKE 2 CAPSULES BY MOUTH EVERY DAY 180 capsule 3   Probiotic Product (PROBIOTIC DAILY PO) Take by mouth. [facility]     senna (SENOKOT) 8.6 MG TABS tablet Take 2 tablets (17.2 mg total) by mouth 2 (two) times daily. 30 tablet 0   tadalafil  (CIALIS ) 20 MG tablet Take 1 tablet (20 mg total) by mouth daily as needed. 10 tablet 11   tamsulosin  (FLOMAX ) 0.4 MG CAPS capsule Take 2 capsules (0.8 mg total) by mouth daily. 90 capsule 3   tiZANidine  (ZANAFLEX ) 2 MG tablet TAKE 1 TABLET BY MOUTH EVERYDAY AT BEDTIME 90 tablet 1   traMADol  (ULTRAM ) 50 MG tablet TAKE 1 TABLET BY MOUTH EVERY 6 HOURS AS NEEDED FOR MODERATE PAIN 120 tablet 2   Turmeric 1053 MG TABS turmeric     No current facility-administered medications on file prior to visit.        ROS:  All others reviewed and negative.  Objective        PE:  BP (!) 160/86 (BP Location: Left Arm, Patient Position: Sitting, Cuff Size: Normal)   Pulse 70   Temp 98.3 F (36.8 C) (Oral)   Ht 5\' 11"  (1.803 m)   Wt 280 lb (127 kg)   SpO2 94%   BMI 39.05 kg/m                 Constitutional: Pt appears in NAD               HENT: Head:  NCAT.                Right Ear: External ear normal.                 Left Ear: External ear normal.                Eyes: . Pupils are equal, round, and reactive to light. Conjunctivae and EOM are normal               Nose: without d/c or deformity  Neck: Neck supple. Gross normal ROM               Cardiovascular: Normal rate and regular rhythm.                 Pulmonary/Chest: Effort normal and breath sounds without rales or wheezing.                Abd:  Soft, NT, ND, + BS, no organomegaly               Neurological: Pt is alert. At baseline orientation, motor grossly intact               Skin: Skin is warm. No rashes, no other new lesions, LE edema - none, right leg in boot below the knee               Psychiatric: Pt behavior is normal without agitation   Micro: none  Cardiac tracings I have personally interpreted today:  none  Pertinent Radiological findings (summarize): none   Lab Results  Component Value Date   WBC 6.0 07/26/2023   HGB 14.1 07/26/2023   HCT 42.0 07/26/2023   PLT 214.0 07/26/2023   GLUCOSE 85 07/26/2023   CHOL 157 07/26/2023   TRIG 85.0 07/26/2023   HDL 55.60 07/26/2023   LDLDIRECT 157.6 01/02/2009   LDLCALC 84 07/26/2023   ALT 19 07/26/2023   AST 19 07/26/2023   NA 140 07/26/2023   K 3.9 07/26/2023   CL 102 07/26/2023   CREATININE 0.80 07/26/2023   BUN 16 07/26/2023   CO2 31 07/26/2023   TSH 0.85 07/26/2023   PSA 2.95 07/26/2023   HGBA1C 6.0 07/26/2023   MICROALBUR <0.7 07/26/2023   Assessment/Plan:  Joshua Button Sr. is a 73 y.o. Black or African American [2] male with  has a past medical history of Acute pharyngitis (01/26/2009), ALLERGIC RHINITIS (08/07/2007), Allergy, ARTHRITIS (08/07/2007), BACK PAIN (09/15/2009), BENIGN PROSTATIC HYPERTROPHY (05/20/2008), Cataract, EATING DISORDER, HX OF (08/07/2007), ERECTILE DYSFUNCTION (02/18/2008), GERD (gastroesophageal reflux disease), HYPERLIPIDEMIA (08/07/2007), HYPERTENSION  (08/07/2007), NECK PAIN, RIGHT (09/15/2009), Neuromuscular disorder (HCC), OSTEOARTHRITIS, KNEES, BILATERAL (09/15/2009), SINUSITIS- ACUTE-NOS (04/30/2010), and Sleep apnea.  HLD (hyperlipidemia) Lab Results  Component Value Date   LDLCALC 84 07/26/2023   Stable, continue crestor  40 mg qd   Essential hypertension BP Readings from Last 3 Encounters:  07/26/23 (!) 160/86  07/05/23 (!) 148/72  04/27/23 (!) 144/75   uncontrolled, pt to continue medical treatment norvasc  5 every day, but increased avaopro 300 mg qd   Hyperglycemia Lab Results  Component Value Date   HGBA1C 6.0 07/26/2023   Stable, pt to continue current medical treatment  - diet, wt control   B12 deficiency Lab Results  Component Value Date   VITAMINB12 >1500 (H) 07/26/2023   Stable, cont oral replacement - b12 1000 mcg qd   Vitamin D  deficiency Last vitamin D  Lab Results  Component Value Date   VD25OH 35.35 07/26/2023   Low, to start oral replacement   DOE (dyspnea on exertion) Etiology unclear, exam benign, for echo, cxr, and Card Ct score  Followup: Return in about 6 months (around 01/26/2024).  Rosalia Colonel, MD 07/27/2023 7:39 PM [facility]

## 2023-07-26 NOTE — Patient Instructions (Signed)
 Ok to increase the avapro  to 300 mg per day  Please continue all other medications as before, and refills have been done if requested.  Please have the pharmacy call with any other refills you may need.  Please continue your efforts at being more active, low cholesterol diet, and weight control.  You are otherwise up to date with prevention measures today.  Please keep your appointments with your specialists as you may have planned  You will be contacted regarding the referral for: Echocardiogram, and Cardiac CT score  Please go to the XRAY Department in the first floor for the x-ray testing  Please go to the LAB at the blood drawing area for the tests to be done  You will be contacted by phone if any changes need to be made immediately.  Otherwise, you will receive a letter about your results with an explanation, but please check with MyChart first.  Please make an Appointment to return in 6 months, or sooner if needed

## 2023-07-26 NOTE — Progress Notes (Signed)
 The test results show that your current treatment is OK, as the tests are stable.  Please continue the same plan.  There is no other need for change of treatment or further evaluation based on these results, at this time.  thanks

## 2023-07-27 ENCOUNTER — Encounter: Payer: Self-pay | Admitting: Internal Medicine

## 2023-07-27 DIAGNOSIS — R0609 Other forms of dyspnea: Secondary | ICD-10-CM | POA: Insufficient documentation

## 2023-07-27 NOTE — Assessment & Plan Note (Signed)
 BP Readings from Last 3 Encounters:  07/26/23 (!) 160/86  07/05/23 (!) 148/72  04/27/23 (!) 144/75   uncontrolled, pt to continue medical treatment norvasc  5 every day, but increased avaopro 300 mg qd

## 2023-07-27 NOTE — Assessment & Plan Note (Signed)
 Etiology unclear, exam benign, for echo, cxr, and Card Ct score

## 2023-07-27 NOTE — Assessment & Plan Note (Signed)
 Last vitamin D  Lab Results  Component Value Date   VD25OH 35.35 07/26/2023   Low, to start oral replacement

## 2023-07-27 NOTE — Assessment & Plan Note (Signed)
 Lab Results  Component Value Date   HGBA1C 6.0 07/26/2023   Stable, pt to continue current medical treatment  - diet, wt control

## 2023-07-27 NOTE — Assessment & Plan Note (Signed)
 Lab Results  Component Value Date   LDLCALC 84 07/26/2023   Stable, continue crestor  40 mg qd

## 2023-07-27 NOTE — Assessment & Plan Note (Signed)
 Lab Results  Component Value Date   VITAMINB12 >1500 (H) 07/26/2023   Stable, cont oral replacement - b12 1000 mcg qd

## 2023-08-15 ENCOUNTER — Ambulatory Visit (HOSPITAL_BASED_OUTPATIENT_CLINIC_OR_DEPARTMENT_OTHER)
Admission: RE | Admit: 2023-08-15 | Discharge: 2023-08-15 | Disposition: A | Discharge status: Home / Self Care | Payer: Self-pay | Source: Ambulatory Visit | Attending: Internal Medicine | Admitting: Internal Medicine

## 2023-08-15 DIAGNOSIS — E7800 Pure hypercholesterolemia, unspecified: Secondary | ICD-10-CM | POA: Insufficient documentation

## 2023-08-15 DIAGNOSIS — I10 Essential (primary) hypertension: Secondary | ICD-10-CM | POA: Insufficient documentation

## 2023-08-15 DIAGNOSIS — R739 Hyperglycemia, unspecified: Secondary | ICD-10-CM | POA: Insufficient documentation

## 2023-08-18 ENCOUNTER — Other Ambulatory Visit: Payer: Self-pay | Admitting: Internal Medicine

## 2023-08-18 MED ORDER — ATORVASTATIN CALCIUM 80 MG PO TABS: 80.0000 mg | ORAL_TABLET | Freq: Every day | ORAL | 3 refills | Status: DC

## 2023-08-23 ENCOUNTER — Telehealth: Payer: Self-pay | Admitting: *Deleted

## 2023-08-23 NOTE — Telephone Encounter (Signed)
 Patient's colonoscopy has been cancelled.  Called patient and let him know.  He has had a CT calcium  score but is scheduled for ECHO on 7-3.  Once work up is complete we can reschedule his colonoscopy.  Patient expressed understanding

## 2023-08-23 NOTE — Telephone Encounter (Signed)
 Okay sorry to hear this. Jan can you help take him off schedule and let him know, and reschedule once his workup is done. Thank you

## 2023-08-23 NOTE — Telephone Encounter (Signed)
 Dr. General Kenner,  This pt is scheduled with you for a procedure tomorrow, June 19.  I have just become aware he is undergoing a w/u by his PMD for significant and increasing SOB.  Consequently, we will need to delay his procedure until after this w/u has been completed.  Best regards,  Cathryn Cobb

## 2023-08-24 ENCOUNTER — Encounter: Admitting: Gastroenterology

## 2023-08-28 ENCOUNTER — Encounter (HOSPITAL_BASED_OUTPATIENT_CLINIC_OR_DEPARTMENT_OTHER): Attending: Internal Medicine | Admitting: Internal Medicine

## 2023-08-28 DIAGNOSIS — I10 Essential (primary) hypertension: Secondary | ICD-10-CM | POA: Insufficient documentation

## 2023-08-28 DIAGNOSIS — Y838 Other surgical procedures as the cause of abnormal reaction of the patient, or of later complication, without mention of misadventure at the time of the procedure: Secondary | ICD-10-CM | POA: Insufficient documentation

## 2023-08-28 DIAGNOSIS — L97511 Non-pressure chronic ulcer of other part of right foot limited to breakdown of skin: Secondary | ICD-10-CM | POA: Insufficient documentation

## 2023-08-28 DIAGNOSIS — T8131XA Disruption of external operation (surgical) wound, not elsewhere classified, initial encounter: Secondary | ICD-10-CM | POA: Diagnosis present

## 2023-09-07 ENCOUNTER — Ambulatory Visit (HOSPITAL_COMMUNITY)
Admission: RE | Admit: 2023-09-07 | Discharge: 2023-09-07 | Disposition: A | Discharge status: Home / Self Care | Source: Ambulatory Visit | Attending: Cardiology | Admitting: Cardiology

## 2023-09-07 DIAGNOSIS — R0609 Other forms of dyspnea: Secondary | ICD-10-CM

## 2023-09-07 LAB — ECHOCARDIOGRAM COMPLETE
Area-P 1/2: 2.52 cm2
S' Lateral: 3 cm

## 2023-09-11 ENCOUNTER — Ambulatory Visit (HOSPITAL_BASED_OUTPATIENT_CLINIC_OR_DEPARTMENT_OTHER): Admitting: Internal Medicine

## 2023-09-12 ENCOUNTER — Telehealth: Payer: Self-pay

## 2023-09-12 NOTE — Telephone Encounter (Signed)
-----   Message from NULTY,[name] sent at 09/11/2023  3:12 PM EDT ----- Regarding: RE: reschedule colon Jan,  This pt is cleared for anesthetic care at [facility].  Thanks for reaching out and the incredible job you do for [facility] and supporting Dr. Myrle.  Best regards,  Norleen EMERSON Schillings ----- Message ----- From: Edmundo Clarita HERO, CMA Sent: 09/11/2023  12:31 PM EDT To: Norleen Schillings, CRNA Subject: FW: reschedule colon                           Walterine Norleen -  looks like patient had ECHO.  Ok to reschedule colonoscopy in the [facility] w/ [facility] ? Thanks, Jan ----- Message ----- From: Edmundo Clarita HERO, CMA Sent: 09/11/2023  12:00 AM EDT To: Clarita HERO Edmundo, CMA Subject: reschedule colon                               Ask Norleen Schillings to review chart to see if OK to reschedule colon with [facility] in the [facility]  Has he completed work up for SOB - dyspnea on exertion?  Scheduled for ECHO on 7-3

## 2023-09-12 NOTE — Telephone Encounter (Signed)
 Patient seen in the office on 07-05-23 Dx: IDA, Hx of CP, alt BH.  Called patient and got him scheduled for colonoscopy in Sept and telephone PV on 8-25. Patient requests instructions send to MyChart prior to call

## 2023-10-02 ENCOUNTER — Telehealth: Payer: Self-pay

## 2023-10-02 DIAGNOSIS — D509 Iron deficiency anemia, unspecified: Secondary | ICD-10-CM

## 2023-10-02 NOTE — Telephone Encounter (Signed)
-----   Message from [facility] Clarita H sent at 07/05/2023  9:11 AM EDT ----- Regarding: labs Due for labs in early August:  CBC and IBC/Ferritin

## 2023-10-02 NOTE — Telephone Encounter (Signed)
Labs entered. MyChart message to patient to go to the lab one day this week or next

## 2023-10-05 NOTE — Telephone Encounter (Signed)
 Called and spoke to patient.  He will go to the lab in the next week

## 2023-10-12 ENCOUNTER — Other Ambulatory Visit (INDEPENDENT_AMBULATORY_CARE_PROVIDER_SITE_OTHER)

## 2023-10-12 DIAGNOSIS — D509 Iron deficiency anemia, unspecified: Secondary | ICD-10-CM | POA: Diagnosis not present

## 2023-10-12 LAB — CBC WITH DIFFERENTIAL/PLATELET
Basophils Absolute: 0 K/uL (ref 0.0–0.1)
Basophils Relative: 0.5 % (ref 0.0–3.0)
Eosinophils Absolute: 0.3 K/uL (ref 0.0–0.7)
Eosinophils Relative: 3.8 % (ref 0.0–5.0)
HCT: 40.5 % (ref 39.0–52.0)
Hemoglobin: 13.5 g/dL (ref 13.0–17.0)
Lymphocytes Relative: 12 % (ref 12.0–46.0)
Lymphs Abs: 0.9 K/uL (ref 0.7–4.0)
MCHC: 33.4 g/dL (ref 30.0–36.0)
MCV: 88.9 fl (ref 78.0–100.0)
Monocytes Absolute: 0.6 K/uL (ref 0.1–1.0)
Monocytes Relative: 9.1 % (ref 3.0–12.0)
Neutro Abs: 5.3 K/uL (ref 1.4–7.7)
Neutrophils Relative %: 74.6 % (ref 43.0–77.0)
Platelets: 198 K/uL (ref 150.0–400.0)
RBC: 4.55 Mil/uL (ref 4.22–5.81)
RDW: 14.5 % (ref 11.5–15.5)
WBC: 7.1 K/uL (ref 4.0–10.5)

## 2023-10-13 ENCOUNTER — Ambulatory Visit: Payer: Self-pay | Admitting: Gastroenterology

## 2023-10-13 LAB — IBC + FERRITIN
Ferritin: 17.7 ng/mL — ABNORMAL LOW (ref 22.0–322.0)
Iron: 61 ug/dL (ref 42–165)
Saturation Ratios: 18.8 % — ABNORMAL LOW (ref 20.0–50.0)
TIBC: 324.8 ug/dL (ref 250.0–450.0)
Transferrin: 232 mg/dL (ref 212.0–360.0)

## 2023-10-27 ENCOUNTER — Encounter: Payer: Self-pay | Admitting: Internal Medicine

## 2023-10-27 ENCOUNTER — Ambulatory Visit (INDEPENDENT_AMBULATORY_CARE_PROVIDER_SITE_OTHER): Admitting: Internal Medicine

## 2023-10-27 VITALS — BP 130/84 | HR 73 | Temp 98.2°F | Ht 71.0 in | Wt 280.6 lb

## 2023-10-27 DIAGNOSIS — I10 Essential (primary) hypertension: Secondary | ICD-10-CM

## 2023-10-27 DIAGNOSIS — G5603 Carpal tunnel syndrome, bilateral upper limbs: Secondary | ICD-10-CM | POA: Diagnosis not present

## 2023-10-27 DIAGNOSIS — E559 Vitamin D deficiency, unspecified: Secondary | ICD-10-CM

## 2023-10-27 DIAGNOSIS — J019 Acute sinusitis, unspecified: Secondary | ICD-10-CM | POA: Diagnosis not present

## 2023-10-27 MED ORDER — HYDROCODONE BIT-HOMATROP MBR 5-1.5 MG/5ML PO SOLN: 5.0000 mL | Freq: Four times a day (QID) | ORAL | 0 refills | Status: AC | PRN

## 2023-10-27 MED ORDER — DOXYCYCLINE HYCLATE 100 MG PO TABS: 100.0000 mg | ORAL_TABLET | Freq: Two times a day (BID) | ORAL | 0 refills | Status: AC

## 2023-10-27 NOTE — Assessment & Plan Note (Signed)
 Mild to mod, for antibx course doxcycline 100 bid course, cough med prn,,  to f/u any worsening symptoms or concerns

## 2023-10-27 NOTE — Assessment & Plan Note (Signed)
 BP Readings from Last 3 Encounters:  10/27/23 130/84  07/26/23 (!) 160/86  07/05/23 (!) 148/72   Stable, pt to continue medical treatment norvasc  5 every day, avapro  300 mg qd

## 2023-10-27 NOTE — Patient Instructions (Signed)
 Please take all new medication as prescribed - the antibiotic, and cough medicine  Please wear the bilateral wrist splints at night only, and let us  know if you would want further referral to Neurology for testing  Please continue all other medications as before, and refills have been done if requested.  Please have the pharmacy call with any other refills you may need.  Please keep your appointments with your specialists as you may have planned

## 2023-10-27 NOTE — Assessment & Plan Note (Signed)
 Last vitamin D  Lab Results  Component Value Date   VD25OH 35.35 07/26/2023   Low, to start oral replacement

## 2023-10-27 NOTE — Progress Notes (Signed)
 Patient ID: Joshua Cathlyn Ligas Sr., male   DOB: [birth year], 73 y.o.   MRN: [id number]        Chief Complaint: follow up sinusitis, bilateral CTS, htn, low vit d       HPI:  Joshua Cathlyn Ligas Sr. is a 73 y.o. male  Here with 2-3 days acute onset fever, facial pain, pressure, headache, general weakness and malaise, and greenish d/c, with mild ST and cough, but pt denies chest pain, wheezing, increased sob or doe, orthopnea, PND, increased LE swelling, palpitations, dizziness or syncope.  Also has recent 2 mo worsening bilateral hand pain and numbness with using more recently.  Seems worse at night if lays on one side.  No loss grip strength, neck or elbow pain.   Pt denies polydipsia, polyuria, or new focal neuro s/s.          Wt Readings from Last 3 Encounters:  10/27/23 280 lb 9.6 oz (127.3 kg)  07/26/23 280 lb (127 kg)  07/05/23 284 lb (128.8 kg)   BP Readings from Last 3 Encounters:  10/27/23 130/84  07/26/23 (!) 160/86  07/05/23 (!) 148/72         Past Medical History:  Diagnosis Date   Acute pharyngitis 01/26/2009   ALLERGIC RHINITIS 08/07/2007   Allergy    ARTHRITIS 08/07/2007   BACK PAIN 09/15/2009   BENIGN PROSTATIC HYPERTROPHY 05/20/2008   Cataract    removed bilaterally    EATING DISORDER, HX OF 08/07/2007   ERECTILE DYSFUNCTION 02/18/2008   GERD (gastroesophageal reflux disease)    HYPERLIPIDEMIA 08/07/2007   HYPERTENSION 08/07/2007   NECK PAIN, RIGHT 09/15/2009   Neuromuscular disorder (HCC)    carpal tunnel    OSTEOARTHRITIS, KNEES, BILATERAL 09/15/2009   SINUSITIS- ACUTE-NOS 04/30/2010   Sleep apnea    weas cpap    Past Surgical History:  Procedure Laterality Date   ANKLE FUSION Right 04/27/2023   Procedure: SUBTALAR AND TALONAVICULAR ARTHRODESIS ANKLE;  Surgeon: Kit Rush, MD;  Location: [facility];  Service: Orthopedics;  Laterality: Right;   COLONOSCOPY     GASTRIC BYPASS  2006   GASTROCNEMIUS RECESSION Right 04/27/2023   Procedure:  GASTROCNEMIUS RECESSION;  Surgeon: Kit Rush, MD;  Location: [facility];  Service: Orthopedics;  Laterality: Right;   Left foot surgery  2008   Lower back  2004   POLYPECTOMY     Right knee arthroscopy  1994   UPPER GASTROINTESTINAL ENDOSCOPY      reports that he has never smoked. He has never used smokeless tobacco. He reports current alcohol use. He reports that he does not use drugs. family history includes Heart disease in his father; Hyperlipidemia in his father; Hypertension in his father. Allergies  Allergen Reactions   Ace Inhibitors     REACTION: tongue swelling   Current Outpatient Medications on File Prior to Visit  Medication Sig Dispense Refill   acetaminophen  (TYLENOL  8 HOUR) 650 MG CR tablet 4 tablets twice a day by oral route.     amLODipine  (NORVASC ) 5 MG tablet Take 1 tablet (5 mg total) by mouth daily. 90 tablet 3   atorvastatin  (LIPITOR) 80 MG tablet Take 1 tablet (80 mg total) by mouth daily. 90 tablet 3   cetirizine  (ZYRTEC ) 10 MG tablet Take 10 mg by mouth daily.     Cholecalciferol (VITAMIN D3 PO) Take 5,000 Int'l Units by mouth.     Cholecalciferol 250 MCG (10000 UT) TABS      diclofenac  Sodium (  PENNSAID ) 2 % SOLN      docusate sodium  (COLACE) 100 MG capsule Take 1 capsule (100 mg total) by mouth 2 (two) times daily. While taking narcotic pain medicine. 30 capsule 0   FISH OIL-KRILL OIL PO 500 mg.     furosemide  (LASIX ) 20 MG tablet 1 tab by mouth in the AM, and 1 in the PM as needed for persistent swelling 180 tablet 3   irbesartan  (AVAPRO ) 300 MG tablet Take 1 tablet (300 mg total) by mouth daily. 90 tablet 3   meloxicam  (MOBIC ) 7.5 MG tablet TAKE 1 TABLET BY MOUTH TWICE A DAY FOR 2 WEEKS AND THEN AS NEEDED     Multiple Vitamins-Minerals (MULTIVITAMINS THER. W/MINERALS) TABS tablet Take 1 tablet by mouth daily.     Na Sulfate-K Sulfate-Mg Sulfate concentrate (SUPREP) 17.5-3.13-1.6 GM/177ML SOLN Take 1 kit by mouth once.     omeprazole   (PRILOSEC) 40 MG capsule Take 40 mg by mouth daily.     POLY-IRON  150 150 MG capsule TAKE 2 CAPSULES BY MOUTH EVERY DAY 180 capsule 3   Probiotic Product (PROBIOTIC DAILY PO) Take by mouth. [facility]     senna (SENOKOT) 8.6 MG TABS tablet Take 2 tablets (17.2 mg total) by mouth 2 (two) times daily. 30 tablet 0   tadalafil  (CIALIS ) 20 MG tablet Take 1 tablet (20 mg total) by mouth daily as needed. 10 tablet 11   tamsulosin  (FLOMAX ) 0.4 MG CAPS capsule Take 2 capsules (0.8 mg total) by mouth daily. 90 capsule 3   tiZANidine  (ZANAFLEX ) 2 MG tablet TAKE 1 TABLET BY MOUTH EVERYDAY AT BEDTIME 90 tablet 1   traMADol  (ULTRAM ) 50 MG tablet TAKE 1 TABLET BY MOUTH EVERY 6 HOURS AS NEEDED FOR MODERATE PAIN 120 tablet 2   Turmeric 1053 MG TABS turmeric     No current facility-administered medications on file prior to visit.        ROS:  All others reviewed and negative.  Objective        PE:  BP 130/84   Pulse 73   Temp 98.2 F (36.8 C) (Oral)   Ht 5' 11 (1.803 m)   Wt 280 lb 9.6 oz (127.3 kg)   SpO2 97%   BMI 39.14 kg/m                 Constitutional: Pt appears in NAD               HENT: Head: NCAT.                Right Ear: External ear normal.                 Left Ear: External ear normal. Bilat tm's with mild erythema.  Max sinus areas mild tender.  Pharynx with mild erythema, no exudate                 Eyes: . Pupils are equal, round, and reactive to light. Conjunctivae and EOM are normal               Nose: without d/c or deformity               Neck: Neck supple. Gross normal ROM               Cardiovascular: Normal rate and regular rhythm.                 Pulmonary/Chest: Effort normal and breath sounds without rales or  wheezing.                               Neurological: Pt is alert. At baseline orientation, motor grossly intact               Skin: Skin is warm. No rashes, no other new lesions, LE edema - none               Psychiatric: Pt behavior is normal without  agitation   Micro: none  Cardiac tracings I have personally interpreted today:  none  Pertinent Radiological findings (summarize): none   Lab Results  Component Value Date   WBC 7.1 10/12/2023   HGB 13.5 10/12/2023   HCT 40.5 10/12/2023   PLT 198.0 10/12/2023   GLUCOSE 85 07/26/2023   CHOL 157 07/26/2023   TRIG 85.0 07/26/2023   HDL 55.60 07/26/2023   LDLDIRECT 157.6 01/02/2009   LDLCALC 84 07/26/2023   ALT 19 07/26/2023   AST 19 07/26/2023   NA 140 07/26/2023   K 3.9 07/26/2023   CL 102 07/26/2023   CREATININE 0.80 07/26/2023   BUN 16 07/26/2023   CO2 31 07/26/2023   TSH 0.85 07/26/2023   PSA 2.95 07/26/2023   HGBA1C 6.0 07/26/2023   MICROALBUR <0.7 07/26/2023   Assessment/Plan:  Joshua Cathlyn Ligas Sr. is a 73 y.o. Black or African American [2] male with  has a past medical history of Acute pharyngitis (01/26/2009), ALLERGIC RHINITIS (08/07/2007), Allergy, ARTHRITIS (08/07/2007), BACK PAIN (09/15/2009), BENIGN PROSTATIC HYPERTROPHY (05/20/2008), Cataract, EATING DISORDER, HX OF (08/07/2007), ERECTILE DYSFUNCTION (02/18/2008), GERD (gastroesophageal reflux disease), HYPERLIPIDEMIA (08/07/2007), HYPERTENSION (08/07/2007), NECK PAIN, RIGHT (09/15/2009), Neuromuscular disorder (HCC), OSTEOARTHRITIS, KNEES, BILATERAL (09/15/2009), SINUSITIS- ACUTE-NOS (04/30/2010), and Sleep apnea.  Essential hypertension BP Readings from Last 3 Encounters:  10/27/23 130/84  07/26/23 (!) 160/86  07/05/23 (!) 148/72   Stable, pt to continue medical treatment norvasc  5 every day, avapro  300 mg qd   Vitamin D  deficiency Last vitamin D  Lab Results  Component Value Date   VD25OH 35.35 07/26/2023   Low, to start oral replacement   Acute sinus infection Mild to mod, for antibx course doxcycline 100 bid course, cough med prn,,  to f/u any worsening symptoms or concerns  Bilateral carpal tunnel syndrome With recent worsening, for bilateral wrist splints, and pt will call if he decides to  go forward with neurology referral possible [facility]  Followup: Return in about 13 weeks (around 01/26/2024).  Lynwood Rush, MD 10/27/2023 8:47 PM [facility]

## 2023-10-27 NOTE — Assessment & Plan Note (Signed)
 With recent worsening, for bilateral wrist splints, and pt will call if he decides to go forward with neurology referral possible [facility]

## 2023-10-30 ENCOUNTER — Ambulatory Visit (AMBULATORY_SURGERY_CENTER)

## 2023-10-30 DIAGNOSIS — Z9884 Bariatric surgery status: Secondary | ICD-10-CM

## 2023-10-30 DIAGNOSIS — D509 Iron deficiency anemia, unspecified: Secondary | ICD-10-CM

## 2023-10-30 DIAGNOSIS — R194 Change in bowel habit: Secondary | ICD-10-CM

## 2023-10-30 DIAGNOSIS — Z860100 Personal history of colon polyps, unspecified: Secondary | ICD-10-CM

## 2023-10-30 NOTE — Progress Notes (Signed)

## 2023-11-10 ENCOUNTER — Encounter: Payer: Self-pay | Admitting: Gastroenterology

## 2023-11-10 ENCOUNTER — Ambulatory Visit: Admitting: Gastroenterology

## 2023-11-10 VITALS — BP 113/62 | HR 73 | Temp 97.9°F | Resp 12

## 2023-11-10 DIAGNOSIS — D122 Benign neoplasm of ascending colon: Secondary | ICD-10-CM | POA: Diagnosis not present

## 2023-11-10 DIAGNOSIS — K5730 Diverticulosis of large intestine without perforation or abscess without bleeding: Secondary | ICD-10-CM

## 2023-11-10 DIAGNOSIS — K648 Other hemorrhoids: Secondary | ICD-10-CM

## 2023-11-10 DIAGNOSIS — Z1211 Encounter for screening for malignant neoplasm of colon: Secondary | ICD-10-CM

## 2023-11-10 DIAGNOSIS — Z860100 Personal history of colon polyps, unspecified: Secondary | ICD-10-CM

## 2023-11-10 DIAGNOSIS — K6289 Other specified diseases of anus and rectum: Secondary | ICD-10-CM

## 2023-11-10 DIAGNOSIS — Z860101 Personal history of adenomatous and serrated colon polyps: Secondary | ICD-10-CM

## 2023-11-10 DIAGNOSIS — K642 Third degree hemorrhoids: Secondary | ICD-10-CM

## 2023-11-10 DIAGNOSIS — D123 Benign neoplasm of transverse colon: Secondary | ICD-10-CM

## 2023-11-10 MED ORDER — SODIUM CHLORIDE 0.9 % IV SOLN: 500.0000 mL | Freq: Once | INTRAVENOUS | Status: DC

## 2023-11-10 NOTE — Progress Notes (Signed)
 Sedate, gd SR, tolerated procedure well, VSS, report to RN

## 2023-11-10 NOTE — Patient Instructions (Signed)
-  Handout on polyps, hemorrhoids and diverticulosis provided -await pathology results -repeat colonoscopy for surveillance recommended. Date to be determined when pathology result become available   -Continue present medications    YOU HAD AN ENDOSCOPIC PROCEDURE TODAY AT THE  ENDOSCOPY CENTER:   Refer to the procedure report that was given to you for any specific questions about what was found during the examination.  If the procedure report does not answer your questions, please call your gastroenterologist to clarify.  If you requested that your care partner not be given the details of your procedure findings, then the procedure report has been included in a sealed envelope for you to review at your convenience later.  YOU SHOULD EXPECT: Some feelings of bloating in the abdomen. Passage of more gas than usual.  Walking can help get rid of the air that was put into your GI tract during the procedure and reduce the bloating. If you had a lower endoscopy (such as a colonoscopy or flexible sigmoidoscopy) you may notice spotting of blood in your stool or on the toilet paper. If you underwent a bowel prep for your procedure, you may not have a normal bowel movement for a few days.  Please Note:  You might notice some irritation and congestion in your nose or some drainage.  This is from the oxygen used during your procedure.  There is no need for concern and it should clear up in a day or so.  SYMPTOMS TO REPORT IMMEDIATELY:  Following lower endoscopy (colonoscopy or flexible sigmoidoscopy):  Excessive amounts of blood in the stool  Significant tenderness or worsening of abdominal pains  Swelling of the abdomen that is new, acute  Fever of 100F or higher   For urgent or emergent issues, a gastroenterologist can be reached at any hour by calling [phone]. Do not use MyChart messaging for urgent concerns.    DIET:  We do recommend a small meal at first, but then you may proceed to  your regular diet.  Drink plenty of fluids but you should avoid alcoholic beverages for 24 hours.  ACTIVITY:  You should plan to take it easy for the rest of today and you should NOT DRIVE or use heavy machinery until tomorrow (because of the sedation medicines used during the test).    FOLLOW UP: Our staff will call the number listed on your records the next business day following your procedure.  We will call around 7:15- 8:00 am to check on you and address any questions or concerns that you may have regarding the information given to you following your procedure. If we do not reach you, we will leave a message.     If any biopsies were taken you will be contacted by phone or by letter within the next 1-3 weeks.  Please call us at [phone] if you have not heard about the biopsies in 3 weeks.    SIGNATURES/CONFIDENTIALITY: You and/or your care partner have signed paperwork which will be entered into your electronic medical record.  These signatures attest to the fact that that the information above on your After Visit Summary has been reviewed and is understood.  Full responsibility of the confidentiality of this discharge information lies with you and/or your care-partner.  

## 2023-11-10 NOTE — Op Note (Signed)
 [facility] Patient Name: Joshua Burnett Procedure Date: 11/10/2023 7:59 AM MRN: [id number] Endoscopist: Elspeth P. Leigh , MD, [id number] Age: 73 Referring MD:  Date of Birth: Sep 14, 1950 Gender: Male Account #: [id number] Procedure:                Colonoscopy Indications:              High risk colon cancer surveillance: Personal                            history of colonic polyps - mutliple adenomas                            removed in 2022 Medicines:                Monitored Anesthesia Care Procedure:                Pre-Anesthesia Assessment:                           - Prior to the procedure, a History and Physical                            was performed, and patient medications and                            allergies were reviewed. The patient's tolerance of                            previous anesthesia was also reviewed. The risks                            and benefits of the procedure and the sedation                            options and risks were discussed with the patient.                            All questions were answered, and informed consent                            was obtained. Prior Anticoagulants: The patient has                            taken no anticoagulant or antiplatelet agents. ASA                            Grade Assessment: III - A patient with severe                            systemic disease. After reviewing the risks and                            benefits, the patient was deemed in satisfactory  condition to undergo the procedure.                           After obtaining informed consent, the colonoscope                            was passed under direct vision. Throughout the                            procedure, the patient's blood pressure, pulse, and                            oxygen saturations were monitored continuously. The                            CF [id number] #[id number] was introduced through  the anus                            and advanced to the the cecum, identified by                            appendiceal orifice and ileocecal valve. The                            colonoscopy was performed without difficulty. The                            patient tolerated the procedure well. The quality                            of the bowel preparation was good. The ileocecal                            valve, appendiceal orifice, and rectum were                            photographed. Scope In: 8:01:47 AM Scope Out: 8:20:13 AM Scope Withdrawal Time: 0 hours 16 minutes 0 seconds  Total Procedure Duration: 0 hours 18 minutes 26 seconds  Findings:                 Hemorrhoids were found on perianal exam (grade III).                           A 3 mm polyp was found in the ascending colon. The                            polyp was sessile. The polyp was removed with a                            cold snare. Resection and retrieval were complete.                           Two sessile polyps were found in the transverse  colon. The polyps were 3 to 4 mm in size. These                            polyps were removed with a cold snare. Resection                            and retrieval were complete.                           Multiple diverticula were found in the transverse                            colon and left colon.                           Internal hemorrhoids were found during retroflexion.                           Anal papilla(e) were hypertrophied.                           The exam was otherwise without abnormality. Complications:            No immediate complications. Estimated blood loss:                            Minimal. Estimated Blood Loss:     Estimated blood loss was minimal. Impression:               - Hemorrhoids found on perianal exam.                           - One 3 mm polyp in the ascending colon, removed                            with a  cold snare. Resected and retrieved.                           - Two 3 to 4 mm polyps in the transverse colon,                            removed with a cold snare. Resected and retrieved.                           - Diverticulosis in the transverse colon and in the                            left colon.                           - Internal hemorrhoids.                           - Anal papilla(e) were hypertrophied.                           -  The examination was otherwise normal. Recommendation:           - Patient has a contact number available for                            emergencies. The signs and symptoms of potential                            delayed complications were discussed with the                            patient. Return to normal activities tomorrow.                            Written discharge instructions were provided to the                            patient.                           - Resume previous diet.                           - Continue present medications.                           - Await pathology results. Elspeth P. Veneta Sliter, MD 11/10/2023 8:27:39 AM This report has been signed electronically.

## 2023-11-10 NOTE — Progress Notes (Signed)
 [facility] History and Physical   Primary Care Physician:  Norleen Lynwood ORN, MD   Reason for Procedure:   History of polyps  Plan:    colonoscopy     HPI: Joshua Grand Sr. is a 73 y.o. male  here for colonoscopy surveillance - 10 adenomas removed 06/2020.    Patient denies any bowel symptoms at this time.  Otherwise feels well without any cardiopulmonary symptoms.   I have discussed risks / benefits of anesthesia and endoscopic procedure with Joshua Cathlyn Ligas Sr. and they wish to proceed with the exams as outlined today.    Past Medical History:  Diagnosis Date   Acute pharyngitis 01/26/2009   ALLERGIC RHINITIS 08/07/2007   Allergy    ARTHRITIS 08/07/2007   BACK PAIN 09/15/2009   BENIGN PROSTATIC HYPERTROPHY 05/20/2008   Cataract    removed bilaterally    EATING DISORDER, HX OF 08/07/2007   ERECTILE DYSFUNCTION 02/18/2008   GERD (gastroesophageal reflux disease)    HYPERLIPIDEMIA 08/07/2007   HYPERTENSION 08/07/2007   NECK PAIN, RIGHT 09/15/2009   Neuromuscular disorder (HCC)    carpal tunnel    OSTEOARTHRITIS, KNEES, BILATERAL 09/15/2009   SINUSITIS- ACUTE-NOS 04/30/2010   Sleep apnea    wears cpap    Past Surgical History:  Procedure Laterality Date   ANKLE FUSION Right 04/27/2023   Procedure: SUBTALAR AND TALONAVICULAR ARTHRODESIS ANKLE;  Surgeon: Kit Norleen, MD;  Location: [facility];  Service: Orthopedics;  Laterality: Right;   COLONOSCOPY     GASTRIC BYPASS  2006   GASTROCNEMIUS RECESSION Right 04/27/2023   Procedure: GASTROCNEMIUS RECESSION;  Surgeon: Kit Norleen, MD;  Location:  SURGERY CENTER;  Service: Orthopedics;  Laterality: Right;   Left foot surgery  2008   Lower back  2004   POLYPECTOMY     Right knee arthroscopy  1994   UPPER GASTROINTESTINAL ENDOSCOPY      Prior to Admission medications   Medication Sig Start Date End Date Taking? Authorizing Provider  acetaminophen  (TYLENOL  8 HOUR) 650 MG  CR tablet 4 tablets twice a day by oral route.   Yes [provider]  amLODipine  (NORVASC ) 5 MG tablet Take 1 tablet (5 mg total) by mouth daily. 03/10/23  Yes Norleen Lynwood ORN, MD  atorvastatin  (LIPITOR) 80 MG tablet Take 1 tablet (80 mg total) by mouth daily. 08/18/23  Yes Norleen Lynwood ORN, MD  cetirizine  (ZYRTEC ) 10 MG tablet Take 10 mg by mouth daily.   Yes [provider]  Cholecalciferol (VITAMIN D3 PO) Take 5,000 Int'l Units by mouth.   Yes [provider]  Cholecalciferol 250 MCG (10000 UT) TABS    Yes [provider]  doxycycline  (VIBRA -TABS) 100 MG tablet Take 1 tablet (100 mg total) by mouth 2 (two) times daily. 10/27/23  Yes Norleen Lynwood ORN, MD  FISH OIL-KRILL OIL PO 500 mg.   Yes [provider]  furosemide  (LASIX ) 20 MG tablet 1 tab by mouth in the AM, and 1 in the PM as needed for persistent swelling 10/28/22  Yes Norleen Lynwood ORN, MD  irbesartan  (AVAPRO ) 300 MG tablet Take 1 tablet (300 mg total) by mouth daily. 07/26/23  Yes Norleen Lynwood ORN, MD  meloxicam  (MOBIC ) 7.5 MG tablet TAKE 1 TABLET BY MOUTH TWICE A DAY FOR 2 WEEKS AND THEN AS NEEDED 08/17/23  Yes [provider]  Multiple Vitamins-Minerals (MULTIVITAMINS THER. W/MINERALS) TABS tablet Take 1 tablet by mouth daily.   Yes [provider]  Na  Sulfate-K Sulfate-Mg Sulfate concentrate (SUPREP) 17.5-3.13-1.6 GM/177ML SOLN Take 1 kit by mouth once. 07/05/23  Yes [provider]  omeprazole  (PRILOSEC) 40 MG capsule Take 40 mg by mouth daily.   Yes [provider]  POLY-IRON  150 150 MG capsule TAKE 2 CAPSULES BY MOUTH EVERY DAY 06/28/23  Yes Norleen Lynwood ORN, MD  Probiotic Product (PROBIOTIC DAILY PO) Take by mouth. [facility] colon [facility]   Yes [provider]  tamsulosin  (FLOMAX ) 0.4 MG CAPS capsule Take 2 capsules (0.8 mg total) by mouth daily. 10/28/22  Yes Norleen Lynwood ORN, MD  tiZANidine  (ZANAFLEX ) 2 MG tablet TAKE 1 TABLET BY MOUTH EVERYDAY AT BEDTIME 01/15/20  Yes  Norleen Lynwood ORN, MD  traMADol  (ULTRAM ) 50 MG tablet TAKE 1 TABLET BY MOUTH EVERY 6 HOURS AS NEEDED FOR MODERATE PAIN 07/13/23  Yes Norleen Lynwood ORN, MD  Turmeric 1053 MG TABS turmeric   Yes [provider]  diclofenac  Sodium (PENNSAID ) 2 % SOLN  10/20/16   [provider]  docusate sodium  (COLACE) 100 MG capsule Take 1 capsule (100 mg total) by mouth 2 (two) times daily. While taking narcotic pain medicine. Patient not taking: No sig reported 04/27/23   Aniceto Eva Grebe, PA-C  senna (SENOKOT) 8.6 MG TABS tablet Take 2 tablets (17.2 mg total) by mouth 2 (two) times daily. Patient not taking: No sig reported 04/27/23   Aniceto Eva Grebe, PA-C  tadalafil  (CIALIS ) 20 MG tablet Take 1 tablet (20 mg total) by mouth daily as needed. Patient not taking: No sig reported 10/28/22   Norleen Lynwood ORN, MD    Current Outpatient Medications  Medication Sig Dispense Refill   acetaminophen  (TYLENOL  8 HOUR) 650 MG CR tablet 4 tablets twice a day by oral route.     amLODipine  (NORVASC ) 5 MG tablet Take 1 tablet (5 mg total) by mouth daily. 90 tablet 3   atorvastatin  (LIPITOR) 80 MG tablet Take 1 tablet (80 mg total) by mouth daily. 90 tablet 3   cetirizine  (ZYRTEC ) 10 MG tablet Take 10 mg by mouth daily.     Cholecalciferol (VITAMIN D3 PO) Take 5,000 Int'l Units by mouth.     Cholecalciferol 250 MCG (10000 UT) TABS      doxycycline  (VIBRA -TABS) 100 MG tablet Take 1 tablet (100 mg total) by mouth 2 (two) times daily. 20 tablet 0   FISH OIL-KRILL OIL PO 500 mg.     furosemide  (LASIX ) 20 MG tablet 1 tab by mouth in the AM, and 1 in the PM as needed for persistent swelling 180 tablet 3   irbesartan  (AVAPRO ) 300 MG tablet Take 1 tablet (300 mg total) by mouth daily. 90 tablet 3   meloxicam  (MOBIC ) 7.5 MG tablet TAKE 1 TABLET BY MOUTH TWICE A DAY FOR 2 WEEKS AND THEN AS NEEDED     Multiple Vitamins-Minerals (MULTIVITAMINS THER. W/MINERALS) TABS tablet Take 1 tablet by mouth daily.     Na Sulfate-K Sulfate-Mg  Sulfate concentrate (SUPREP) 17.5-3.13-1.6 GM/177ML SOLN Take 1 kit by mouth once.     omeprazole  (PRILOSEC) 40 MG capsule Take 40 mg by mouth daily.     POLY-IRON  150 150 MG capsule TAKE 2 CAPSULES BY MOUTH EVERY DAY 180 capsule 3   Probiotic Product (PROBIOTIC DAILY PO) Take by mouth. [facility] colon [facility]     tamsulosin  (FLOMAX ) 0.4 MG CAPS capsule Take 2 capsules (0.8 mg total) by mouth daily. 90 capsule 3   tiZANidine  (ZANAFLEX ) 2 MG tablet TAKE 1 TABLET BY MOUTH EVERYDAY  AT BEDTIME 90 tablet 1   traMADol  (ULTRAM ) 50 MG tablet TAKE 1 TABLET BY MOUTH EVERY 6 HOURS AS NEEDED FOR MODERATE PAIN 120 tablet 2   Turmeric 1053 MG TABS turmeric     diclofenac  Sodium (PENNSAID ) 2 % SOLN  (Patient not taking: No sig reported)     docusate sodium  (COLACE) 100 MG capsule Take 1 capsule (100 mg total) by mouth 2 (two) times daily. While taking narcotic pain medicine. (Patient not taking: No sig reported) 30 capsule 0   senna (SENOKOT) 8.6 MG TABS tablet Take 2 tablets (17.2 mg total) by mouth 2 (two) times daily. (Patient not taking: No sig reported) 30 tablet 0   tadalafil  (CIALIS ) 20 MG tablet Take 1 tablet (20 mg total) by mouth daily as needed. (Patient not taking: No sig reported) 10 tablet 11   Current Facility-Administered Medications  Medication Dose Route Frequency Provider Last Rate Last Admin   0.9 %  sodium chloride  infusion  500 mL Intravenous Once Gerrett Loman, Elspeth SQUIBB, MD        Allergies as of 11/10/2023 - Review Complete 11/10/2023  Allergen Reaction Noted   Ace inhibitors Other (See Comments) 08/07/2007    Family History  Problem Relation Age of Onset   Hyperlipidemia Father    Heart disease Father    Hypertension Father    Colon polyps Neg Hx    Esophageal cancer Neg Hx    Rectal cancer Neg Hx    Stomach cancer Neg Hx    Pancreatic cancer Neg Hx    Colon cancer Neg Hx     Social History   Socioeconomic History   Marital status: Married    Spouse name: Barnie    Number of children: 2   Years of education: college   Highest education level: Some college, no degree  Occupational History   Occupation: Event organiser  Tobacco Use   Smoking status: Never   Smokeless tobacco: Never  Vaping Use   Vaping status: Never Used  Substance and Sexual Activity   Alcohol use: Yes    Comment: occasional beer    Drug use: No   Sexual activity: Not Currently  Other Topics Concern   Not on file  Social History Narrative   Lives with wife   Social Drivers of Health   Financial Resource Strain: Low Risk  (07/26/2023)   Overall Financial Resource Strain (CARDIA)    Difficulty of Paying Living Expenses: Not hard at all  Food Insecurity: No Food Insecurity (07/26/2023)   Hunger Vital Sign    Worried About Running Out of Food in the Last Year: Never true    Ran Out of Food in the Last Year: Never true  Transportation Needs: No Transportation Needs (07/26/2023)   PRAPARE - Administrator, Civil Service (Medical): No    Lack of Transportation (Non-Medical): No  Physical Activity: Insufficiently Active (07/26/2023)   Exercise Vital Sign    Days of Exercise per Week: 7 days    Minutes of Exercise per Session: 20 min  Stress: No Stress Concern Present (07/26/2023)   [employer] of Occupational Health - Occupational Stress Questionnaire    Feeling of Stress : Not at all  Social Connections: Socially Integrated (07/26/2023)   Social Connection and Isolation Panel    Frequency of Communication with Friends and Family: More than three times a week    Frequency of Social Gatherings with Friends and Family: Three times a week    Attends Religious  Services: More than 4 times per year    Active Member of Clubs or Organizations: Yes    Attends Banker Meetings: More than 4 times per year    Marital Status: Married  Catering manager Violence: Not At Risk (01/30/2023)   Humiliation, Afraid, Rape, and Kick questionnaire    Fear of Current  or Ex-Partner: No    Emotionally Abused: No    Physically Abused: No    Sexually Abused: No    Review of Systems: All other review of systems negative except as mentioned in the HPI.  Physical Exam: Vital signs BP (!) 160/77   Pulse 80   Temp 97.9 F (36.6 C) (Temporal)   SpO2 93%   General:   Alert,  Well-developed, pleasant and cooperative in NAD Lungs:  Clear throughout to auscultation.   Heart:  Regular rate and rhythm Abdomen:  Soft, nontender and nondistended.   Neuro/Psych:  Alert and cooperative. Normal mood and affect. A and O x 3  Marcey Naval, MD [facility]

## 2023-11-10 NOTE — Progress Notes (Signed)
 Pt's states no medical or surgical changes since previsit or office visit.

## 2023-11-10 NOTE — Progress Notes (Signed)
 Called to room to assist during endoscopic procedure.  Patient ID and intended procedure confirmed with present staff. Received instructions for my participation in the procedure from the performing physician.

## 2023-11-13 ENCOUNTER — Ambulatory Visit: Admitting: Pulmonary Disease

## 2023-11-13 ENCOUNTER — Telehealth: Payer: Self-pay

## 2023-11-13 NOTE — Telephone Encounter (Signed)
  Follow up Call-     11/10/2023    7:16 AM 01/17/2022   10:10 AM  Call back number  Post procedure Call Back phone  # [phone]  Permission to leave phone message Yes Yes     Patient questions:  Do you have a fever, pain , or abdominal swelling? No. Pain Score  0 *  Have you tolerated food without any problems? Yes.    Have you been able to return to your normal activities? Yes.    Do you have any questions about your discharge instructions: Diet   No. Medications  No. Follow up visit  No.  Do you have questions or concerns about your Care? No.  Actions: * If pain score is 4 or above: No action needed, pain <4.

## 2023-11-14 LAB — SURGICAL PATHOLOGY

## 2023-11-15 ENCOUNTER — Ambulatory Visit: Payer: Self-pay | Admitting: Gastroenterology

## 2023-11-16 ENCOUNTER — Ambulatory Visit: Admitting: Pulmonary Disease

## 2023-11-16 VITALS — BP 138/73 | HR 71 | Ht 71.5 in | Wt 278.0 lb

## 2023-11-16 DIAGNOSIS — G4733 Obstructive sleep apnea (adult) (pediatric): Secondary | ICD-10-CM | POA: Diagnosis not present

## 2023-11-16 NOTE — Patient Instructions (Signed)
 I will see you back in a year  The download from the machine shows it is working well  We will send prescription to [facility] for CPAP supplies -They should be contacting you on a regular basis for your supplies  Call us  with significant concerns

## 2023-11-16 NOTE — Progress Notes (Signed)
 Joshua Langworthy Sr.    [id number]    [birth year]  Primary Care Physician:[name], Lynwood ORN, MD  Referring Physician: Norleen Lynwood ORN, MD [address] [place],  [place] [address]  Chief complaint:   History of moderate obstructive sleep apnea  HPI:  Continues to use his CPAP on a nightly basis  Recently had ankle surgery in February, did have issues with his wound that took a while to heal  Now starting rehab  Wakes up feeling refreshed Uses CPAP nightly  His weight has been stable  No unusual tiredness during the day of fatigue during the day  Sometimes does take naps during the day  No family history of obstructive sleep apnea  Outpatient Encounter Medications as of 11/16/2023  Medication Sig   acetaminophen  (TYLENOL  8 HOUR) 650 MG CR tablet 4 tablets twice a day by oral route.   amLODipine  (NORVASC ) 5 MG tablet Take 1 tablet (5 mg total) by mouth daily.   atorvastatin  (LIPITOR) 80 MG tablet Take 1 tablet (80 mg total) by mouth daily.   cetirizine  (ZYRTEC ) 10 MG tablet Take 10 mg by mouth daily.   Cholecalciferol (VITAMIN D3 PO) Take 5,000 Int'l Units by mouth.   Cholecalciferol 250 MCG (10000 UT) TABS    diclofenac  Sodium (PENNSAID ) 2 % SOLN    docusate sodium  (COLACE) 100 MG capsule Take 1 capsule (100 mg total) by mouth 2 (two) times daily. While taking narcotic pain medicine.   FISH OIL-KRILL OIL PO 500 mg.   furosemide  (LASIX ) 20 MG tablet 1 tab by mouth in the AM, and 1 in the PM as needed for persistent swelling   irbesartan  (AVAPRO ) 300 MG tablet Take 1 tablet (300 mg total) by mouth daily.   meloxicam  (MOBIC ) 7.5 MG tablet TAKE 1 TABLET BY MOUTH TWICE A DAY FOR 2 WEEKS AND THEN AS NEEDED   Multiple Vitamins-Minerals (MULTIVITAMINS THER. W/MINERALS) TABS tablet Take 1 tablet by mouth daily.   Na Sulfate-K Sulfate-Mg Sulfate concentrate (SUPREP) 17.5-3.13-1.6 GM/177ML SOLN Take 1 kit by mouth once.   omeprazole  (PRILOSEC) 40 MG capsule Take 40 mg by  mouth daily.   POLY-IRON  150 150 MG capsule TAKE 2 CAPSULES BY MOUTH EVERY DAY   Probiotic Product (PROBIOTIC DAILY PO) Take by mouth. [facility] colon [facility]   senna (SENOKOT) 8.6 MG TABS tablet Take 2 tablets (17.2 mg total) by mouth 2 (two) times daily.   tadalafil  (CIALIS ) 20 MG tablet Take 1 tablet (20 mg total) by mouth daily as needed.   tamsulosin  (FLOMAX ) 0.4 MG CAPS capsule Take 2 capsules (0.8 mg total) by mouth daily.   tiZANidine  (ZANAFLEX ) 2 MG tablet TAKE 1 TABLET BY MOUTH EVERYDAY AT BEDTIME   traMADol  (ULTRAM ) 50 MG tablet TAKE 1 TABLET BY MOUTH EVERY 6 HOURS AS NEEDED FOR MODERATE PAIN   Turmeric 1053 MG TABS turmeric   doxycycline  (VIBRA -TABS) 100 MG tablet Take 1 tablet (100 mg total) by mouth 2 (two) times daily. (Patient not taking: Reported on 11/16/2023)   No facility-administered encounter medications on file as of 11/16/2023.    Allergies as of 11/16/2023 - Review Complete 11/16/2023  Allergen Reaction Noted   Ace inhibitors Other (See Comments) 08/07/2007    Past Medical History:  Diagnosis Date   Acute pharyngitis 01/26/2009   ALLERGIC RHINITIS 08/07/2007   Allergy    ARTHRITIS 08/07/2007   BACK PAIN 09/15/2009   BENIGN PROSTATIC HYPERTROPHY 05/20/2008   Cataract  removed bilaterally    EATING DISORDER, HX OF 08/07/2007   ERECTILE DYSFUNCTION 02/18/2008   GERD (gastroesophageal reflux disease)    HYPERLIPIDEMIA 08/07/2007   HYPERTENSION 08/07/2007   NECK PAIN, RIGHT 09/15/2009   Neuromuscular disorder (HCC)    carpal tunnel    OSTEOARTHRITIS, KNEES, BILATERAL 09/15/2009   SINUSITIS- ACUTE-NOS 04/30/2010   Sleep apnea    wears cpap    Past Surgical History:  Procedure Laterality Date   ANKLE FUSION Right 04/27/2023   Procedure: SUBTALAR AND TALONAVICULAR ARTHRODESIS ANKLE;  Surgeon: Kit Rush, MD;  Location: [facility];  Service: Orthopedics;  Laterality: Right;   COLONOSCOPY     GASTRIC BYPASS  2006   GASTROCNEMIUS RECESSION  Right 04/27/2023   Procedure: GASTROCNEMIUS RECESSION;  Surgeon: Kit Rush, MD;  Location: [facility];  Service: Orthopedics;  Laterality: Right;   Left foot surgery  2008   Lower back  2004   POLYPECTOMY     Right knee arthroscopy  1994   UPPER GASTROINTESTINAL ENDOSCOPY      Family History  Problem Relation Age of Onset   Hyperlipidemia Father    Heart disease Father    Hypertension Father    Colon polyps Neg Hx    Esophageal cancer Neg Hx    Rectal cancer Neg Hx    Stomach cancer Neg Hx    Pancreatic cancer Neg Hx    Colon cancer Neg Hx     Social History   Socioeconomic History   Marital status: Married    Spouse name: Joshua Burnett   Number of children: 2   Years of education: college   Highest education level: Some college, no degree  Occupational History   Occupation: Event organiser  Tobacco Use   Smoking status: Never   Smokeless tobacco: Never  Vaping Use   Vaping status: Never Used  Substance and Sexual Activity   Alcohol use: Yes    Comment: occasional beer    Drug use: No   Sexual activity: Not Currently  Other Topics Concern   Not on file  Social History Narrative   Lives with wife   Social Drivers of Health   Financial Resource Strain: Low Risk  (07/26/2023)   Overall Financial Resource Strain (CARDIA)    Difficulty of Paying Living Expenses: Not hard at all  Food Insecurity: No Food Insecurity (07/26/2023)   Hunger Vital Sign    Worried About Running Out of Food in the Last Year: Never true    Ran Out of Food in the Last Year: Never true  Transportation Needs: No Transportation Needs (07/26/2023)   PRAPARE - Administrator, Civil Service (Medical): No    Lack of Transportation (Non-Medical): No  Physical Activity: Insufficiently Active (07/26/2023)   Exercise Vital Sign    Days of Exercise per Week: 7 days    Minutes of Exercise per Session: 20 min  Stress: No Stress Concern Present (07/26/2023)   [employer]  of Occupational Health - Occupational Stress Questionnaire    Feeling of Stress : Not at all  Social Connections: Socially Integrated (07/26/2023)   Social Connection and Isolation Panel    Frequency of Communication with Friends and Family: More than three times a week    Frequency of Social Gatherings with Friends and Family: Three times a week    Attends Religious Services: More than 4 times per year    Active Member of Clubs or Organizations: Yes    Attends Club or  Organization Meetings: More than 4 times per year    Marital Status: Married  Catering manager Violence: Not At Risk (01/30/2023)   Humiliation, Afraid, Rape, and Kick questionnaire    Fear of Current or Ex-Partner: No    Emotionally Abused: No    Physically Abused: No    Sexually Abused: No    Review of Systems  Respiratory:  Positive for apnea.   Psychiatric/Behavioral:  Positive for sleep disturbance.   All other systems reviewed and are negative.   Vitals:   11/16/23 1525  BP: 138/73  Pulse: 71  SpO2: 93%     Physical Exam Constitutional:      Appearance: He is obese.  HENT:     Head: Normocephalic.     Mouth/Throat:     Mouth: Mucous membranes are moist.     Comments: . Eyes:     General:        Right eye: No discharge.        Left eye: No discharge.     Pupils: Pupils are equal, round, and reactive to light.  Cardiovascular:     Rate and Rhythm: Normal rate and regular rhythm.     Pulses: Normal pulses.     Heart sounds: Normal heart sounds. No murmur heard.    No friction rub.  Pulmonary:     Effort: Pulmonary effort is normal. No respiratory distress.     Breath sounds: Normal breath sounds. No stridor. No wheezing or rhonchi.  Musculoskeletal:     Cervical back: No rigidity or tenderness.  Neurological:     General: No focal deficit present.     Mental Status: He is alert.  Psychiatric:        Mood and Affect: Mood normal.       01/06/2020    3:00 PM  Results of the Epworth  flowsheet  Sitting and reading 1  Watching TV 1  Sitting, inactive in a public place (e.g. a theatre or a meeting) 1  As a passenger in a car for an hour without a break 1  Lying down to rest in the afternoon when circumstances permit 1  Sitting and talking to someone 1  Sitting quietly after a lunch without alcohol 1  In a car, while stopped for a few minutes in traffic 0  Total score 7    Compliance data reviewed showing excellent compliance at 93% AutoSet 5-15 95 percentile pressure of 12.5 residual AHI 1.2  Assessment:  Moderate obstructive sleep apnea - Compliance is good  Benefiting from CPAP Tolerating CPAP well  Class II obesity   Plan/Recommendations: Continue CPAP  Weight loss measures  Follow-up a year from now  Encouraged to call with significant concerns  Placed order for CPAP supplies to DME   Jennet Epley MD [facility] 11/16/2023, 3:34 PM  CC: Norleen Lynwood ORN, MD

## 2023-12-04 ENCOUNTER — Encounter: Payer: Self-pay | Admitting: Pulmonary Disease

## 2023-12-04 DIAGNOSIS — G4733 Obstructive sleep apnea (adult) (pediatric): Secondary | ICD-10-CM

## 2023-12-21 ENCOUNTER — Other Ambulatory Visit: Payer: Self-pay | Admitting: Internal Medicine

## 2023-12-21 MED ORDER — TADALAFIL 20 MG PO TABS: 20.0000 mg | ORAL_TABLET | Freq: Every day | ORAL | 11 refills | Status: AC | PRN

## 2023-12-21 NOTE — Telephone Encounter (Signed)
 Copied from CRM [id number]. Topic: Clinical - Medication Refill >> Dec 21, 2023 11:16 AM Drema MATSU wrote: Medication: tadalafil  (CIALIS ) 20 MG tablet  Has the patient contacted their pharmacy? Yes (Agent: If no, request that the patient contact the pharmacy for the refill. If patient does not wish to contact the pharmacy document the reason why and proceed with request.) no more refills  (Agent: If yes, when and what did the pharmacy advise?)  This is the patient's preferred pharmacy:  [id number] - [place], [place] - [address] [place] [address] Phone: [phone] Fax: [phone]  Is this the correct pharmacy for this prescription? Yes If no, delete pharmacy and type the correct one.   Has the prescription been filled recently? Yes  Is the patient out of the medication? N/A  Has the patient been seen for an appointment in the last year OR does the patient have an upcoming appointment? Yes  Can we respond through MyChart? Yes  Agent: Please be advised that Rx refills may take up to 3 business days. We ask that you follow-up with your pharmacy.

## 2024-01-18 ENCOUNTER — Other Ambulatory Visit: Payer: Self-pay | Admitting: Internal Medicine

## 2024-01-18 MED ORDER — ATORVASTATIN CALCIUM 80 MG PO TABS: 80.0000 mg | ORAL_TABLET | Freq: Every day | ORAL | 3 refills | Status: AC

## 2024-01-18 MED ORDER — AMLODIPINE BESYLATE 5 MG PO TABS: 5.0000 mg | ORAL_TABLET | Freq: Every day | ORAL | 3 refills | Status: AC

## 2024-01-18 NOTE — Telephone Encounter (Signed)
 Copied from CRM #[id number]. Topic: Clinical - Medication Refill >> Jan 18, 2024  1:04 PM Deaijah H wrote: Medication: amLODipine  (NORVASC ) 5 MG tablet atorvastatin  (LIPITOR) 80 MG tablet    Has the patient contacted their pharmacy? Yes (Agent: If no, request that the patient contact the pharmacy for the refill. If patient does not wish to contact the pharmacy document the reason why and proceed with request.) (Agent: If yes, when and what did the pharmacy advise?)  This is the patient's preferred pharmacy:  [id number] - [place], [place] - [address] [place] [address] Phone: [phone] Fax: [phone]  Is this the correct pharmacy for this prescription? Yes If no, delete pharmacy and type the correct one.   Has the prescription been filled recently? Yes  Is the patient out of the medication? Yes  Has the patient been seen for an appointment in the last year OR does the patient have an upcoming appointment? Yes  Can we respond through MyChart? Yes  Agent: Please be advised that Rx refills may take up to 3 business days. We ask that you follow-up with your pharmacy.

## 2024-01-24 ENCOUNTER — Other Ambulatory Visit: Payer: Self-pay | Admitting: Internal Medicine

## 2024-01-26 ENCOUNTER — Ambulatory Visit: Admitting: Internal Medicine

## 2024-01-31 ENCOUNTER — Ambulatory Visit: Payer: Medicare Other

## 2024-01-31 VITALS — BP 138/80 | HR 79 | Ht 71.0 in | Wt 280.4 lb

## 2024-01-31 DIAGNOSIS — Z0000 Encounter for general adult medical examination without abnormal findings: Secondary | ICD-10-CM | POA: Diagnosis not present

## 2024-01-31 NOTE — Patient Instructions (Addendum)
 Joshua Burnett,  Thank you for taking the time for your Medicare Wellness Visit. I appreciate your continued commitment to your health goals. Please review the care plan we discussed, and feel free to reach out if I can assist you further.  Please note that Annual Wellness Visits do not include a physical exam. Some assessments may be limited, especially if the visit was conducted virtually. If needed, we may recommend an in-person follow-up with your provider.  Ongoing Care Seeing your primary care provider every 3 to 6 months helps us  monitor your health and provide consistent, personalized care.   Referrals If a referral was made during today's visit and you haven't received any updates within two weeks, please contact the referred provider directly to check on the status.  Recommended Screenings:  Health Maintenance  Topic Date Due   DTaP/Tdap/Td vaccine (2 - Td or Tdap) 12/20/2020   Flu Shot  10/06/2023   Medicare Annual Wellness Visit  01/30/2025   Colon Cancer Screening  11/09/2028   Pneumococcal Vaccine for age over 38  Completed   Hepatitis C Screening  Completed   Zoster (Shingles) Vaccine  Completed   Meningitis B Vaccine  Aged Out   COVID-19 Vaccine  Discontinued       04/27/2023    6:40 AM  Advanced Directives  Does Patient Have a Medical Advance Directive? No  Would patient like information on creating a medical advance directive? No - Patient declined    Vision: Annual vision screenings are recommended for early detection of glaucoma, cataracts, and diabetic retinopathy. These exams can also reveal signs of chronic conditions such as diabetes and high blood pressure.  Dental: Annual dental screenings help detect early signs of oral cancer, gum disease, and other conditions linked to overall health, including heart disease and diabetes.  Managing Pain Without Opioids Opioids are strong medicines used to treat moderate to severe pain. For some people, especially those  who have long-term (chronic) pain, opioids may not be the best choice for pain management due to: Side effects like nausea, constipation, and sleepiness. The risk of addiction (opioid use disorder). The longer you take opioids, the greater your risk of addiction. Pain that lasts for more than 3 months is called chronic pain. Managing chronic pain usually requires more than one approach and is often provided by a team of health care providers working together (multidisciplinary approach). Pain management may be done at a pain management center or pain clinic. How to manage pain without the use of opioids Use non-opioid medicines Non-opioid medicines for pain may include: Over-the-counter or prescription non-steroidal anti-inflammatory drugs (NSAIDs). These may be the first medicines used for pain. They work well for muscle and bone pain, and they reduce swelling. Acetaminophen . This over-the-counter medicine may work well for milder pain but not swelling. Antidepressants. These may be used to treat chronic pain. A certain type of antidepressant (tricyclics) is often used. These medicines are given in lower doses for pain than when used for depression. Anticonvulsants. These are usually used to treat seizures but may also reduce nerve (neuropathic) pain. Muscle relaxants. These relieve pain caused by sudden muscle tightening (spasms). You may also use a pain medicine that is applied to the skin as a patch, cream, or gel (topical analgesic), such as a numbing medicine. These may cause fewer side effects than medicines taken by mouth. Do certain therapies as directed Some therapies can help with pain management. They include: Physical therapy. You will do exercises to gain strength  and flexibility. A physical therapist may teach you exercises to move and stretch parts of your body that are weak, stiff, or painful. You can learn these exercises at physical therapy visits and practice them at home. Physical  therapy may also involve: Massage. Heat wraps or applying heat or cold to affected areas. Electrical signals that interrupt pain signals (transcutaneous electrical nerve stimulation, TENS). Weak lasers that reduce pain and swelling (low-level laser therapy). Signals from your body that help you learn to regulate pain (biofeedback). Occupational therapy. This helps you to learn ways to function at home and work with less pain. Recreational therapy. This involves trying new activities or hobbies, such as a physical activity or drawing. Mental health therapy, including: Cognitive behavioral therapy (CBT). This helps you learn coping skills for dealing with pain. Acceptance and commitment therapy (ACT) to change the way you think and react to pain. Relaxation therapies, including muscle relaxation exercises and mindfulness-based stress reduction. Pain management counseling. This may be individual, family, or group counseling.  Receive medical treatments Medical treatments for pain management include: Nerve block injections. These may include a pain blocker and anti-inflammatory medicines. You may have injections: Near the spine to relieve chronic back or neck pain. Into joints to relieve back or joint pain. Into nerve areas that supply a painful area to relieve body pain. Into muscles (trigger point injections) to relieve some painful muscle conditions. A medical device placed near your spine to help block pain signals and relieve nerve pain or chronic back pain (spinal cord stimulation device). Acupuncture. Follow these instructions at home Medicines Take over-the-counter and prescription medicines only as told by your health care provider. If you are taking pain medicine, ask your health care providers about possible side effects to watch out for. Do not drive or use heavy machinery while taking prescription opioid pain medicine. Lifestyle  Do not use drugs or alcohol to reduce pain. If  you drink alcohol, limit how much you have to: 0-1 drink a day for women who are not pregnant. 0-2 drinks a day for men. Know how much alcohol is in a drink. In the [place] one drink equals one 12 oz bottle of beer (355 mL), one 5 oz glass of wine (148 mL), or one 1 oz glass of hard liquor (44 mL). Do not use any products that contain nicotine or tobacco. These products include cigarettes, chewing tobacco, and vaping devices, such as e-cigarettes. If you need help quitting, ask your health care provider. Eat a healthy diet and maintain a healthy weight. Poor diet and excess weight may make pain worse. Eat foods that are high in fiber. These include fresh fruits and vegetables, whole grains, and beans. Limit foods that are high in fat and processed sugars, such as fried and sweet foods. Exercise regularly. Exercise lowers stress and may help relieve pain. Ask your health care provider what activities and exercises are safe for you. If your health care provider approves, join an exercise class that combines movement and stress reduction. Examples include yoga and tai chi. Get enough sleep. Lack of sleep may make pain worse. Lower stress as much as possible. Practice stress reduction techniques as told by your therapist. General instructions Work with all your pain management providers to find the treatments that work best for you. You are an important member of your pain management team. There are many things you can do to reduce pain on your own. Consider joining an online or in-person support group for people who  have chronic pain. Keep all follow-up visits. This is important. Where to find more information You can find more information about managing pain without opioids from: American Academy of Pain Medicine: painmed.org Institute for Chronic Pain: instituteforchronicpain.org American Chronic Pain Association: theacpa.org Contact a health care provider if: You have side effects from pain  medicine. Your pain gets worse or does not get better with treatments or home therapy. You are struggling with anxiety or depression. Summary Many types of pain can be managed without opioids. Chronic pain may respond better to pain management without opioids. Pain is best managed when you and a team of health care providers work together. Pain management without opioids may include non-opioid medicines, medical treatments, physical therapy, mental health therapy, and lifestyle changes. Tell your health care providers if your pain gets worse or is not being managed well enough. This information is not intended to replace advice given to you by your health care provider. Make sure you discuss any questions you have with your health care provider. Document Revised: 06/03/2020 Document Reviewed: 06/03/2020 Elsevier Patient Education  2024 [employer].

## 2024-01-31 NOTE — Progress Notes (Signed)
 Chief Complaint  Patient presents with   Medicare Wellness     Subjective:  Please attest and cosign this visit due to patients primary care provider not being in the office at the time the visit was completed.  (Pt of Dr Lynwood Rush)   Joshua Cathlyn Ligas Sr. is a 73 y.o. male who presents for a Medicare Annual Wellness Visit.  Allergies (verified) Ace inhibitors   History: Past Medical History:  Diagnosis Date   Acute pharyngitis 01/26/2009   ALLERGIC RHINITIS 08/07/2007   Allergy    ARTHRITIS 08/07/2007   BACK PAIN 09/15/2009   BENIGN PROSTATIC HYPERTROPHY 05/20/2008   Cataract    removed bilaterally    EATING DISORDER, HX OF 08/07/2007   ERECTILE DYSFUNCTION 02/18/2008   GERD (gastroesophageal reflux disease)    HYPERLIPIDEMIA 08/07/2007   HYPERTENSION 08/07/2007   NECK PAIN, RIGHT 09/15/2009   Neuromuscular disorder (HCC)    carpal tunnel    OSTEOARTHRITIS, KNEES, BILATERAL 09/15/2009   SINUSITIS- ACUTE-NOS 04/30/2010   Sleep apnea    wears cpap   Past Surgical History:  Procedure Laterality Date   ANKLE FUSION Right 04/27/2023   Procedure: SUBTALAR AND TALONAVICULAR ARTHRODESIS ANKLE;  Surgeon: Kit Rush, MD;  Location: [facility];  Service: Orthopedics;  Laterality: Right;   COLONOSCOPY     GASTRIC BYPASS  2006   GASTROCNEMIUS RECESSION Right 04/27/2023   Procedure: GASTROCNEMIUS RECESSION;  Surgeon: Kit Rush, MD;  Location: [facility];  Service: Orthopedics;  Laterality: Right;   Left foot surgery  2008   Lower back  2004   POLYPECTOMY     Right knee arthroscopy  1994   UPPER GASTROINTESTINAL ENDOSCOPY     Family History  Problem Relation Age of Onset   Hyperlipidemia Father    Heart disease Father    Hypertension Father    Colon polyps Neg Hx    Esophageal cancer Neg Hx    Rectal cancer Neg Hx    Stomach cancer Neg Hx    Pancreatic cancer Neg Hx    Colon cancer Neg Hx    Social History   Occupational  History   Occupation: Event organiser  Tobacco Use   Smoking status: Never   Smokeless tobacco: Never  Vaping Use   Vaping status: Never Used  Substance and Sexual Activity   Alcohol use: Yes    Alcohol/week: 3.0 standard drinks of alcohol    Types: 1 Glasses of wine, 1 Cans of beer, 1 Shots of liquor per week    Comment: occasional beer    Drug use: No   Sexual activity: Yes   Tobacco Counseling Counseling given: Not Answered  SDOH Screenings   Food Insecurity: No Food Insecurity (01/31/2024)  Housing: Unknown (01/31/2024)  Transportation Needs: No Transportation Needs (01/31/2024)  Utilities: Not At Risk (01/31/2024)  Alcohol Screen: Low Risk  (01/22/2024)  Depression (PHQ2-9): Low Risk  (01/31/2024)  Financial Resource Strain: Low Risk  (01/22/2024)  Physical Activity: Inactive (01/31/2024)  Social Connections: Socially Integrated (01/31/2024)  Stress: No Stress Concern Present (01/31/2024)  Tobacco Use: Low Risk  (01/31/2024)  Health Literacy: Adequate Health Literacy (01/31/2024)   See flowsheets for full screening details  Depression Screen PHQ 2 & 9 Depression Scale- Over the past 2 weeks, how often have you been bothered by any of the following problems? Little interest or pleasure in doing things: 0 Feeling down, depressed, or hopeless (PHQ Adolescent also includes...irritable): 0 PHQ-2 Total Score: 0 Trouble falling or  staying asleep, or sleeping too much: 0 Feeling tired or having little energy: 0 Poor appetite or overeating (PHQ Adolescent also includes...weight loss): 0 Feeling bad about yourself - or that you are a failure or have let yourself or your family down: 0 Trouble concentrating on things, such as reading the newspaper or watching television (PHQ Adolescent also includes...like school work): 0 Moving or speaking so slowly that other people could have noticed. Or the opposite - being so fidgety or restless that you have been moving around a lot  more than usual: 0 Thoughts that you would be better off dead, or of hurting yourself in some way: 0 PHQ-9 Total Score: 0 If you checked off any problems, how difficult have these problems made it for you to do your work, take care of things at home, or get along with other people?: Not difficult at all  Depression Treatment Depression Interventions/Treatment : [id number] Score <4 Follow-up Not Indicated     Goals Addressed               This Visit's Progress     Patient Stated (pt-stated)        Patient stated he plans to continue to stay active and is currently working       Visit info / Clinical Intake: Medicare Wellness Visit Type:: Subsequent Annual Wellness Visit Persons participating in visit:: patient Medicare Wellness Visit Mode:: In-person (required for WTM) Information given by:: patient Interpreter Needed?: No Pre-visit prep was completed: yes AWV questionnaire completed by patient prior to visit?: no Living arrangements:: lives with spouse/significant other Patient's Overall Health Status Rating: good Typical amount of pain: none Does pain affect daily life?: no Are you currently prescribed opioids?: (!) yes  Dietary Habits and Nutritional Risks How many meals a day?: 3 Eats fruit and vegetables daily?: yes Most meals are obtained by: preparing own meals; having others provide food; eating out In the last 2 weeks, have you had any of the following?: none Diabetic:: no  Functional Status Activities of Daily Living (to include ambulation/medication): Independent Ambulation: Independent with device- listed below Home Assistive Devices/Equipment: CPAP; Cane Medication Administration: Independent Home Management: Independent Manage your own finances?: yes Primary transportation is: driving Concerns about vision?: no *vision screening is required for WTM* Concerns about hearing?: no  Fall Screening Falls in the past year?: 0 Number of falls in past year:  0 Was there an injury with Fall?: 0 Fall Risk Category Calculator: 0 Patient Fall Risk Level: Low Fall Risk  Fall Risk Patient at Risk for Falls Due to: No Fall Risks Fall risk Follow up: Falls evaluation completed; Falls prevention discussed  Home and Transportation Safety: All rugs have non-skid backing?: N/A, no rugs All stairs or steps have railings?: yes Grab bars in the bathtub or shower?: (!) no Have non-skid surface in bathtub or shower?: yes Good home lighting?: yes Regular seat belt use?: yes Hospital stays in the last year:: no  Cognitive Assessment Difficulty concentrating, remembering, or making decisions? : no Will 6CIT or Mini Cog be Completed: yes What year is it?: 0 points What month is it?: 0 points Give patient an address phrase to remember (5 components): [address] [place], [place] About what time is it?: 0 points Count backwards from 20 to 1: 0 points Say the months of the year in reverse: 0 points Repeat the address phrase from earlier: 0 points 6 CIT Score: 0 points  Advance Directives (For Healthcare) Does Patient Have a Medical Advance  Directive?: Yes Does patient want to make changes to medical advance directive?: Yes (Inpatient - patient requests chaplain consult to change a medical advance directive) Type of Advance Directive: Healthcare Power of [place]; Living will Copy of Healthcare Power of Attorney in Chart?: No - copy requested Copy of Living Will in Chart?: No - copy requested Would patient like information on creating a medical advance directive?: No - Patient declined  Reviewed/Updated  Reviewed/Updated: Reviewed All (Medical, Surgical, Family, Medications, Allergies, Care Teams, Patient Goals)        Objective:    Today's Vitals   01/31/24 1504  BP: 138/80  Pulse: 79  SpO2: 98%  Weight: 280 lb 6.4 oz (127.2 kg)  Height: 5' 11 (1.803 m)   Body mass index is 39.11 kg/m.  Current Medications (verified) Outpatient  Encounter Medications as of 01/31/2024  Medication Sig   acetaminophen  (TYLENOL  8 HOUR) 650 MG CR tablet 4 tablets twice a day by oral route.   amLODipine  (NORVASC ) 5 MG tablet Take 1 tablet (5 mg total) by mouth daily.   atorvastatin  (LIPITOR) 80 MG tablet Take 1 tablet (80 mg total) by mouth daily.   cetirizine  (ZYRTEC ) 10 MG tablet Take 10 mg by mouth daily.   Cholecalciferol (VITAMIN D3 PO) Take 5,000 Int'l Units by mouth.   diclofenac  Sodium (PENNSAID ) 2 % SOLN    docusate sodium  (COLACE) 100 MG capsule Take 1 capsule (100 mg total) by mouth 2 (two) times daily. While taking narcotic pain medicine.   FISH OIL-KRILL OIL PO 500 mg.   furosemide  (LASIX ) 20 MG tablet 1 tab by mouth in the AM, and 1 in the PM as needed for persistent swelling   irbesartan  (AVAPRO ) 300 MG tablet Take 1 tablet (300 mg total) by mouth daily.   meloxicam  (MOBIC ) 7.5 MG tablet TAKE 1 TABLET BY MOUTH TWICE A DAY FOR 2 WEEKS AND THEN AS NEEDED   Multiple Vitamins-Minerals (MULTIVITAMINS THER. W/MINERALS) TABS tablet Take 1 tablet by mouth daily.   Na Sulfate-K Sulfate-Mg Sulfate concentrate (SUPREP) 17.5-3.13-1.6 GM/177ML SOLN Take 1 kit by mouth once.   omeprazole  (PRILOSEC) 40 MG capsule TAKE 1 CAPSULE(40 MG) BY MOUTH DAILY   POLY-IRON  150 150 MG capsule TAKE 2 CAPSULES BY MOUTH EVERY DAY   Probiotic Product (PROBIOTIC DAILY PO) Take by mouth. [facility] colon health   senna (SENOKOT) 8.6 MG TABS tablet Take 2 tablets (17.2 mg total) by mouth 2 (two) times daily.   tadalafil  (CIALIS ) 20 MG tablet Take 1 tablet (20 mg total) by mouth daily as needed.   tamsulosin  (FLOMAX ) 0.4 MG CAPS capsule Take 2 capsules (0.8 mg total) by mouth daily.   tiZANidine  (ZANAFLEX ) 2 MG tablet TAKE 1 TABLET BY MOUTH EVERYDAY AT BEDTIME   traMADol  (ULTRAM ) 50 MG tablet TAKE 1 TABLET BY MOUTH EVERY 6 HOURS AS NEEDED FOR MODERATE PAIN   Turmeric 1053 MG TABS turmeric   Cholecalciferol 250 MCG (10000 UT) TABS    doxycycline  (VIBRA -TABS) 100  MG tablet Take 1 tablet (100 mg total) by mouth 2 (two) times daily. (Patient not taking: Reported on 01/31/2024)   No facility-administered encounter medications on file as of 01/31/2024.   Hearing/Vision screen Hearing Screening - Comments:: Denies hearing difficulties   Vision Screening - Comments:: Wears eyeglasses for reading - up to date with routine eye exams with Dr Ozell Bertin Immunizations and Health Maintenance Health Maintenance  Topic Date Due   DTaP/Tdap/Td (2 - Td or Tdap) 12/20/2020   Influenza Vaccine  10/06/2023   Medicare Annual Wellness (AWV)  01/30/2025   Colonoscopy  11/09/2028   Pneumococcal Vaccine: 50+ Years  Completed   Hepatitis C Screening  Completed   Zoster Vaccines- Shingrix   Completed   Meningococcal B Vaccine  Aged Out   COVID-19 Vaccine  Discontinued        Assessment/Plan:  This is a routine wellness examination for Joshua Burnett.  Patient Care Team: Norleen Lynwood ORN, MD as PCP - General Patrcia Sharper, MD as Consulting Physician (Ophthalmology)  I have personally reviewed and noted the following in the patient's chart:   Medical and social history Use of alcohol, tobacco or illicit drugs  Current medications and supplements including opioid prescriptions. Functional ability and status Nutritional status Physical activity Advanced directives List of other physicians Hospitalizations, surgeries, and ER visits in previous 12 months Vitals Screenings to include cognitive, depression, and falls Referrals and appointments  No orders of the defined types were placed in this encounter.  In addition, I have reviewed and discussed with patient certain preventive protocols, quality metrics, and best practice recommendations. A written personalized care plan for preventive services as well as general preventive health recommendations were provided to patient.   Joshua Burnett, CMA   01/31/2024   Return in 1 year (on 01/30/2025).  After Visit  Summary: (In Person-Declined) Patient declined AVS at this time.  Nurse Notes: Scheduled 2026 AWV appt

## 2024-02-06 ENCOUNTER — Encounter: Payer: Self-pay | Admitting: Internal Medicine

## 2024-02-06 ENCOUNTER — Ambulatory Visit: Admitting: Internal Medicine

## 2024-02-06 ENCOUNTER — Other Ambulatory Visit: Payer: Self-pay | Admitting: Internal Medicine

## 2024-02-06 VITALS — BP 122/78 | HR 80 | Temp 97.9°F | Ht 71.0 in | Wt 277.0 lb

## 2024-02-06 DIAGNOSIS — E7800 Pure hypercholesterolemia, unspecified: Secondary | ICD-10-CM

## 2024-02-06 DIAGNOSIS — E559 Vitamin D deficiency, unspecified: Secondary | ICD-10-CM

## 2024-02-06 DIAGNOSIS — I10 Essential (primary) hypertension: Secondary | ICD-10-CM | POA: Diagnosis not present

## 2024-02-06 DIAGNOSIS — E611 Iron deficiency: Secondary | ICD-10-CM | POA: Diagnosis not present

## 2024-02-06 DIAGNOSIS — R739 Hyperglycemia, unspecified: Secondary | ICD-10-CM | POA: Diagnosis not present

## 2024-02-06 DIAGNOSIS — G5603 Carpal tunnel syndrome, bilateral upper limbs: Secondary | ICD-10-CM

## 2024-02-06 NOTE — Assessment & Plan Note (Signed)
 With recent worsening right > left, for hand surgury referral

## 2024-02-06 NOTE — Assessment & Plan Note (Signed)
 Lab Results  Component Value Date   LDLCALC 84 07/26/2023   Stable, pt to continue current statin lipitor 80 mg every day,

## 2024-02-06 NOTE — Patient Instructions (Signed)
 Please continue all other medications as before, and refills have been done if requested.  Please have the pharmacy call with any other refills you may need.  Please continue your efforts at being more active, low cholesterol diet, and weight control.  Please keep your appointments with your specialists as you may have planned  You will be contacted regarding the referral for: Dr Camella hand surgury  Please make an Appointment to return in 6 months, or sooner if needed

## 2024-02-06 NOTE — Assessment & Plan Note (Signed)
 Oct 2025 labs benign, for f/u GI as planned

## 2024-02-06 NOTE — Progress Notes (Signed)
 Patient ID: Joshua Cathlyn Ligas Sr., male   DOB: [birth year], 73 y.o.   MRN: [id number]        Chief Complaint: follow up right CTS, low vit d, iron  deficiency, hyperglycemia, hld, htn       HPI:  Joshua Cathlyn Ligas Sr. is a 73 y.o. male here with c/o mild to mod intermittent but gradually worsening right hand numbness, worse to lie down at night or grip the steering wheel.  Still taking iron  supplement per GI, has f/u in approx 3 months.  Pt denies chest pain, increased sob or doe, wheezing, orthopnea, PND, increased LE swelling, palpitations, dizziness or syncope.   Pt denies polydipsia, polyuria, or new focal neuro s/s.          Wt Readings from Last 3 Encounters:  02/06/24 277 lb (125.6 kg)  01/31/24 280 lb 6.4 oz (127.2 kg)  11/16/23 278 lb (126.1 kg)   BP Readings from Last 3 Encounters:  02/06/24 122/78  01/31/24 138/80  11/16/23 138/73         Past Medical History:  Diagnosis Date   Acute pharyngitis 01/26/2009   ALLERGIC RHINITIS 08/07/2007   Allergy    ARTHRITIS 08/07/2007   BACK PAIN 09/15/2009   BENIGN PROSTATIC HYPERTROPHY 05/20/2008   Cataract    removed bilaterally    EATING DISORDER, HX OF 08/07/2007   ERECTILE DYSFUNCTION 02/18/2008   GERD (gastroesophageal reflux disease)    HYPERLIPIDEMIA 08/07/2007   HYPERTENSION 08/07/2007   NECK PAIN, RIGHT 09/15/2009   Neuromuscular disorder (HCC)    carpal tunnel    OSTEOARTHRITIS, KNEES, BILATERAL 09/15/2009   SINUSITIS- ACUTE-NOS 04/30/2010   Sleep apnea    wears cpap   Past Surgical History:  Procedure Laterality Date   ANKLE FUSION Right 04/27/2023   Procedure: SUBTALAR AND TALONAVICULAR ARTHRODESIS ANKLE;  Surgeon: Kit Rush, MD;  Location: [facility];  Service: Orthopedics;  Laterality: Right;   COLONOSCOPY     GASTRIC BYPASS  2006   GASTROCNEMIUS RECESSION Right 04/27/2023   Procedure: GASTROCNEMIUS RECESSION;  Surgeon: Kit Rush, MD;  Location: [facility];  Service:  Orthopedics;  Laterality: Right;   Left foot surgery  2008   Lower back  2004   POLYPECTOMY     Right knee arthroscopy  1994   UPPER GASTROINTESTINAL ENDOSCOPY      reports that he has never smoked. He has never used smokeless tobacco. He reports current alcohol use of about 3.0 standard drinks of alcohol per week. He reports that he does not use drugs. family history includes Heart disease in his father; Hyperlipidemia in his father; Hypertension in his father. Allergies  Allergen Reactions   Ace Inhibitors Other (See Comments)    REACTION: tongue swelling   Current Outpatient Medications on File Prior to Visit  Medication Sig Dispense Refill   acetaminophen  (TYLENOL  8 HOUR) 650 MG CR tablet 4 tablets twice a day by oral route.     amLODipine  (NORVASC ) 5 MG tablet Take 1 tablet (5 mg total) by mouth daily. 90 tablet 3   atorvastatin  (LIPITOR) 80 MG tablet Take 1 tablet (80 mg total) by mouth daily. 90 tablet 3   cetirizine  (ZYRTEC ) 10 MG tablet Take 10 mg by mouth daily.     Cholecalciferol (VITAMIN D3 PO) Take 5,000 Int'l Units by mouth.     diclofenac  Sodium (PENNSAID ) 2 % SOLN      docusate sodium  (COLACE) 100 MG capsule Take 1 capsule (100 mg total) by  mouth 2 (two) times daily. While taking narcotic pain medicine. 30 capsule 0   doxycycline  (VIBRA -TABS) 100 MG tablet Take 1 tablet (100 mg total) by mouth 2 (two) times daily. 20 tablet 0   FISH OIL-KRILL OIL PO 500 mg.     furosemide  (LASIX ) 20 MG tablet 1 tab by mouth in the AM, and 1 in the PM as needed for persistent swelling 180 tablet 3   irbesartan  (AVAPRO ) 300 MG tablet Take 1 tablet (300 mg total) by mouth daily. 90 tablet 3   meloxicam  (MOBIC ) 7.5 MG tablet TAKE 1 TABLET BY MOUTH TWICE A DAY FOR 2 WEEKS AND THEN AS NEEDED     Multiple Vitamins-Minerals (MULTIVITAMINS THER. W/MINERALS) TABS tablet Take 1 tablet by mouth daily.     Na Sulfate-K Sulfate-Mg Sulfate concentrate (SUPREP) 17.5-3.13-1.6 GM/177ML SOLN Take 1 kit by  mouth once.     omeprazole  (PRILOSEC) 40 MG capsule TAKE 1 CAPSULE(40 MG) BY MOUTH DAILY 90 capsule 0   POLY-IRON  150 150 MG capsule TAKE 2 CAPSULES BY MOUTH EVERY DAY 180 capsule 3   Probiotic Product (PROBIOTIC DAILY PO) Take by mouth. [facility]     senna (SENOKOT) 8.6 MG TABS tablet Take 2 tablets (17.2 mg total) by mouth 2 (two) times daily. 30 tablet 0   tadalafil  (CIALIS ) 20 MG tablet Take 1 tablet (20 mg total) by mouth daily as needed. 10 tablet 11   tamsulosin  (FLOMAX ) 0.4 MG CAPS capsule Take 2 capsules (0.8 mg total) by mouth daily. 90 capsule 3   tiZANidine  (ZANAFLEX ) 2 MG tablet TAKE 1 TABLET BY MOUTH EVERYDAY AT BEDTIME 90 tablet 1   traMADol  (ULTRAM ) 50 MG tablet TAKE 1 TABLET BY MOUTH EVERY 6 HOURS AS NEEDED FOR MODERATE PAIN 120 tablet 2   Turmeric 1053 MG TABS turmeric     No current facility-administered medications on file prior to visit.        ROS:  All others reviewed and negative.  Objective        PE:  BP 122/78 (BP Location: Right Arm, Patient Position: Sitting, Cuff Size: Normal)   Pulse 80   Temp 97.9 F (36.6 C) (Oral)   Ht 5' 11 (1.803 m)   Wt 277 lb (125.6 kg)   SpO2 95%   BMI 38.63 kg/m                 Constitutional: Pt appears in NAD               HENT: Head: NCAT.                Right Ear: External ear normal.                 Left Ear: External ear normal.                Eyes: . Pupils are equal, round, and reactive to light. Conjunctivae and EOM are normal               Nose: without d/c or deformity               Neck: Neck supple. Gross normal ROM               Cardiovascular: Normal rate and regular rhythm.                 Pulmonary/Chest: Effort normal and breath sounds without rales or wheezing.  Abd:  Soft, NT, ND, + BS, no organomegaly               Neurological: Pt is alert. At baseline orientation, motor grossly intact               Skin: Skin is warm. No rashes, no other new lesions, LE edema - none                Psychiatric: Pt behavior is normal without agitation   Micro: none  Cardiac tracings I have personally interpreted today:  none  Pertinent Radiological findings (summarize): none   Lab Results  Component Value Date   WBC 7.1 10/12/2023   HGB 13.5 10/12/2023   HCT 40.5 10/12/2023   PLT 198.0 10/12/2023   GLUCOSE 85 07/26/2023   CHOL 157 07/26/2023   TRIG 85.0 07/26/2023   HDL 55.60 07/26/2023   LDLDIRECT 157.6 01/02/2009   LDLCALC 84 07/26/2023   ALT 19 07/26/2023   AST 19 07/26/2023   NA 140 07/26/2023   K 3.9 07/26/2023   CL 102 07/26/2023   CREATININE 0.80 07/26/2023   BUN 16 07/26/2023   CO2 31 07/26/2023   TSH 0.85 07/26/2023   PSA 2.95 07/26/2023   HGBA1C 6.0 07/26/2023   MICROALBUR <0.7 07/26/2023   Assessment/Plan:  Joshua Cathlyn Ligas Sr. is a 73 y.o. Black or African American [2] male with  has a past medical history of Acute pharyngitis (01/26/2009), ALLERGIC RHINITIS (08/07/2007), Allergy, ARTHRITIS (08/07/2007), BACK PAIN (09/15/2009), BENIGN PROSTATIC HYPERTROPHY (05/20/2008), Cataract, EATING DISORDER, HX OF (08/07/2007), ERECTILE DYSFUNCTION (02/18/2008), GERD (gastroesophageal reflux disease), HYPERLIPIDEMIA (08/07/2007), HYPERTENSION (08/07/2007), NECK PAIN, RIGHT (09/15/2009), Neuromuscular disorder (HCC), OSTEOARTHRITIS, KNEES, BILATERAL (09/15/2009), SINUSITIS- ACUTE-NOS (04/30/2010), and Sleep apnea.  Vitamin D  deficiency Last vitamin D  Lab Results  Component Value Date   VD25OH 35.35 07/26/2023   Low, to start oral replacement   Iron  deficiency Oct 2025 labs benign, for f/u GI as planned  Hyperglycemia Lab Results  Component Value Date   HGBA1C 6.0 07/26/2023   Stable, pt to continue current medical treatment  - diet, wt control   HLD (hyperlipidemia) Lab Results  Component Value Date   LDLCALC 84 07/26/2023   Stable, pt to continue current statin lipitor 80 mg every day,    Bilateral carpal tunnel syndrome With recent  worsening right > left, for hand surgury referral  Essential hypertension BP Readings from Last 3 Encounters:  02/06/24 122/78  01/31/24 138/80  11/16/23 138/73   Stable, pt to continue medical treatment norvasc  5 every day, avapro  300 qd  Followup: No follow-ups on file.  Lynwood Rush, MD 02/06/2024 4:44 PM [facility]

## 2024-02-06 NOTE — Assessment & Plan Note (Signed)
 Last vitamin D  Lab Results  Component Value Date   VD25OH 35.35 07/26/2023   Low, to start oral replacement

## 2024-02-06 NOTE — Assessment & Plan Note (Signed)
 Lab Results  Component Value Date   HGBA1C 6.0 07/26/2023   Stable, pt to continue current medical treatment  - diet, wt control

## 2024-02-06 NOTE — Assessment & Plan Note (Signed)
 BP Readings from Last 3 Encounters:  02/06/24 122/78  01/31/24 138/80  11/16/23 138/73   Stable, pt to continue medical treatment norvasc  5 every day, avapro  300 qd

## 2024-03-17 ENCOUNTER — Encounter: Payer: Self-pay | Admitting: Internal Medicine

## 2025-02-04 ENCOUNTER — Ambulatory Visit
